# Patient Record
Sex: Male | Born: 1950 | Race: Black or African American | Hispanic: No | Marital: Married | Smoking: Current every day smoker
Health system: Southern US, Community
[De-identification: ages and names within clinical notes are randomized; demographics above are authoritative.]

## PROBLEM LIST (undated history)

## (undated) DIAGNOSIS — R9430 Abnormal result of cardiovascular function study, unspecified: Secondary | ICD-10-CM

## (undated) DIAGNOSIS — E785 Hyperlipidemia, unspecified: Secondary | ICD-10-CM

## (undated) DIAGNOSIS — I700 Atherosclerosis of aorta: Secondary | ICD-10-CM

## (undated) DIAGNOSIS — Z8619 Personal history of other infectious and parasitic diseases: Secondary | ICD-10-CM

## (undated) DIAGNOSIS — I119 Hypertensive heart disease without heart failure: Secondary | ICD-10-CM

## (undated) HISTORY — DX: Hypertensive heart disease without heart failure: I11.9

## (undated) HISTORY — DX: Hyperlipidemia, unspecified: E78.5

## (undated) HISTORY — DX: Atherosclerosis of aorta: I70.0

## (undated) HISTORY — DX: Abnormal result of cardiovascular function study, unspecified: R94.30

## (undated) HISTORY — DX: Personal history of other infectious and parasitic diseases: Z86.19

## (undated) HISTORY — PX: TONSILLECTOMY: SUR1361

---

## 2006-05-01 ENCOUNTER — Ambulatory Visit: Payer: Self-pay | Admitting: Family Medicine

## 2006-05-02 ENCOUNTER — Ambulatory Visit: Payer: Self-pay | Admitting: *Deleted

## 2006-05-22 ENCOUNTER — Ambulatory Visit: Payer: Self-pay | Admitting: Family Medicine

## 2006-07-29 ENCOUNTER — Ambulatory Visit: Payer: Self-pay | Admitting: Internal Medicine

## 2006-11-26 ENCOUNTER — Ambulatory Visit: Payer: Self-pay | Admitting: Family Medicine

## 2006-11-26 ENCOUNTER — Encounter (INDEPENDENT_AMBULATORY_CARE_PROVIDER_SITE_OTHER): Payer: Self-pay | Admitting: Family Medicine

## 2006-11-26 LAB — CONVERTED CEMR LAB
Chloride: 103 meq/L (ref 96–112)
Glucose, Bld: 96 mg/dL (ref 70–99)
Sodium: 140 meq/L (ref 135–145)

## 2007-03-14 ENCOUNTER — Ambulatory Visit: Payer: Self-pay | Admitting: Internal Medicine

## 2007-03-14 ENCOUNTER — Encounter (INDEPENDENT_AMBULATORY_CARE_PROVIDER_SITE_OTHER): Payer: Self-pay | Admitting: Family Medicine

## 2007-03-14 LAB — CONVERTED CEMR LAB
ALT: 37 units/L (ref 0–53)
AST: 63 units/L — ABNORMAL HIGH (ref 0–37)
Alkaline Phosphatase: 50 units/L (ref 39–117)
Basophils Absolute: 0 10*3/uL (ref 0.0–0.1)
CO2: 28 meq/L (ref 19–32)
Chloride: 103 meq/L (ref 96–112)
Creatinine, Ser: 1.3 mg/dL (ref 0.40–1.50)
Glucose, Bld: 90 mg/dL (ref 70–99)
HCT: 43.5 % (ref 39.0–52.0)
Hemoglobin: 14.3 g/dL (ref 13.0–17.0)
Lymphocytes Relative: 35 % (ref 12–46)
MCHC: 32.9 g/dL (ref 30.0–36.0)
MCV: 97.5 fL (ref 78.0–100.0)
Neutro Abs: 2.8 10*3/uL (ref 1.7–7.7)
RBC: 4.46 M/uL (ref 4.22–5.81)
Sodium: 143 meq/L (ref 135–145)
Total Bilirubin: 0.7 mg/dL (ref 0.3–1.2)
Total Protein: 8.6 g/dL — ABNORMAL HIGH (ref 6.0–8.3)
WBC: 5.2 10*3/uL (ref 4.0–10.5)

## 2007-06-30 ENCOUNTER — Ambulatory Visit: Payer: Self-pay | Admitting: Internal Medicine

## 2008-01-01 ENCOUNTER — Ambulatory Visit: Payer: Self-pay | Admitting: Internal Medicine

## 2008-01-01 ENCOUNTER — Encounter (INDEPENDENT_AMBULATORY_CARE_PROVIDER_SITE_OTHER): Payer: Self-pay | Admitting: Adult Health

## 2008-01-01 LAB — CONVERTED CEMR LAB
Bilirubin, Direct: 0.1 mg/dL (ref 0.0–0.3)
HDL: 39 mg/dL — ABNORMAL LOW (ref >39)
Indirect Bilirubin: 0.4 mg/dL (ref 0.0–0.9)
LDL Cholesterol: 70 mg/dL (ref 0–99)
Total CHOL/HDL Ratio: 3.3
Total Protein: 8 g/dL (ref 6.0–8.3)
Triglycerides: 99 mg/dL (ref <150)

## 2008-01-09 ENCOUNTER — Ambulatory Visit: Payer: Self-pay | Admitting: Internal Medicine

## 2008-04-07 ENCOUNTER — Encounter (INDEPENDENT_AMBULATORY_CARE_PROVIDER_SITE_OTHER): Payer: Self-pay | Admitting: Adult Health

## 2008-04-07 ENCOUNTER — Ambulatory Visit: Payer: Self-pay | Admitting: Internal Medicine

## 2008-04-07 LAB — CONVERTED CEMR LAB
ALT: 58 units/L — ABNORMAL HIGH (ref 0–53)
AST: 113 units/L — ABNORMAL HIGH (ref 0–37)
Alkaline Phosphatase: 60 units/L (ref 39–117)
BUN: 17 mg/dL (ref 6–23)
Calcium: 9.2 mg/dL (ref 8.4–10.5)
Chloride: 104 meq/L (ref 96–112)
Glucose, Bld: 95 mg/dL (ref 70–99)
Potassium: 4.8 meq/L (ref 3.5–5.3)
Sodium: 141 meq/L (ref 135–145)
Total Bilirubin: 0.4 mg/dL (ref 0.3–1.2)
Triglycerides: 223 mg/dL — ABNORMAL HIGH (ref <150)
VLDL: 45 mg/dL — ABNORMAL HIGH (ref 0–40)

## 2008-04-28 ENCOUNTER — Ambulatory Visit: Payer: Self-pay | Admitting: Internal Medicine

## 2008-05-06 ENCOUNTER — Ambulatory Visit: Payer: Self-pay | Admitting: Internal Medicine

## 2008-07-05 ENCOUNTER — Ambulatory Visit: Payer: Self-pay | Admitting: Internal Medicine

## 2008-10-06 ENCOUNTER — Ambulatory Visit: Payer: Self-pay | Admitting: Internal Medicine

## 2008-10-06 ENCOUNTER — Encounter (INDEPENDENT_AMBULATORY_CARE_PROVIDER_SITE_OTHER): Payer: Self-pay | Admitting: Adult Health

## 2008-10-06 LAB — CONVERTED CEMR LAB
ALT: 49 units/L (ref 0–53)
AST: 101 units/L — ABNORMAL HIGH (ref 0–37)
Alkaline Phosphatase: 50 units/L (ref 39–117)
BUN: 16 mg/dL (ref 6–23)
CO2: 26 meq/L (ref 19–32)
Chloride: 102 meq/L (ref 96–112)
Hemoglobin: 14.1 g/dL (ref 13.0–17.0)
Lymphs Abs: 1.9 10*3/uL (ref 0.7–4.0)
MCV: 97.3 fL (ref 78.0–100.0)
Monocytes Relative: 11 % (ref 3–12)
Neutrophils Relative %: 44 % (ref 43–77)
Platelets: 212 10*3/uL (ref 150–400)
Potassium: 4.5 meq/L (ref 3.5–5.3)
RBC: 4.38 M/uL (ref 4.22–5.81)
RDW: 14.1 % (ref 11.5–15.5)
Sodium: 138 meq/L (ref 135–145)
Total Protein: 8.1 g/dL (ref 6.0–8.3)
Triglycerides: 117 mg/dL (ref <150)
VLDL: 23 mg/dL (ref 0–40)
WBC: 4.4 10*3/uL (ref 4.0–10.5)

## 2008-10-27 ENCOUNTER — Ambulatory Visit: Payer: Self-pay | Admitting: Internal Medicine

## 2009-02-11 ENCOUNTER — Ambulatory Visit: Payer: Self-pay | Admitting: Internal Medicine

## 2009-02-11 ENCOUNTER — Encounter (INDEPENDENT_AMBULATORY_CARE_PROVIDER_SITE_OTHER): Payer: Self-pay | Admitting: Adult Health

## 2009-02-11 LAB — CONVERTED CEMR LAB
Alkaline Phosphatase: 56 units/L (ref 39–117)
BUN: 17 mg/dL (ref 6–23)
CO2: 26 meq/L (ref 19–32)
Chloride: 103 meq/L (ref 96–112)
Cholesterol: 194 mg/dL (ref 0–200)
Creatinine, Ser: 1.25 mg/dL (ref 0.40–1.50)
Glucose, Bld: 94 mg/dL (ref 70–99)
HDL: 36 mg/dL — ABNORMAL LOW (ref >39)
Hgb A1c MFr Bld: 6.6 % — ABNORMAL HIGH (ref 4.6–6.1)
LDL Cholesterol: 132 mg/dL — ABNORMAL HIGH (ref 0–99)
Sodium: 139 meq/L (ref 135–145)
Total Bilirubin: 0.7 mg/dL (ref 0.3–1.2)
Total CHOL/HDL Ratio: 5.4
Triglycerides: 131 mg/dL (ref <150)

## 2009-02-28 ENCOUNTER — Ambulatory Visit (HOSPITAL_COMMUNITY): Admission: RE | Admit: 2009-02-28 | Discharge: 2009-02-28 | Payer: Self-pay | Admitting: Internal Medicine

## 2009-03-01 ENCOUNTER — Ambulatory Visit: Payer: Self-pay | Admitting: Internal Medicine

## 2009-03-18 ENCOUNTER — Ambulatory Visit: Payer: Self-pay | Admitting: Internal Medicine

## 2009-06-17 ENCOUNTER — Encounter (INDEPENDENT_AMBULATORY_CARE_PROVIDER_SITE_OTHER): Payer: Self-pay | Admitting: Adult Health

## 2009-06-17 ENCOUNTER — Ambulatory Visit: Payer: Self-pay | Admitting: Internal Medicine

## 2009-06-17 LAB — CONVERTED CEMR LAB
ALT: 99 units/L — ABNORMAL HIGH (ref 0–53)
Albumin: 4.1 g/dL (ref 3.5–5.2)
BUN: 17 mg/dL (ref 6–23)
Cholesterol: 199 mg/dL (ref 0–200)
Creatinine, Ser: 1.36 mg/dL (ref 0.40–1.50)
Glucose, Bld: 97 mg/dL (ref 70–99)
HDL: 41 mg/dL (ref >39)
LDL Cholesterol: 131 mg/dL — ABNORMAL HIGH (ref 0–99)
Microalb, Ur: 0.51 mg/dL (ref 0.00–1.89)
PSA: 0.54 ng/mL (ref 0.10–4.00)
Potassium: 4.3 meq/L (ref 3.5–5.3)
Sodium: 136 meq/L (ref 135–145)
Total Bilirubin: 0.8 mg/dL (ref 0.3–1.2)
Triglycerides: 136 mg/dL (ref <150)
VLDL: 27 mg/dL (ref 0–40)

## 2009-06-28 ENCOUNTER — Ambulatory Visit: Payer: Self-pay | Admitting: Internal Medicine

## 2009-06-30 ENCOUNTER — Ambulatory Visit: Payer: Self-pay | Admitting: Family Medicine

## 2009-06-30 ENCOUNTER — Encounter (INDEPENDENT_AMBULATORY_CARE_PROVIDER_SITE_OTHER): Payer: Self-pay | Admitting: Family Medicine

## 2009-06-30 LAB — CONVERTED CEMR LAB
Basophils Absolute: 0 10*3/uL (ref 0.0–0.1)
Basophils Relative: 0 % (ref 0–1)
Eosinophils Relative: 0 % (ref 0–5)
Lymphocytes Relative: 33 % (ref 12–46)
Lymphs Abs: 1.8 10*3/uL (ref 0.7–4.0)
MCHC: 34.2 g/dL (ref 30.0–36.0)
Monocytes Relative: 9 % (ref 3–12)
WBC: 5.3 10*3/uL (ref 4.0–10.5)

## 2010-04-04 ENCOUNTER — Encounter (INDEPENDENT_AMBULATORY_CARE_PROVIDER_SITE_OTHER): Payer: Self-pay | Admitting: *Deleted

## 2010-04-04 LAB — CONVERTED CEMR LAB
ALT: 102 units/L — ABNORMAL HIGH (ref 0–53)
Alkaline Phosphatase: 49 units/L (ref 39–117)
BUN: 17 mg/dL (ref 6–23)
CO2: 26 meq/L (ref 19–32)
Cholesterol: 196 mg/dL (ref 0–200)
Creatinine, Ser: 1.19 mg/dL (ref 0.40–1.50)
Glucose, Bld: 95 mg/dL (ref 70–99)
Sodium: 138 meq/L (ref 135–145)
Total Bilirubin: 0.8 mg/dL (ref 0.3–1.2)
Triglycerides: 101 mg/dL (ref <150)
VLDL: 20 mg/dL (ref 0–40)

## 2012-09-08 ENCOUNTER — Ambulatory Visit: Payer: Self-pay | Admitting: Internal Medicine

## 2012-09-08 DIAGNOSIS — Z0289 Encounter for other administrative examinations: Secondary | ICD-10-CM

## 2014-08-04 ENCOUNTER — Other Ambulatory Visit (HOSPITAL_COMMUNITY): Payer: Self-pay | Admitting: Nurse Practitioner

## 2014-08-04 DIAGNOSIS — B182 Chronic viral hepatitis C: Secondary | ICD-10-CM

## 2014-10-20 ENCOUNTER — Ambulatory Visit (HOSPITAL_COMMUNITY)
Admission: RE | Admit: 2014-10-20 | Discharge: 2014-10-20 | Disposition: A | Discharge status: Home / Self Care | Payer: BLUE CROSS/BLUE SHIELD | Source: Ambulatory Visit | Attending: Nurse Practitioner | Admitting: Nurse Practitioner

## 2014-10-20 DIAGNOSIS — B182 Chronic viral hepatitis C: Secondary | ICD-10-CM | POA: Diagnosis not present

## 2014-11-08 DIAGNOSIS — B182 Chronic viral hepatitis C: Secondary | ICD-10-CM | POA: Diagnosis not present

## 2014-11-08 DIAGNOSIS — K7469 Other cirrhosis of liver: Secondary | ICD-10-CM | POA: Diagnosis not present

## 2014-12-03 DIAGNOSIS — F17210 Nicotine dependence, cigarettes, uncomplicated: Secondary | ICD-10-CM | POA: Diagnosis not present

## 2014-12-03 DIAGNOSIS — E785 Hyperlipidemia, unspecified: Secondary | ICD-10-CM | POA: Diagnosis not present

## 2014-12-03 DIAGNOSIS — Z1389 Encounter for screening for other disorder: Secondary | ICD-10-CM | POA: Diagnosis not present

## 2014-12-03 DIAGNOSIS — Z23 Encounter for immunization: Secondary | ICD-10-CM | POA: Diagnosis not present

## 2014-12-03 DIAGNOSIS — N183 Chronic kidney disease, stage 3 (moderate): Secondary | ICD-10-CM | POA: Diagnosis not present

## 2014-12-03 DIAGNOSIS — I129 Hypertensive chronic kidney disease with stage 1 through stage 4 chronic kidney disease, or unspecified chronic kidney disease: Secondary | ICD-10-CM | POA: Diagnosis not present

## 2014-12-03 DIAGNOSIS — M109 Gout, unspecified: Secondary | ICD-10-CM | POA: Diagnosis not present

## 2014-12-03 DIAGNOSIS — B1920 Unspecified viral hepatitis C without hepatic coma: Secondary | ICD-10-CM | POA: Diagnosis not present

## 2015-01-08 DIAGNOSIS — B182 Chronic viral hepatitis C: Secondary | ICD-10-CM | POA: Diagnosis not present

## 2015-03-05 DIAGNOSIS — B182 Chronic viral hepatitis C: Secondary | ICD-10-CM | POA: Diagnosis not present

## 2015-04-01 ENCOUNTER — Other Ambulatory Visit: Payer: Self-pay | Admitting: Nurse Practitioner

## 2015-04-01 DIAGNOSIS — K7469 Other cirrhosis of liver: Secondary | ICD-10-CM

## 2015-04-08 ENCOUNTER — Ambulatory Visit
Admission: RE | Admit: 2015-04-08 | Discharge: 2015-04-08 | Disposition: A | Discharge status: Home / Self Care | Payer: BLUE CROSS/BLUE SHIELD | Source: Ambulatory Visit | Attending: Nurse Practitioner | Admitting: Nurse Practitioner

## 2015-04-08 DIAGNOSIS — K7469 Other cirrhosis of liver: Secondary | ICD-10-CM

## 2015-04-08 DIAGNOSIS — K8020 Calculus of gallbladder without cholecystitis without obstruction: Secondary | ICD-10-CM | POA: Diagnosis not present

## 2015-05-27 DIAGNOSIS — B182 Chronic viral hepatitis C: Secondary | ICD-10-CM | POA: Diagnosis not present

## 2015-05-31 DIAGNOSIS — B1920 Unspecified viral hepatitis C without hepatic coma: Secondary | ICD-10-CM | POA: Diagnosis not present

## 2015-05-31 DIAGNOSIS — Z1211 Encounter for screening for malignant neoplasm of colon: Secondary | ICD-10-CM | POA: Diagnosis not present

## 2015-05-31 DIAGNOSIS — K7460 Unspecified cirrhosis of liver: Secondary | ICD-10-CM | POA: Diagnosis not present

## 2015-05-31 DIAGNOSIS — I10 Essential (primary) hypertension: Secondary | ICD-10-CM | POA: Diagnosis not present

## 2015-06-22 DIAGNOSIS — Z01818 Encounter for other preprocedural examination: Secondary | ICD-10-CM | POA: Diagnosis not present

## 2015-06-22 DIAGNOSIS — K7460 Unspecified cirrhosis of liver: Secondary | ICD-10-CM | POA: Diagnosis not present

## 2015-06-22 DIAGNOSIS — B1920 Unspecified viral hepatitis C without hepatic coma: Secondary | ICD-10-CM | POA: Diagnosis not present

## 2015-06-22 DIAGNOSIS — Z1211 Encounter for screening for malignant neoplasm of colon: Secondary | ICD-10-CM | POA: Diagnosis not present

## 2015-07-05 DIAGNOSIS — K3189 Other diseases of stomach and duodenum: Secondary | ICD-10-CM | POA: Diagnosis not present

## 2015-07-05 DIAGNOSIS — K5730 Diverticulosis of large intestine without perforation or abscess without bleeding: Secondary | ICD-10-CM | POA: Diagnosis not present

## 2015-07-05 DIAGNOSIS — Z13810 Encounter for screening for upper gastrointestinal disorder: Secondary | ICD-10-CM | POA: Diagnosis not present

## 2015-07-05 DIAGNOSIS — I8500 Esophageal varices without bleeding: Secondary | ICD-10-CM | POA: Diagnosis not present

## 2015-07-05 DIAGNOSIS — K766 Portal hypertension: Secondary | ICD-10-CM | POA: Diagnosis not present

## 2015-07-05 DIAGNOSIS — D122 Benign neoplasm of ascending colon: Secondary | ICD-10-CM | POA: Diagnosis not present

## 2015-07-05 DIAGNOSIS — Z1211 Encounter for screening for malignant neoplasm of colon: Secondary | ICD-10-CM | POA: Diagnosis not present

## 2015-07-05 DIAGNOSIS — D120 Benign neoplasm of cecum: Secondary | ICD-10-CM | POA: Diagnosis not present

## 2015-07-06 DIAGNOSIS — I252 Old myocardial infarction: Secondary | ICD-10-CM | POA: Diagnosis not present

## 2015-07-06 DIAGNOSIS — N183 Chronic kidney disease, stage 3 (moderate): Secondary | ICD-10-CM | POA: Diagnosis not present

## 2015-07-06 DIAGNOSIS — E785 Hyperlipidemia, unspecified: Secondary | ICD-10-CM | POA: Diagnosis not present

## 2015-07-06 DIAGNOSIS — Z23 Encounter for immunization: Secondary | ICD-10-CM | POA: Diagnosis not present

## 2015-07-06 DIAGNOSIS — I2510 Atherosclerotic heart disease of native coronary artery without angina pectoris: Secondary | ICD-10-CM | POA: Diagnosis not present

## 2015-07-06 DIAGNOSIS — I129 Hypertensive chronic kidney disease with stage 1 through stage 4 chronic kidney disease, or unspecified chronic kidney disease: Secondary | ICD-10-CM | POA: Diagnosis not present

## 2015-07-06 DIAGNOSIS — B1920 Unspecified viral hepatitis C without hepatic coma: Secondary | ICD-10-CM | POA: Diagnosis not present

## 2015-08-30 DIAGNOSIS — I2510 Atherosclerotic heart disease of native coronary artery without angina pectoris: Secondary | ICD-10-CM | POA: Diagnosis not present

## 2015-09-08 DIAGNOSIS — I2510 Atherosclerotic heart disease of native coronary artery without angina pectoris: Secondary | ICD-10-CM | POA: Diagnosis not present

## 2015-09-08 DIAGNOSIS — N183 Chronic kidney disease, stage 3 (moderate): Secondary | ICD-10-CM | POA: Diagnosis not present

## 2015-09-08 DIAGNOSIS — E785 Hyperlipidemia, unspecified: Secondary | ICD-10-CM | POA: Diagnosis not present

## 2015-09-08 DIAGNOSIS — I119 Hypertensive heart disease without heart failure: Secondary | ICD-10-CM | POA: Diagnosis not present

## 2015-09-08 DIAGNOSIS — R9431 Abnormal electrocardiogram [ECG] [EKG]: Secondary | ICD-10-CM | POA: Diagnosis not present

## 2015-09-12 DIAGNOSIS — R9431 Abnormal electrocardiogram [ECG] [EKG]: Secondary | ICD-10-CM | POA: Diagnosis not present

## 2015-09-16 ENCOUNTER — Other Ambulatory Visit: Payer: Self-pay | Admitting: Cardiology

## 2015-09-16 ENCOUNTER — Ambulatory Visit
Admission: RE | Admit: 2015-09-16 | Discharge: 2015-09-16 | Disposition: A | Discharge status: Home / Self Care | Payer: BLUE CROSS/BLUE SHIELD | Source: Ambulatory Visit | Attending: Cardiology | Admitting: Cardiology

## 2015-09-16 ENCOUNTER — Encounter: Payer: Self-pay | Admitting: Cardiology

## 2015-09-16 DIAGNOSIS — R079 Chest pain, unspecified: Secondary | ICD-10-CM | POA: Diagnosis not present

## 2015-09-16 DIAGNOSIS — I700 Atherosclerosis of aorta: Secondary | ICD-10-CM

## 2015-09-16 DIAGNOSIS — R0602 Shortness of breath: Secondary | ICD-10-CM | POA: Diagnosis not present

## 2015-09-16 DIAGNOSIS — R9430 Abnormal result of cardiovascular function study, unspecified: Secondary | ICD-10-CM | POA: Insufficient documentation

## 2015-09-16 DIAGNOSIS — N183 Chronic kidney disease, stage 3 (moderate): Secondary | ICD-10-CM | POA: Diagnosis not present

## 2015-09-16 DIAGNOSIS — Z01810 Encounter for preprocedural cardiovascular examination: Secondary | ICD-10-CM | POA: Diagnosis not present

## 2015-09-16 DIAGNOSIS — I119 Hypertensive heart disease without heart failure: Secondary | ICD-10-CM | POA: Insufficient documentation

## 2015-09-16 DIAGNOSIS — I2510 Atherosclerotic heart disease of native coronary artery without angina pectoris: Secondary | ICD-10-CM | POA: Diagnosis not present

## 2015-09-16 DIAGNOSIS — R9431 Abnormal electrocardiogram [ECG] [EKG]: Secondary | ICD-10-CM | POA: Diagnosis not present

## 2015-09-16 DIAGNOSIS — E785 Hyperlipidemia, unspecified: Secondary | ICD-10-CM

## 2015-09-16 DIAGNOSIS — Z8619 Personal history of other infectious and parasitic diseases: Secondary | ICD-10-CM

## 2015-09-16 HISTORY — DX: Atherosclerosis of aorta: I70.0

## 2015-09-16 HISTORY — DX: Personal history of other infectious and parasitic diseases: Z86.19

## 2015-09-16 HISTORY — DX: Abnormal result of cardiovascular function study, unspecified: R94.30

## 2015-09-16 HISTORY — DX: Hyperlipidemia, unspecified: E78.5

## 2015-09-16 HISTORY — DX: Hypertensive heart disease without heart failure: I11.9

## 2015-09-16 NOTE — Progress Notes (Signed)
Keith Nguyen    Date of visit:  09/16/2015 DOB:  1950-12-20    Age:  65 yrs. Medical record number:  [id number]     Account number:  [id number] Primary Care Provider: [name] ____________________________ CURRENT DIAGNOSES  1. Abnormal EKG  2. Encounter for preprocedural cardiovascular examination  3. Hypertensive heart disease without heart failure  4. CAD Native without angina  5. Chronic kidney disease, stage 3 (moderate)  6. Hyperlipidemia  7. Abnormal electrocardiogram [ECG] [EKG] ____________________________ ALLERGIES  Lipitor, Elevated lfts ____________________________ MEDICATIONS  1. allopurinol 100 mg tablet, 1 p.o. daily  2. metoprolol succinate ER 200 mg tablet,extended release 24 hr, 1 p.o. daily  3. furosemide 40 mg tablet, 1 p.o. daily  4. losartan 100 mg tablet, 1 p.o. daily  5. Fish Oil 1,000 mg (120 mg-180 mg) capsule, 2 p.o. b.i.d.  6. aspirin 81 mg chewable tablet, 1 p.o. daily ____________________________ CHIEF COMPLAINTS  preop eval ____________________________ HISTORY OF PRESENT ILLNESS Patient seen for pre-catheter evaluation. The patient has history of hypertension hyperlipidemia and hypertensive chronic disease. He has a history of hepatitis C previously treated. He was seen for preoperative cardiac evaluation recently prior to knee surgery because he was found to have an abnormal EKG. At that time a myocardial perfusion scan showed a previous posterolateral infarct with superior infarction ischemia with a low ejection fraction. This was confirmed by an echo that showed hypokinesis of the posterolateral wall with an ejection fraction of 40%.  Moderate LVH. He doesn't have a lot in the way of chest pain but does have some dyspnea. He appeared fairly healthy otherwise but is multiple cardiovascular risk factors. He has a history of atherosclerotic disease of his aorta noted on chest x-ray. ____________________________ PAST HISTORY  Past Medical Illnesses:   hyperlipidemia, hypertensive chronic kidney disease Stage 3, Hepatitis C, gout, cirrhosis, DM-diet controlled;  Cardiovascular Illnesses:  CAD, myocardial infarction;  Infectious Diseases:  no previous history of significant infectious diseases;  Surgical Procedures:  tonsillectomy;  Trauma History:  no previous history of significant trauma;  NYHA Classification:  I;  Canadian Angina Classification:  Class 0: Asymptomatic;  Cardiology Procedures-Invasive:  no previous interventional or invasive cardiology procedures;  Cardiology Procedures-Noninvasive:  treadmill Myoview August 2017, echocardiogram August 2017;  Peripheral Vascular Procedures:  no previous invasive peripheral vascular procedures.;  LVEF of 40% documented via echocardiogram on 09/12/2015,   ____________________________ CARDIO-PULMONARY TEST DATES EKG Date:  08/30/2015;  Nuclear Study Date:  09/08/2015;   ____________________________ FAMILY HISTORY Brother -- Brother alive and well Brother -- Brother alive and well Father -- Father dead, Malignant neoplasm of lung Mother -- Mother dead, Stroke Sister -- Sister alive with problem, Hypertension ____________________________ SOCIAL HISTORY Alcohol Use:  beer and 8 drinks per week;  Smoking:  smokes cigarettes,, 20 pack year history;  Diet:  regular diet;  Lifestyle:  married;  Exercise:  walking 1-2 days per week;  Occupation:  reitred from [employer] work;  Residence:  lives with daughter;   ____________________________ REVIEW OF SYSTEMS General:  denies recent weight change, fatique or change in exercise tolerance.  Integumentary:no rashes or new skin lesions. Eyes: denies diplopia, history of glaucoma or visual problems. Ears, Nose, Throat, Mouth:  denies any hearing loss, epistaxis, hoarseness or difficulty speaking. Respiratory: denies dyspnea, cough, wheezing or hemoptysis. Cardiovascular:  please review HPI Abdominal: denies dyspepsia, GI bleeding, constipation, or diarrhea  Genitourinary-Male: nocturia  Musculoskeletal:  denies arthritis, venous insufficiency, or muscle weakness Neurological:  denies headaches, stroke, or TIA Psychiatric:  denies depression or anxiety Hematological/Immunologic:  denies any food allergies, bleeding disorders. ____________________________ PHYSICAL EXAMINATION VITAL SIGNS  Blood Pressure:  130/90 Sitting, Right arm, large cuff  , 134/90 Standing, Right arm and large cuff   Pulse:  68/min. Weight:  187.00 lbs. Height:  71"BMI: 26  Constitutional:  pleasant African Americian male in no acute distress Skin:  warm and dry to touch, no apparent skin lesions, or masses noted. Head:  normocephalic, normal hair pattern, no masses or tenderness Eyes:  EOMS Intact, PERRLA, C and S clear, Funduscopic exam not done. ENT:  ears, nose and throat reveal no gross abnormalities.  Dentition good. Neck:  supple, without massess. No JVD, thyromegaly or carotid bruits. Carotid upstroke normal. Chest:  normal symmetry, clear to auscultation. Cardiac:  regular rhythm, normal S1 and S2, No S3 or S4, no murmurs, gallops or rubs detected. Abdomen:  abdomen soft,non-tender, no masses, no hepatospenomegaly, or aneurysm noted Peripheral Pulses:  the femoral,dorsalis pedis, and posterior tibial pulses are full and equal bilaterally with no bruits auscultated. Extremities & Back:  no deformities, clubbing, cyanosis, erythema or edema observed. Normal muscle strength and tone. Neurological:  no gross motor or sensory deficits noted, affect appropriate, oriented x3. ____________________________ MOST RECENT LIPID PANEL 12/03/14  CHOL TOTL 187 mg/dl, LDL 109 NM, HDL 43 mg/dl and TRIGLYCER 85 mg/dl ____________________________ IMPRESSIONS/PLAN 1. Abnormal myocardial perfusion scan and high risk study in the setting of a likely previous infarction 2. Hypertensive heart disease 3. Tobacco abuse 4. Hyperlipidemia very mild currently not  treated  Recommendations:  Chest x-ray today shows aortic atherosclerosis. In light of the abnormal LV function and myocardial perfusion scan would like him to undergo cardiac catheterization. Cardiac catheterization was discussed with the patient including risks of myocardial infarction, death, stroke, bleeding, arrhythmia, dye allergy, or renal insufficiency. He understands and is willing to proceed. Possibility of percutaneous intervention at the same setting was also discussed with the patient including risks. We will make arrangements for this be done through [facility]. Lab work evaluated today. Patient is agreeable to having this done.  ____________________________ TODAYS ORDERS  1. Comprehensive Metabolic Panel: Today  2. Complete Blood Count: Today  3. Draw PT/INR: Today  4. PTT: Today  5. Chest X-ray PA/Lat: today  6. Cardiac cath                        ____________________________ Cardiology Physician:  Kerry Hough MD [facility]

## 2015-09-22 ENCOUNTER — Encounter (HOSPITAL_COMMUNITY): Payer: Self-pay | Admitting: *Deleted

## 2015-09-22 ENCOUNTER — Encounter (HOSPITAL_COMMUNITY)
Admission: RE | Disposition: A | Discharge status: Home / Self Care | Payer: Self-pay | Source: Ambulatory Visit | Attending: Interventional Cardiology

## 2015-09-22 ENCOUNTER — Ambulatory Visit (HOSPITAL_COMMUNITY)
Admission: RE | Admit: 2015-09-22 | Discharge: 2015-09-22 | Disposition: A | Discharge status: Home / Self Care | Payer: BLUE CROSS/BLUE SHIELD | Source: Ambulatory Visit | Attending: Interventional Cardiology | Admitting: Interventional Cardiology

## 2015-09-22 DIAGNOSIS — I429 Cardiomyopathy, unspecified: Secondary | ICD-10-CM | POA: Diagnosis not present

## 2015-09-22 DIAGNOSIS — R931 Abnormal findings on diagnostic imaging of heart and coronary circulation: Secondary | ICD-10-CM | POA: Diagnosis not present

## 2015-09-22 DIAGNOSIS — Z8249 Family history of ischemic heart disease and other diseases of the circulatory system: Secondary | ICD-10-CM | POA: Insufficient documentation

## 2015-09-22 DIAGNOSIS — F17210 Nicotine dependence, cigarettes, uncomplicated: Secondary | ICD-10-CM | POA: Insufficient documentation

## 2015-09-22 DIAGNOSIS — E785 Hyperlipidemia, unspecified: Secondary | ICD-10-CM | POA: Insufficient documentation

## 2015-09-22 DIAGNOSIS — I2510 Atherosclerotic heart disease of native coronary artery without angina pectoris: Secondary | ICD-10-CM | POA: Insufficient documentation

## 2015-09-22 DIAGNOSIS — I252 Old myocardial infarction: Secondary | ICD-10-CM | POA: Diagnosis not present

## 2015-09-22 DIAGNOSIS — E1122 Type 2 diabetes mellitus with diabetic chronic kidney disease: Secondary | ICD-10-CM | POA: Diagnosis not present

## 2015-09-22 DIAGNOSIS — Z823 Family history of stroke: Secondary | ICD-10-CM | POA: Insufficient documentation

## 2015-09-22 DIAGNOSIS — I1310 Hypertensive heart and chronic kidney disease without heart failure, with stage 1 through stage 4 chronic kidney disease, or unspecified chronic kidney disease: Secondary | ICD-10-CM | POA: Insufficient documentation

## 2015-09-22 DIAGNOSIS — Z7982 Long term (current) use of aspirin: Secondary | ICD-10-CM | POA: Insufficient documentation

## 2015-09-22 DIAGNOSIS — Z801 Family history of malignant neoplasm of trachea, bronchus and lung: Secondary | ICD-10-CM | POA: Diagnosis not present

## 2015-09-22 DIAGNOSIS — R9439 Abnormal result of other cardiovascular function study: Secondary | ICD-10-CM | POA: Diagnosis present

## 2015-09-22 DIAGNOSIS — N183 Chronic kidney disease, stage 3 (moderate): Secondary | ICD-10-CM | POA: Diagnosis not present

## 2015-09-22 DIAGNOSIS — K7460 Unspecified cirrhosis of liver: Secondary | ICD-10-CM | POA: Insufficient documentation

## 2015-09-22 DIAGNOSIS — M109 Gout, unspecified: Secondary | ICD-10-CM | POA: Insufficient documentation

## 2015-09-22 HISTORY — PX: CARDIAC CATHETERIZATION: SHX172

## 2015-09-22 LAB — PROTIME-INR
INR: 1.09
PROTHROMBIN TIME: 14.2 s (ref 11.4–15.2)

## 2015-09-22 LAB — BASIC METABOLIC PANEL
Anion gap: 6 (ref 5–15)
BUN: 15 mg/dL (ref 6–20)
CALCIUM: 9.1 mg/dL (ref 8.9–10.3)
CO2: 22 mmol/L (ref 22–32)
CREATININE: 1.31 mg/dL — AB (ref 0.61–1.24)
Chloride: 106 mmol/L (ref 101–111)
GFR calc Af Amer: >60 mL/min (ref >60)
GFR, EST NON AFRICAN AMERICAN: 55 mL/min — AB (ref >60)
GLUCOSE: 102 mg/dL — AB (ref 65–99)
Potassium: 4 mmol/L (ref 3.5–5.1)
SODIUM: 134 mmol/L — AB (ref 135–145)

## 2015-09-22 LAB — CBC
HCT: 47 % (ref 39.0–52.0)
Hemoglobin: 16 g/dL (ref 13.0–17.0)
MCH: 32.3 pg (ref 26.0–34.0)
MCHC: 34 g/dL (ref 30.0–36.0)
MCV: 94.8 fL (ref 78.0–100.0)
PLATELETS: 148 10*3/uL — AB (ref 150–400)
RBC: 4.96 MIL/uL (ref 4.22–5.81)
RDW: 14.3 % (ref 11.5–15.5)
WBC: 5.4 10*3/uL (ref 4.0–10.5)

## 2015-09-22 LAB — POCT ACTIVATED CLOTTING TIME
ACTIVATED CLOTTING TIME: 208 s
Activated Clotting Time: 169 s

## 2015-09-22 LAB — GLUCOSE, CAPILLARY: Glucose-Capillary: 99 mg/dL (ref 65–99)

## 2015-09-22 SURGERY — LEFT HEART CATH AND CORONARY ANGIOGRAPHY

## 2015-09-22 MED ORDER — HYDRALAZINE HCL 20 MG/ML IJ SOLN
INTRAMUSCULAR | Status: AC
  Filled 2015-09-22: qty 1

## 2015-09-22 MED ORDER — HYDRALAZINE HCL 25 MG PO TABS
25.0000 mg | ORAL_TABLET | Freq: Three times a day (TID) | ORAL | Status: DC
  Administered 2015-09-22: 25 mg via ORAL
  Filled 2015-09-22 (×3): qty 1

## 2015-09-22 MED ORDER — DIAZEPAM 5 MG PO TABS: 5.0000 mg | ORAL_TABLET | ORAL | Status: DC | PRN

## 2015-09-22 MED ORDER — SODIUM CHLORIDE 0.9 % IV SOLN: INTRAVENOUS | Status: AC

## 2015-09-22 MED ORDER — SODIUM CHLORIDE 0.9% FLUSH: 3.0000 mL | Freq: Two times a day (BID) | INTRAVENOUS | Status: DC

## 2015-09-22 MED ORDER — FENTANYL CITRATE (PF) 100 MCG/2ML IJ SOLN
INTRAMUSCULAR | Status: AC
  Filled 2015-09-22: qty 2

## 2015-09-22 MED ORDER — HEPARIN SODIUM (PORCINE) 1000 UNIT/ML IJ SOLN
INTRAMUSCULAR | Status: AC
  Filled 2015-09-22: qty 1

## 2015-09-22 MED ORDER — VERAPAMIL HCL 2.5 MG/ML IV SOLN
INTRAVENOUS | Status: AC
  Filled 2015-09-22: qty 2

## 2015-09-22 MED ORDER — SODIUM CHLORIDE 0.9% FLUSH: 3.0000 mL | INTRAVENOUS | Status: DC | PRN

## 2015-09-22 MED ORDER — HYDRALAZINE HCL 20 MG/ML IJ SOLN
10.0000 mg | INTRAMUSCULAR | Status: DC | PRN
  Administered 2015-09-22: 10 mg via INTRAVENOUS

## 2015-09-22 MED ORDER — NITROGLYCERIN 1 MG/10 ML FOR IR/CATH LAB
INTRA_ARTERIAL | Status: AC
  Filled 2015-09-22: qty 10

## 2015-09-22 MED ORDER — SODIUM CHLORIDE 0.9 % IV SOLN: 250.0000 mL | INTRAVENOUS | Status: DC | PRN

## 2015-09-22 MED ORDER — ONDANSETRON HCL 4 MG/2ML IJ SOLN: 4.0000 mg | Freq: Four times a day (QID) | INTRAMUSCULAR | Status: DC | PRN

## 2015-09-22 MED ORDER — HYDRALAZINE HCL 20 MG/ML IJ SOLN: 10.0000 mg | Freq: Once | INTRAMUSCULAR | Status: DC

## 2015-09-22 MED ORDER — MIDAZOLAM HCL 2 MG/2ML IJ SOLN
INTRAMUSCULAR | Status: AC
  Filled 2015-09-22: qty 2

## 2015-09-22 MED ORDER — HEPARIN (PORCINE) IN NACL 2-0.9 UNIT/ML-% IJ SOLN
INTRAMUSCULAR | Status: DC | PRN
  Administered 2015-09-22: 1000 mL

## 2015-09-22 MED ORDER — HEPARIN SODIUM (PORCINE) 1000 UNIT/ML IJ SOLN
INTRAMUSCULAR | Status: DC | PRN
  Administered 2015-09-22: 4200 [IU] via INTRAVENOUS

## 2015-09-22 MED ORDER — SODIUM CHLORIDE 0.9 % WEIGHT BASED INFUSION: 1.0000 mL/kg/h | INTRAVENOUS | Status: DC

## 2015-09-22 MED ORDER — IOPAMIDOL (ISOVUE-370) INJECTION 76%
INTRAVENOUS | Status: DC | PRN
  Administered 2015-09-22: 80 mL via INTRA_ARTERIAL

## 2015-09-22 MED ORDER — HEPARIN (PORCINE) IN NACL 2-0.9 UNIT/ML-% IJ SOLN
INTRAMUSCULAR | Status: AC
  Filled 2015-09-22: qty 1000

## 2015-09-22 MED ORDER — SODIUM CHLORIDE 0.9 % WEIGHT BASED INFUSION
3.0000 mL/kg/h | INTRAVENOUS | Status: AC
  Administered 2015-09-22: 3 mL/kg/h via INTRAVENOUS

## 2015-09-22 MED ORDER — IOPAMIDOL (ISOVUE-370) INJECTION 76%
INTRAVENOUS | Status: AC
  Filled 2015-09-22: qty 100

## 2015-09-22 MED ORDER — LIDOCAINE HCL (PF) 1 % IJ SOLN
INTRAMUSCULAR | Status: AC
  Filled 2015-09-22: qty 30

## 2015-09-22 MED ORDER — ASPIRIN 81 MG PO CHEW: 81.0000 mg | CHEWABLE_TABLET | ORAL | Status: DC

## 2015-09-22 MED ORDER — ACETAMINOPHEN 325 MG PO TABS: 650.0000 mg | ORAL_TABLET | ORAL | Status: DC | PRN

## 2015-09-22 MED ORDER — ASPIRIN 81 MG PO CHEW: 81.0000 mg | CHEWABLE_TABLET | Freq: Every day | ORAL | Status: DC

## 2015-09-22 MED ORDER — FENTANYL CITRATE (PF) 100 MCG/2ML IJ SOLN
INTRAMUSCULAR | Status: DC | PRN
  Administered 2015-09-22: 50 ug via INTRAVENOUS

## 2015-09-22 MED ORDER — VERAPAMIL HCL 2.5 MG/ML IV SOLN
INTRAVENOUS | Status: DC | PRN
  Administered 2015-09-22: 100 mL via INTRA_ARTERIAL

## 2015-09-22 MED ORDER — LIDOCAINE HCL (PF) 1 % IJ SOLN
INTRAMUSCULAR | Status: DC | PRN
  Administered 2015-09-22: 15 mL via INTRADERMAL
  Administered 2015-09-22: 2 mL via INTRADERMAL

## 2015-09-22 MED ORDER — HYDRALAZINE HCL 20 MG/ML IJ SOLN
10.0000 mg | Freq: Once | INTRAMUSCULAR | Status: AC
  Administered 2015-09-22: 10 mg via INTRAVENOUS

## 2015-09-22 MED ORDER — MIDAZOLAM HCL 2 MG/2ML IJ SOLN
INTRAMUSCULAR | Status: DC | PRN
  Administered 2015-09-22: 2 mg via INTRAVENOUS

## 2015-09-22 SURGICAL SUPPLY — 14 items
CATH EXPO 5FR FR4 (CATHETERS) ×2 IMPLANT
CATH INFINITI 5FR ANG PIGTAIL (CATHETERS) ×2 IMPLANT
CATH INFINITI 5FR JL4 (CATHETERS) ×2 IMPLANT
CATH INFINITI 5FR JL5 (CATHETERS) ×2 IMPLANT
CATH SITESEER 5F NTR (CATHETERS) ×2 IMPLANT
DEVICE RAD COMP TR BAND LRG (VASCULAR PRODUCTS) ×2 IMPLANT
GLIDESHEATH SLEND SS 6F .021 (SHEATH) ×2 IMPLANT
KIT HEART LEFT (KITS) ×3 IMPLANT
PACK CARDIAC CATHETERIZATION (CUSTOM PROCEDURE TRAY) ×3 IMPLANT
SHEATH PINNACLE 5F 10CM (SHEATH) ×2 IMPLANT
SYR MEDRAD MARK V 150ML (SYRINGE) ×3 IMPLANT
TRANSDUCER W/STOPCOCK (MISCELLANEOUS) ×3 IMPLANT
TUBING CIL FLEX 10 FLL-RA (TUBING) ×3 IMPLANT
WIRE SAFE-T 1.5MM-J .035X260CM (WIRE) ×2 IMPLANT

## 2015-09-22 NOTE — Interval H&P Note (Signed)
Cath Lab Visit (complete for each Cath Lab visit)  Clinical Evaluation Leading to the Procedure:   ACS: No.  Non-ACS:    Anginal Classification: CCS II  Anti-ischemic medical therapy: Maximal Therapy (2 or more classes of medications)  Non-Invasive Test Results: Intermediate-risk stress test findings: cardiac mortality 1-3%/year  Prior CABG: No previous CABG      History and Physical Interval Note:  09/22/2015 8:58 AM  Paulette Blanch.  has presented today for surgery, with the diagnosis of cp  The various methods of treatment have been discussed with the patient and family. After consideration of risks, benefits and other options for treatment, the patient has consented to  Procedure(s): Left Heart Cath and Coronary Angiography (N/A) as a surgical intervention .  The patient's history has been reviewed, patient examined, no change in status, stable for surgery.  I have reviewed the patient's chart and labs.  Questions were answered to the patient's satisfaction.     Shelva Majestic

## 2015-09-22 NOTE — Progress Notes (Addendum)
Site area: RFA Site Prior to Removal:  Level 0 Pressure Applied For:82min Manual:   yes Patient Status During Pull:  stable Post Pull Site:  Level 1 Post Pull Instructions Given: yes  Post Pull Pulses Present: palpable Dressing Applied:  tegaderm Bedrest begins @ [id number] Comments:Removed by [employer] by [employer].  Raised area , soft to palpation approx 4x3 in below and lateral to insertion site noted and marked. No additional growth noted after 15 min observation.

## 2015-09-22 NOTE — Progress Notes (Signed)
bp med ordered/ given

## 2015-09-22 NOTE — H&P (View-Only) (Signed)
Keith Nguyen    Date of visit:  09/16/2015 DOB:  12/31/1950    Age:  65 yrs. Medical record number:  [id number]     Account number:  [id number] Primary Care Provider: [name] ____________________________ CURRENT DIAGNOSES  1. Abnormal EKG  2. Encounter for preprocedural cardiovascular examination  3. Hypertensive heart disease without heart failure  4. CAD Native without angina  5. Chronic kidney disease, stage 3 (moderate)  6. Hyperlipidemia  7. Abnormal electrocardiogram [ECG] [EKG] ____________________________ ALLERGIES  Lipitor, Elevated lfts ____________________________ MEDICATIONS  1. allopurinol 100 mg tablet, 1 p.o. daily  2. metoprolol succinate ER 200 mg tablet,extended release 24 hr, 1 p.o. daily  3. furosemide 40 mg tablet, 1 p.o. daily  4. losartan 100 mg tablet, 1 p.o. daily  5. Fish Oil 1,000 mg (120 mg-180 mg) capsule, 2 p.o. b.i.d.  6. aspirin 81 mg chewable tablet, 1 p.o. daily ____________________________ CHIEF COMPLAINTS  preop eval ____________________________ HISTORY OF PRESENT ILLNESS Patient seen for pre-catheter evaluation. The patient has history of hypertension hyperlipidemia and hypertensive chronic disease. He has a history of hepatitis C previously treated. He was seen for preoperative cardiac evaluation recently prior to knee surgery because he was found to have an abnormal EKG. At that time a myocardial perfusion scan showed a previous posterolateral infarct with superior infarction ischemia with a low ejection fraction. This was confirmed by an echo that showed hypokinesis of the posterolateral wall with an ejection fraction of 40%.  Moderate LVH. He doesn't have a lot in the way of chest pain but does have some dyspnea. He appeared fairly healthy otherwise but is multiple cardiovascular risk factors. He has a history of atherosclerotic disease of his aorta noted on chest x-ray. ____________________________ PAST HISTORY  Past Medical Illnesses:   hyperlipidemia, hypertensive chronic kidney disease Stage 3, Hepatitis C, gout, cirrhosis, DM-diet controlled;  Cardiovascular Illnesses:  CAD, myocardial infarction;  Infectious Diseases:  no previous history of significant infectious diseases;  Surgical Procedures:  tonsillectomy;  Trauma History:  no previous history of significant trauma;  NYHA Classification:  I;  Canadian Angina Classification:  Class 0: Asymptomatic;  Cardiology Procedures-Invasive:  no previous interventional or invasive cardiology procedures;  Cardiology Procedures-Noninvasive:  treadmill Myoview August 2017, echocardiogram August 2017;  Peripheral Vascular Procedures:  no previous invasive peripheral vascular procedures.;  LVEF of 40% documented via echocardiogram on 09/12/2015,   ____________________________ CARDIO-PULMONARY TEST DATES EKG Date:  08/30/2015;  Nuclear Study Date:  09/08/2015;   ____________________________ FAMILY HISTORY Brother -- Brother alive and well Brother -- Brother alive and well Father -- Father dead, Malignant neoplasm of lung Mother -- Mother dead, Stroke Sister -- Sister alive with problem, Hypertension ____________________________ SOCIAL HISTORY Alcohol Use:  beer and 8 drinks per week;  Smoking:  smokes cigarettes,, 20 pack year history;  Diet:  regular diet;  Lifestyle:  married;  Exercise:  walking 1-2 days per week;  Occupation:  reitred from Proofreader work;  Residence:  lives with daughter;   ____________________________ REVIEW OF SYSTEMS General:  denies recent weight change, fatique or change in exercise tolerance.  Integumentary:no rashes or new skin lesions. Eyes: denies diplopia, history of glaucoma or visual problems. Ears, Nose, Throat, Mouth:  denies any hearing loss, epistaxis, hoarseness or difficulty speaking. Respiratory: denies dyspnea, cough, wheezing or hemoptysis. Cardiovascular:  please review HPI Abdominal: denies dyspepsia, GI bleeding, constipation, or diarrhea  Genitourinary-Male: nocturia  Musculoskeletal:  denies arthritis, venous insufficiency, or muscle weakness Neurological:  denies headaches, stroke, or TIA Psychiatric:  denies depression or anxiety Hematological/Immunologic:  denies any food allergies, bleeding disorders. ____________________________ PHYSICAL EXAMINATION VITAL SIGNS  Blood Pressure:  130/90 Sitting, Right arm, large cuff  , 134/90 Standing, Right arm and large cuff   Pulse:  68/min. Weight:  187.00 lbs. Height:  71"BMI: 26  Constitutional:  pleasant African Americian male in no acute distress Skin:  warm and dry to touch, no apparent skin lesions, or masses noted. Head:  normocephalic, normal hair pattern, no masses or tenderness Eyes:  EOMS Intact, PERRLA, C and S clear, Funduscopic exam not done. ENT:  ears, nose and throat reveal no gross abnormalities.  Dentition good. Neck:  supple, without massess. No JVD, thyromegaly or carotid bruits. Carotid upstroke normal. Chest:  normal symmetry, clear to auscultation. Cardiac:  regular rhythm, normal S1 and S2, No S3 or S4, no murmurs, gallops or rubs detected. Abdomen:  abdomen soft,non-tender, no masses, no hepatospenomegaly, or aneurysm noted Peripheral Pulses:  the femoral,dorsalis pedis, and posterior tibial pulses are full and equal bilaterally with no bruits auscultated. Extremities & Back:  no deformities, clubbing, cyanosis, erythema or edema observed. Normal muscle strength and tone. Neurological:  no gross motor or sensory deficits noted, affect appropriate, oriented x3. ____________________________ MOST RECENT LIPID PANEL 12/03/14  CHOL TOTL 187 mg/dl, LDL 109 NM, HDL 43 mg/dl and TRIGLYCER 85 mg/dl ____________________________ IMPRESSIONS/PLAN 1. Abnormal myocardial perfusion scan and high risk study in the setting of a likely previous infarction 2. Hypertensive heart disease 3. Tobacco abuse 4. Hyperlipidemia very mild currently not  treated  Recommendations:  Chest x-ray today shows aortic atherosclerosis. In light of the abnormal LV function and myocardial perfusion scan would like him to undergo cardiac catheterization. Cardiac catheterization was discussed with the patient including risks of myocardial infarction, death, stroke, bleeding, arrhythmia, dye allergy, or renal insufficiency. He understands and is willing to proceed. Possibility of percutaneous intervention at the same setting was also discussed with the patient including risks. We will make arrangements for this be done through [facility]. Lab work evaluated today. Patient is agreeable to having this done.  ____________________________ TODAYS ORDERS  1. Comprehensive Metabolic Panel: Today  2. Complete Blood Count: Today  3. Draw PT/INR: Today  4. PTT: Today  5. Chest X-ray PA/Lat: today  6. Cardiac cath                        ____________________________ Cardiology Physician:  Kerry Hough MD [facility]

## 2015-09-22 NOTE — Discharge Instructions (Signed)
Radial Site Care Refer to this sheet in the next few weeks. These instructions provide you with information about caring for yourself after your procedure. Your health care provider may also give you more specific instructions. Your treatment has been planned according to current medical practices, but problems sometimes occur. Call your health care provider if you have any problems or questions after your procedure. WHAT TO EXPECT AFTER THE PROCEDURE After your procedure, it is typical to have the following:  Bruising at the radial site that usually fades within 1-2 weeks.  Blood collecting in the tissue (hematoma) that may be painful to the touch. It should usually decrease in size and tenderness within 1-2 weeks. HOME CARE INSTRUCTIONS  Take medicines only as directed by your health care provider.  You may shower 24-48 hours after the procedure or as directed by your health care provider. Remove the bandage (dressing) and gently wash the site with plain soap and water. Pat the area dry with a clean towel. Do not rub the site, because this may cause bleeding.  Do not take baths, swim, or use a hot tub until your health care provider approves.  Check your insertion site every day for redness, swelling, or drainage.  Do not apply powder or lotion to the site.  Do not flex or bend the affected arm for 24 hours or as directed by your health care provider.  Do not push or pull heavy objects with the affected arm for 24 hours or as directed by your health care provider.  Do not lift over 10 lb (4.5 kg) for 5 days after your procedure or as directed by your health care provider.  Ask your health care provider when it is okay to:  Return to work or school.  Resume usual physical activities or sports.  Resume sexual activity.  Do not drive home if you are discharged the same day as the procedure. Have someone else drive you.  You may drive 24 hours after the procedure unless otherwise  instructed by your health care provider.  Do not operate machinery or power tools for 24 hours after the procedure.  If your procedure was done as an outpatient procedure, which means that you went home the same day as your procedure, a responsible adult should be with you for the first 24 hours after you arrive home.  Keep all follow-up visits as directed by your health care provider. This is important. SEEK MEDICAL CARE IF:  You have a fever.  You have chills.  You have increased bleeding from the radial site. Hold pressure on the site. CALL 911 SEEK IMMEDIATE MEDICAL CARE IF:  You have unusual pain at the radial site.  You have redness, warmth, or swelling at the radial site.  You have drainage (other than a small amount of blood on the dressing) from the radial site.  The radial site is bleeding, and the bleeding does not stop after 30 minutes of holding steady pressure on the site.  Your arm or hand becomes pale, cool, tingly, or numb.   This information is not intended to replace advice given to you by your health care provider. Make sure you discuss any questions you have with your health care provider.   Document Released: 02/10/2010 Document Revised: 01/29/2014 Document Reviewed: 07/27/2013 Elsevier Interactive Patient Education 2016 [employer] After Refer to this sheet in the next few weeks. These instructions provide you with information about caring for yourself after your procedure. Your health  care provider may also give you more specific instructions. Your treatment has been planned according to current medical practices, but problems sometimes occur. Call your health care provider if you have any problems or questions after your procedure. WHAT TO EXPECT AFTER THE PROCEDURE After your procedure, it is typical to have the following:  Bruising at the catheter insertion site that usually fades within 1-2 weeks.  Blood collecting in the tissue  (hematoma) that may be painful to the touch. It should usually decrease in size and tenderness within 1-2 weeks. HOME CARE INSTRUCTIONS  Take medicines only as directed by your health care provider.  You may shower 24-48 hours after the procedure or as directed by your health care provider. Remove the bandage (dressing) and gently wash the site with plain soap and water. Pat the area dry with a clean towel. Do not rub the site, because this may cause bleeding.  Do not take baths, swim, or use a hot tub until your health care provider approves.  Check your insertion site every day for redness, swelling, or drainage.  Do not apply powder or lotion to the site.  Do not lift over 10 lb (4.5 kg) for 5 days after your procedure or as directed by your health care provider.  Ask your health care provider when it is okay to:  Return to work or school.  Resume usual physical activities or sports.  Resume sexual activity.  Do not drive home if you are discharged the same day as the procedure. Have someone else drive you.  You may drive 24 hours after the procedure unless otherwise instructed by your health care provider.  Do not operate machinery or power tools for 24 hours after the procedure or as directed by your health care provider.  If your procedure was done as an outpatient procedure, which means that you went home the same day as your procedure, a responsible adult should be with you for the first 24 hours after you arrive home.  Keep all follow-up visits as directed by your health care provider. This is important. SEEK MEDICAL CARE IF:  You have a fever.  You have chills.  You have increased bleeding from the catheter insertion site. Hold pressure on the site. CALL 911 SEEK IMMEDIATE MEDICAL CARE IF:  You have unusual pain at the catheter insertion site.  You have redness, warmth, or swelling at the catheter insertion site.  You have drainage (other than a small amount of  blood on the dressing) from the catheter insertion site.  The catheter insertion site is bleeding, and the bleeding does not stop after 30 minutes of holding steady pressure on the site.  The area near or just beyond the catheter insertion site becomes pale, cool, tingly, or numb.   This information is not intended to replace advice given to you by your health care provider. Make sure you discuss any questions you have with your health care provider.   Document Released: 07/27/2004 Document Revised: 01/29/2014 Document Reviewed: 06/11/2012 Elsevier Interactive Patient Education [employer].

## 2015-09-23 ENCOUNTER — Encounter (HOSPITAL_COMMUNITY): Payer: Self-pay | Admitting: Cardiovascular Disease

## 2015-09-23 DIAGNOSIS — B182 Chronic viral hepatitis C: Secondary | ICD-10-CM | POA: Diagnosis not present

## 2015-09-30 DIAGNOSIS — R9431 Abnormal electrocardiogram [ECG] [EKG]: Secondary | ICD-10-CM | POA: Diagnosis not present

## 2015-09-30 DIAGNOSIS — I2510 Atherosclerotic heart disease of native coronary artery without angina pectoris: Secondary | ICD-10-CM | POA: Diagnosis not present

## 2015-10-03 DIAGNOSIS — B182 Chronic viral hepatitis C: Secondary | ICD-10-CM | POA: Diagnosis not present

## 2015-10-03 DIAGNOSIS — K740 Hepatic fibrosis: Secondary | ICD-10-CM | POA: Diagnosis not present

## 2015-10-06 ENCOUNTER — Other Ambulatory Visit: Payer: Self-pay | Admitting: Nurse Practitioner

## 2015-10-06 DIAGNOSIS — K7469 Other cirrhosis of liver: Secondary | ICD-10-CM

## 2015-10-12 ENCOUNTER — Ambulatory Visit
Admission: RE | Admit: 2015-10-12 | Discharge: 2015-10-12 | Disposition: A | Discharge status: Home / Self Care | Payer: BLUE CROSS/BLUE SHIELD | Source: Ambulatory Visit | Attending: Nurse Practitioner | Admitting: Nurse Practitioner

## 2015-10-12 DIAGNOSIS — K7460 Unspecified cirrhosis of liver: Secondary | ICD-10-CM | POA: Diagnosis not present

## 2015-10-12 DIAGNOSIS — K7469 Other cirrhosis of liver: Secondary | ICD-10-CM

## 2015-11-30 DIAGNOSIS — Z0000 Encounter for general adult medical examination without abnormal findings: Secondary | ICD-10-CM | POA: Diagnosis not present

## 2015-11-30 DIAGNOSIS — F17210 Nicotine dependence, cigarettes, uncomplicated: Secondary | ICD-10-CM | POA: Diagnosis not present

## 2015-11-30 DIAGNOSIS — M109 Gout, unspecified: Secondary | ICD-10-CM | POA: Diagnosis not present

## 2015-11-30 DIAGNOSIS — R946 Abnormal results of thyroid function studies: Secondary | ICD-10-CM | POA: Diagnosis not present

## 2015-11-30 DIAGNOSIS — I2510 Atherosclerotic heart disease of native coronary artery without angina pectoris: Secondary | ICD-10-CM | POA: Diagnosis not present

## 2015-11-30 DIAGNOSIS — B1920 Unspecified viral hepatitis C without hepatic coma: Secondary | ICD-10-CM | POA: Diagnosis not present

## 2015-11-30 DIAGNOSIS — E785 Hyperlipidemia, unspecified: Secondary | ICD-10-CM | POA: Diagnosis not present

## 2015-11-30 DIAGNOSIS — Z23 Encounter for immunization: Secondary | ICD-10-CM | POA: Diagnosis not present

## 2015-11-30 DIAGNOSIS — N183 Chronic kidney disease, stage 3 (moderate): Secondary | ICD-10-CM | POA: Diagnosis not present

## 2015-11-30 DIAGNOSIS — I129 Hypertensive chronic kidney disease with stage 1 through stage 4 chronic kidney disease, or unspecified chronic kidney disease: Secondary | ICD-10-CM | POA: Diagnosis not present

## 2015-11-30 DIAGNOSIS — Z125 Encounter for screening for malignant neoplasm of prostate: Secondary | ICD-10-CM | POA: Diagnosis not present

## 2016-03-30 DIAGNOSIS — E785 Hyperlipidemia, unspecified: Secondary | ICD-10-CM | POA: Diagnosis not present

## 2016-03-30 DIAGNOSIS — I429 Cardiomyopathy, unspecified: Secondary | ICD-10-CM | POA: Diagnosis not present

## 2016-03-30 DIAGNOSIS — I119 Hypertensive heart disease without heart failure: Secondary | ICD-10-CM | POA: Diagnosis not present

## 2016-03-30 DIAGNOSIS — I2510 Atherosclerotic heart disease of native coronary artery without angina pectoris: Secondary | ICD-10-CM | POA: Diagnosis not present

## 2016-03-30 DIAGNOSIS — N183 Chronic kidney disease, stage 3 (moderate): Secondary | ICD-10-CM | POA: Diagnosis not present

## 2016-04-02 DIAGNOSIS — K7469 Other cirrhosis of liver: Secondary | ICD-10-CM | POA: Diagnosis not present

## 2016-04-06 ENCOUNTER — Other Ambulatory Visit: Payer: Self-pay | Admitting: Nurse Practitioner

## 2016-04-06 DIAGNOSIS — K7460 Unspecified cirrhosis of liver: Secondary | ICD-10-CM

## 2016-04-12 ENCOUNTER — Ambulatory Visit
Admission: RE | Admit: 2016-04-12 | Discharge: 2016-04-12 | Disposition: A | Discharge status: Home / Self Care | Payer: 59 | Source: Ambulatory Visit | Attending: Nurse Practitioner | Admitting: Nurse Practitioner

## 2016-04-12 DIAGNOSIS — K7460 Unspecified cirrhosis of liver: Secondary | ICD-10-CM | POA: Diagnosis not present

## 2016-04-12 DIAGNOSIS — K8020 Calculus of gallbladder without cholecystitis without obstruction: Secondary | ICD-10-CM | POA: Diagnosis not present

## 2016-10-11 ENCOUNTER — Other Ambulatory Visit: Payer: Self-pay | Admitting: Nurse Practitioner

## 2016-10-11 DIAGNOSIS — K7469 Other cirrhosis of liver: Secondary | ICD-10-CM

## 2016-10-25 ENCOUNTER — Ambulatory Visit
Admission: RE | Admit: 2016-10-25 | Discharge: 2016-10-25 | Disposition: A | Discharge status: Home / Self Care | Payer: 59 | Source: Ambulatory Visit | Attending: Nurse Practitioner | Admitting: Nurse Practitioner

## 2016-10-25 DIAGNOSIS — K7460 Unspecified cirrhosis of liver: Secondary | ICD-10-CM | POA: Diagnosis not present

## 2016-10-25 DIAGNOSIS — K7469 Other cirrhosis of liver: Secondary | ICD-10-CM

## 2017-02-04 DIAGNOSIS — M109 Gout, unspecified: Secondary | ICD-10-CM | POA: Diagnosis not present

## 2017-02-04 DIAGNOSIS — E785 Hyperlipidemia, unspecified: Secondary | ICD-10-CM | POA: Diagnosis not present

## 2017-02-04 DIAGNOSIS — I129 Hypertensive chronic kidney disease with stage 1 through stage 4 chronic kidney disease, or unspecified chronic kidney disease: Secondary | ICD-10-CM | POA: Diagnosis not present

## 2017-02-04 DIAGNOSIS — Z23 Encounter for immunization: Secondary | ICD-10-CM | POA: Diagnosis not present

## 2017-02-04 DIAGNOSIS — Z1389 Encounter for screening for other disorder: Secondary | ICD-10-CM | POA: Diagnosis not present

## 2017-02-04 DIAGNOSIS — N183 Chronic kidney disease, stage 3 (moderate): Secondary | ICD-10-CM | POA: Diagnosis not present

## 2017-03-08 IMAGING — CR DG CHEST 2V
2 series · 2 of 2 positions shown · non-contrast
Comparison: None.

CLINICAL DATA: Chest pain and shortness of breath for 3 months

EXAM:
CHEST  2 VIEW

[w chest pa]
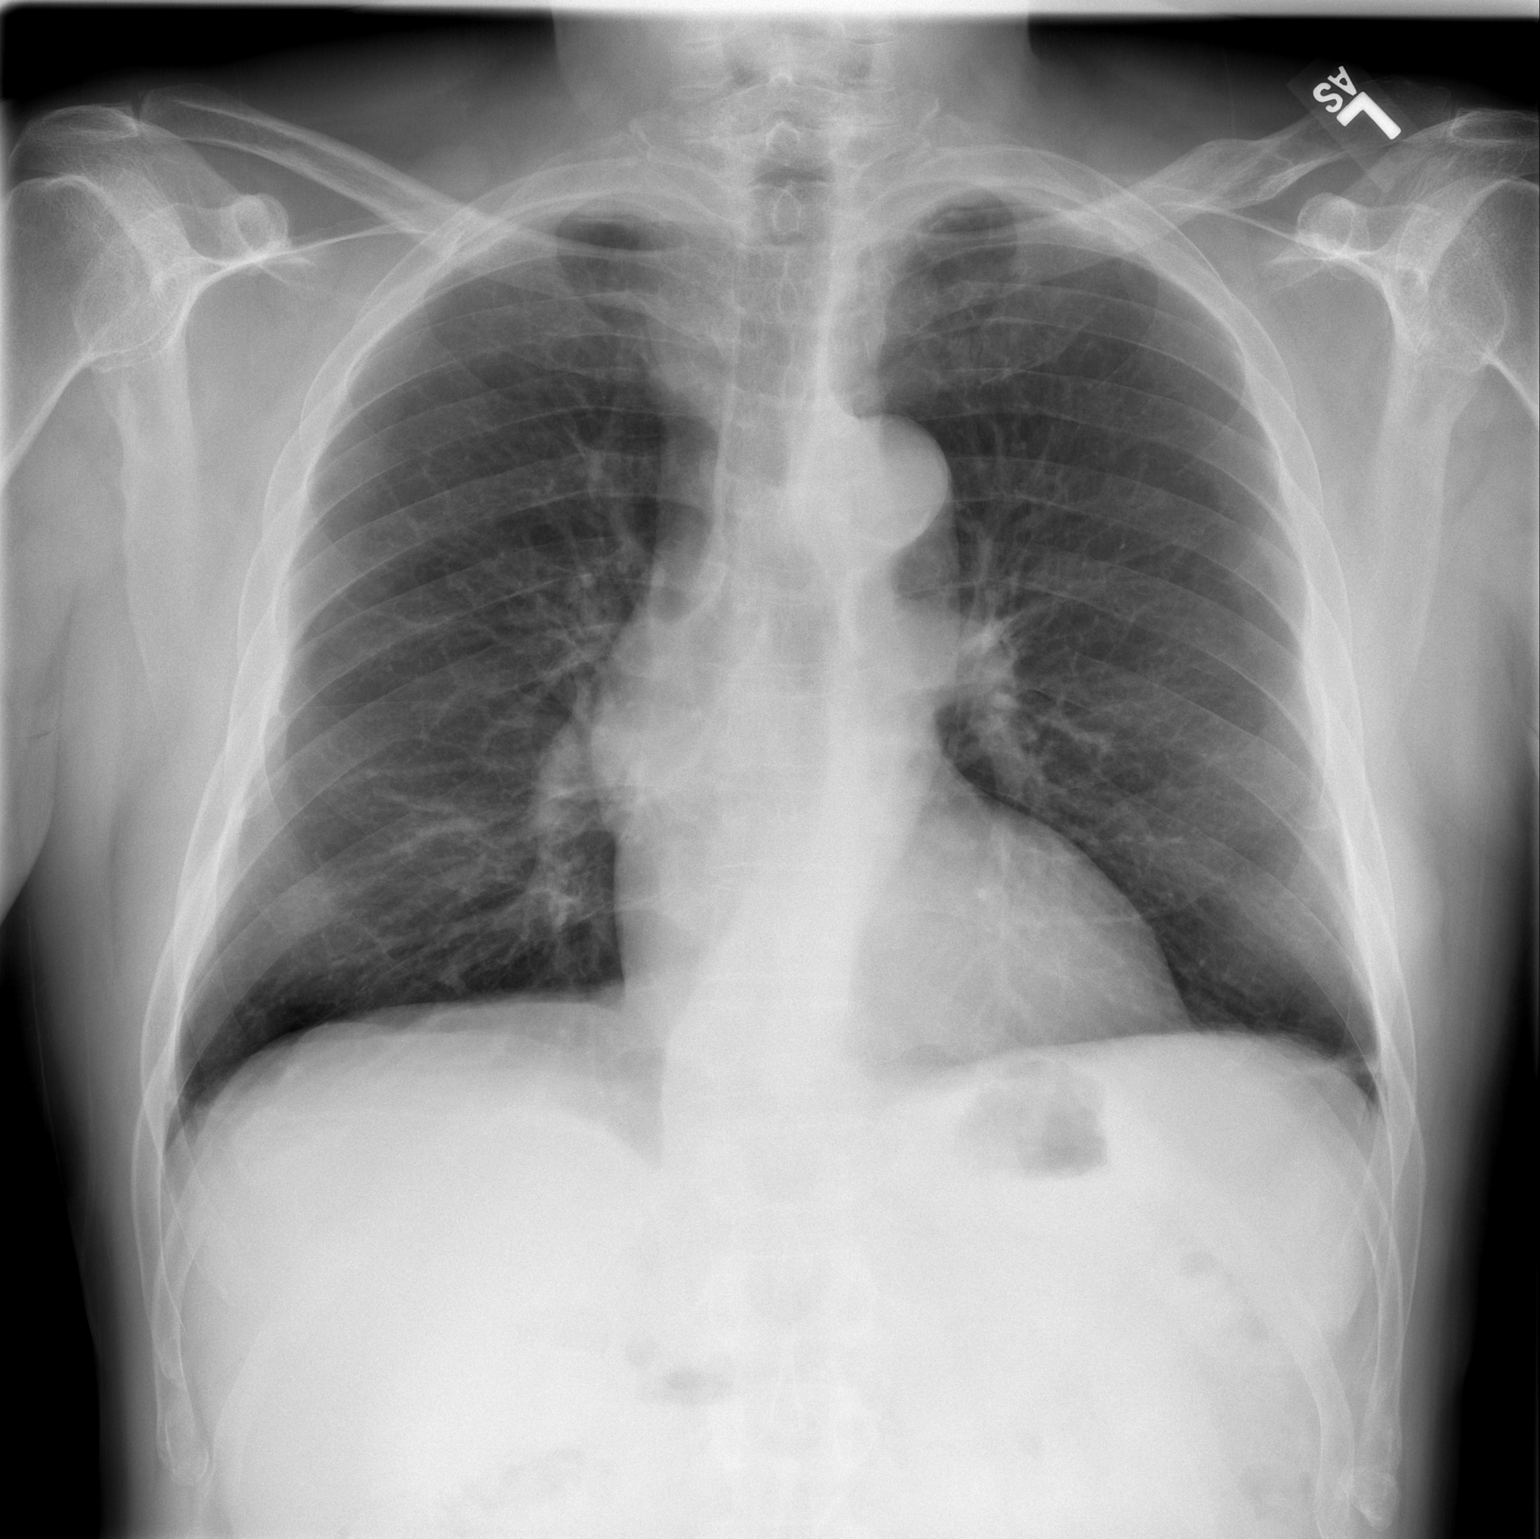

[w chest lat]
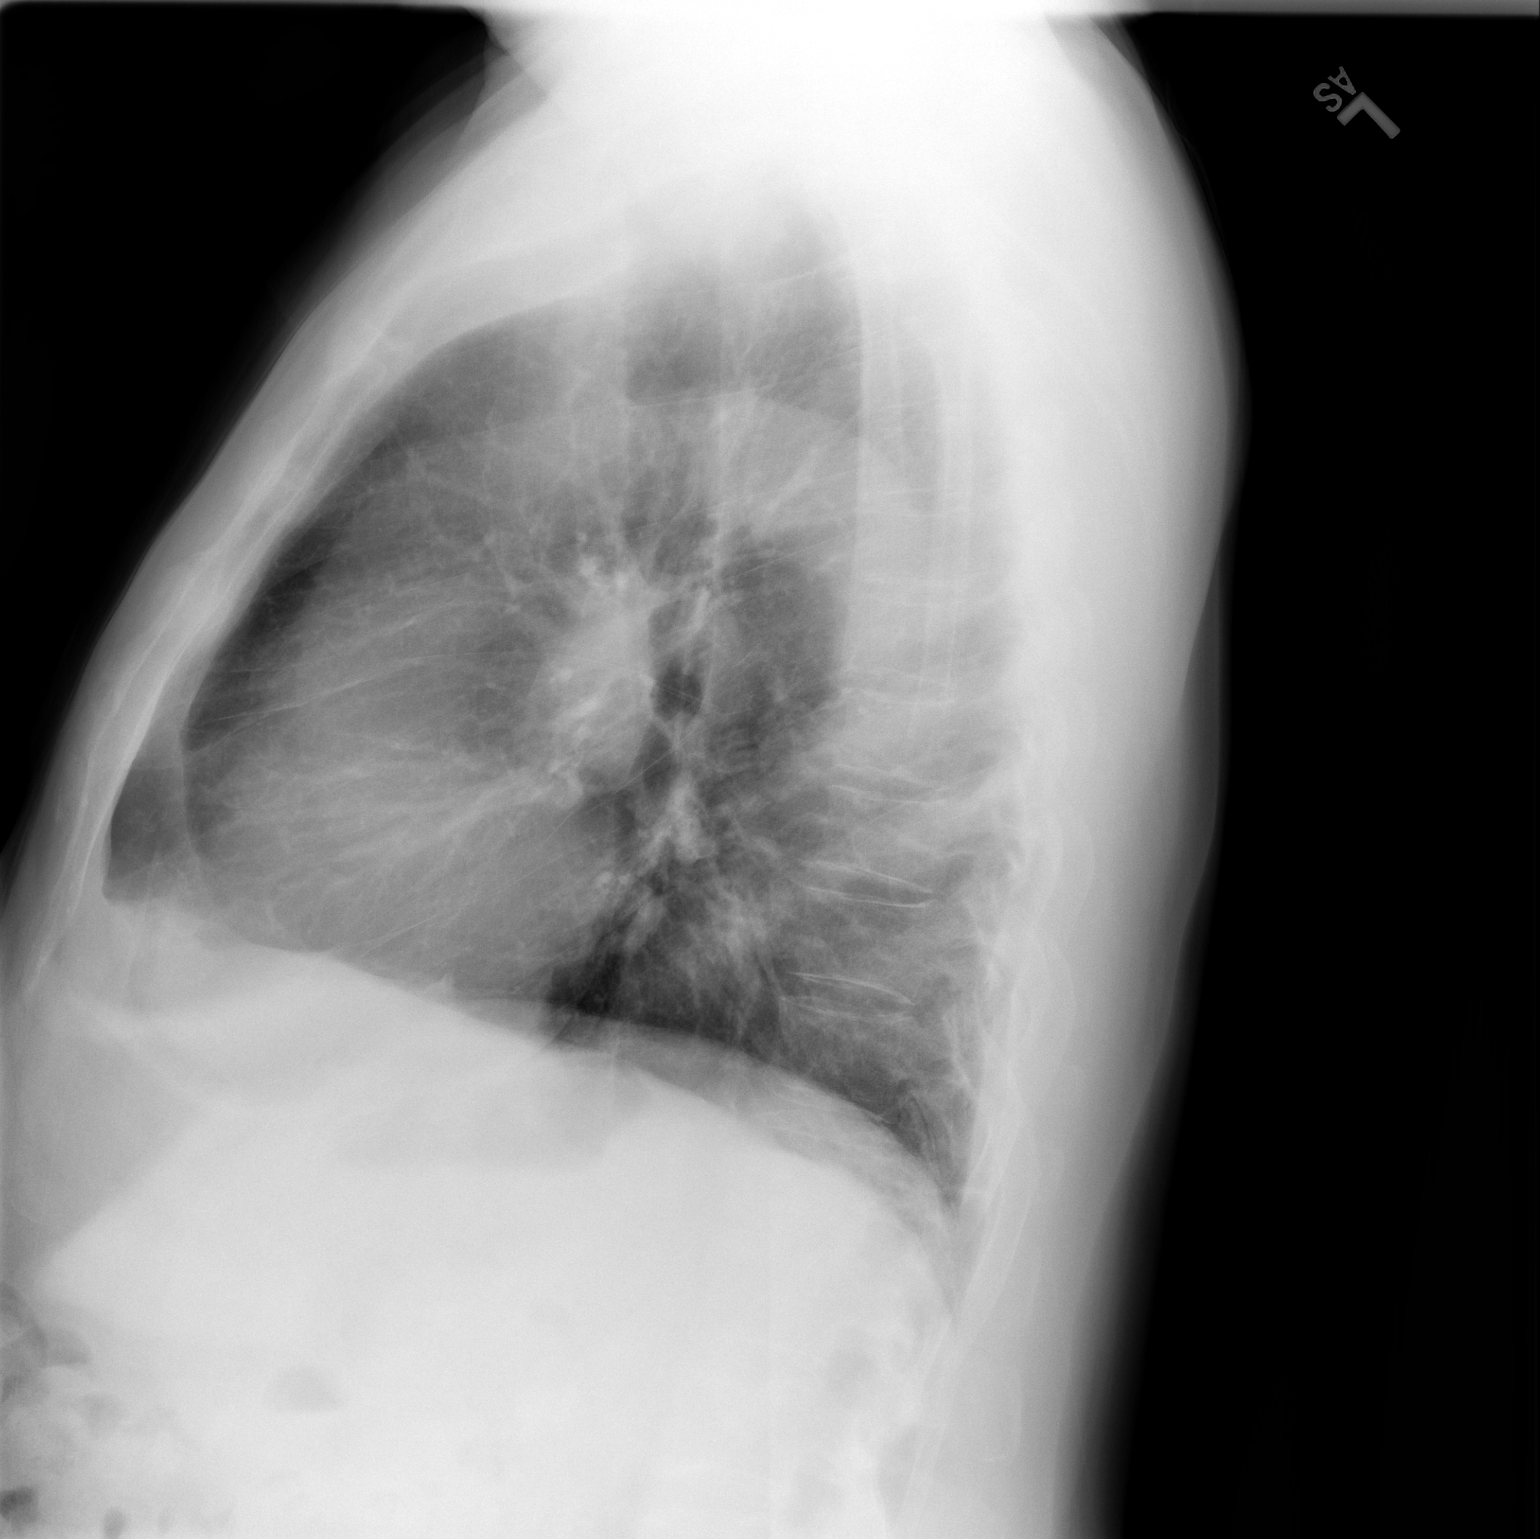

[2 of 2 positions shown; findings below may reference images not displayed]

FINDINGS: There is mild atelectasis in the lateral left base. Lungs elsewhere
are clear. Heart size and pulmonary vascularity are normal. No
adenopathy. There is atherosclerotic calcification in the aortic
arch. There is evidence of an old healed fracture of the left
lateral clavicle, with remodeling.
IMPRESSION: Mild left base atelectasis laterally. Lungs elsewhere clear. Aortic
atherosclerosis noted.

## 2017-03-14 DIAGNOSIS — I8500 Esophageal varices without bleeding: Secondary | ICD-10-CM | POA: Diagnosis not present

## 2017-03-14 DIAGNOSIS — K7469 Other cirrhosis of liver: Secondary | ICD-10-CM | POA: Diagnosis not present

## 2017-03-15 ENCOUNTER — Other Ambulatory Visit: Payer: Self-pay | Admitting: Nurse Practitioner

## 2017-03-15 DIAGNOSIS — K7469 Other cirrhosis of liver: Secondary | ICD-10-CM

## 2017-03-22 ENCOUNTER — Ambulatory Visit
Admission: RE | Admit: 2017-03-22 | Discharge: 2017-03-22 | Disposition: A | Discharge status: Home / Self Care | Payer: 59 | Source: Ambulatory Visit | Attending: Nurse Practitioner | Admitting: Nurse Practitioner

## 2017-03-22 DIAGNOSIS — K8020 Calculus of gallbladder without cholecystitis without obstruction: Secondary | ICD-10-CM | POA: Diagnosis not present

## 2017-03-22 DIAGNOSIS — K7469 Other cirrhosis of liver: Secondary | ICD-10-CM

## 2017-04-03 IMAGING — US US ABDOMEN LIMITED
1 series · 14 of 25 positions shown · non-contrast
Comparison: Ultrasound 04/08/2015

CLINICAL DATA: Cirrhosis

EXAM:
US ABDOMEN LIMITED - RIGHT UPPER QUADRANT

[Series 1: us abdomen limited · 0.30mm/px · 14 of 31 slices shown]
[im 1/31]
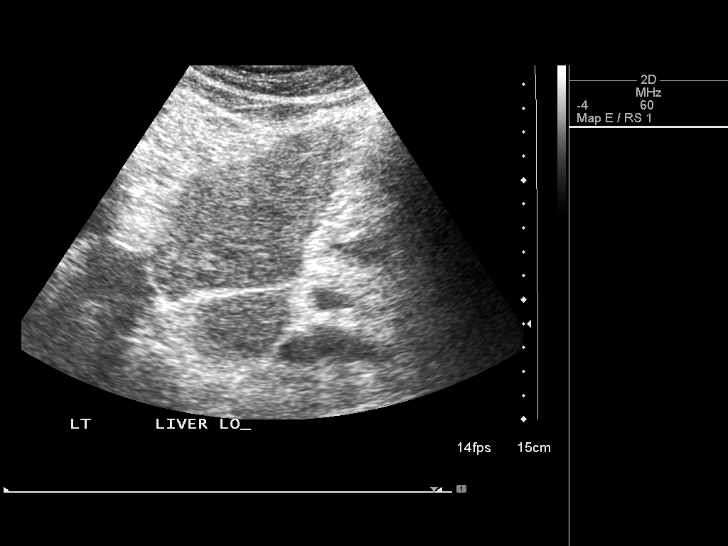
[im 3/31]
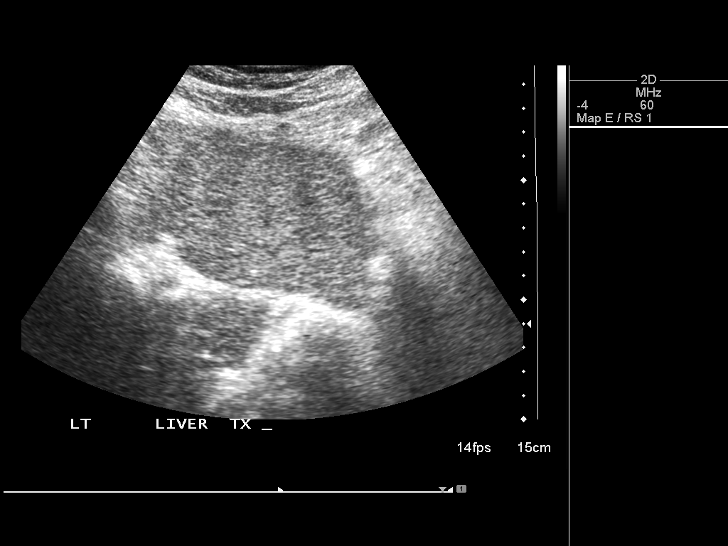
[im 6/31]
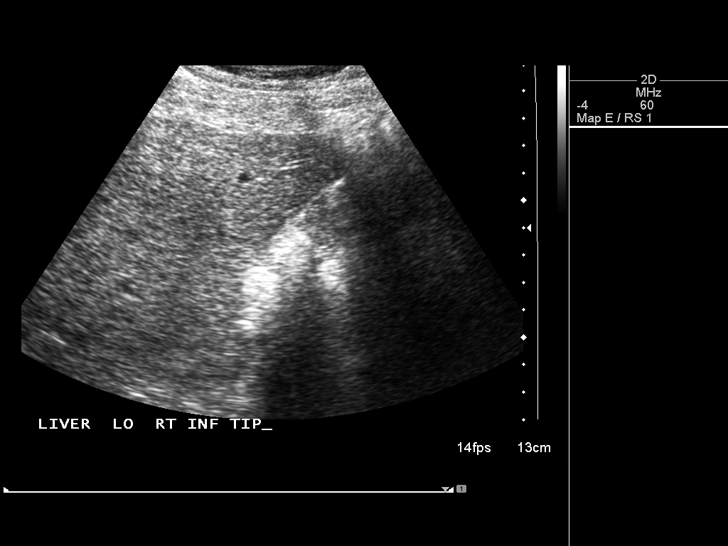
[im 8/31]
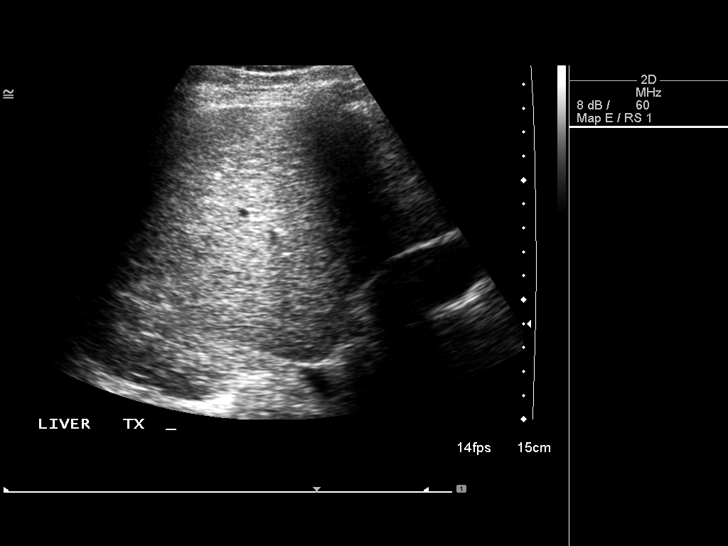
[im 11/31]
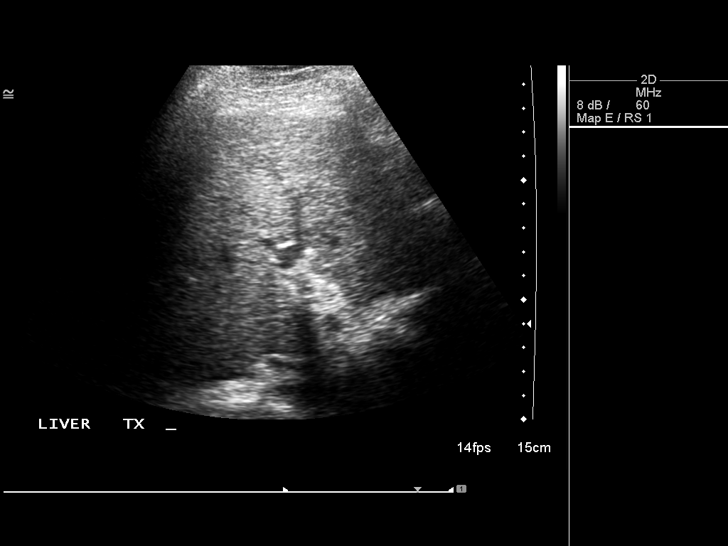
[im 12/31]
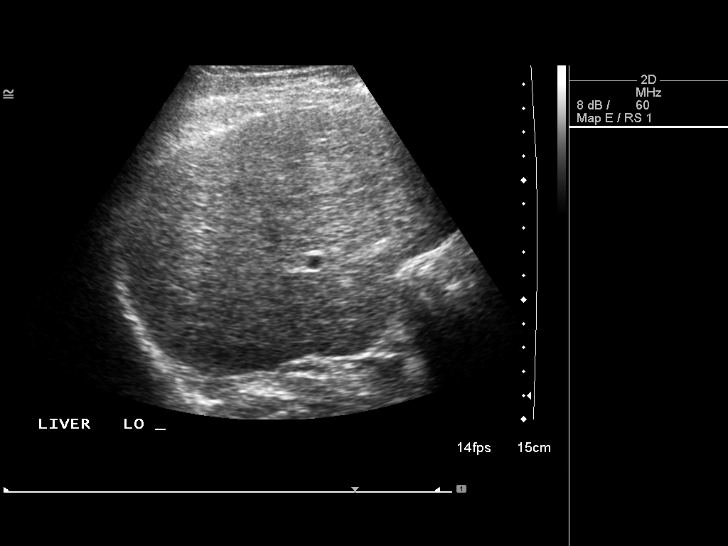
[im 14/31]
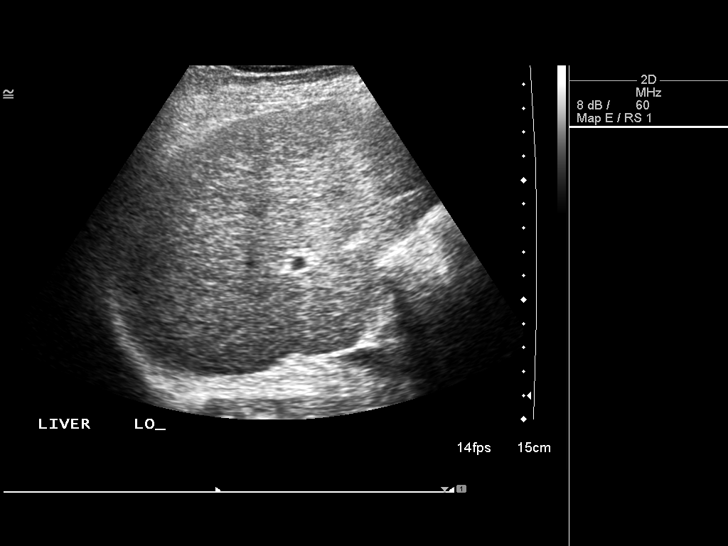
[im 17/31]
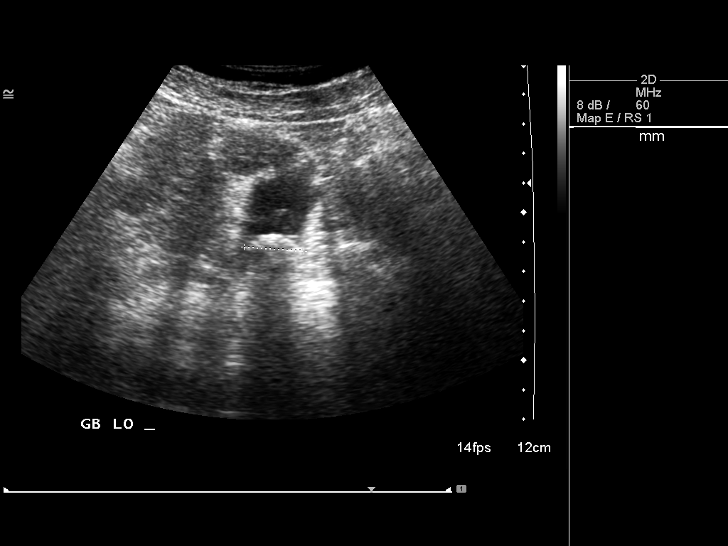
[im 19/31]
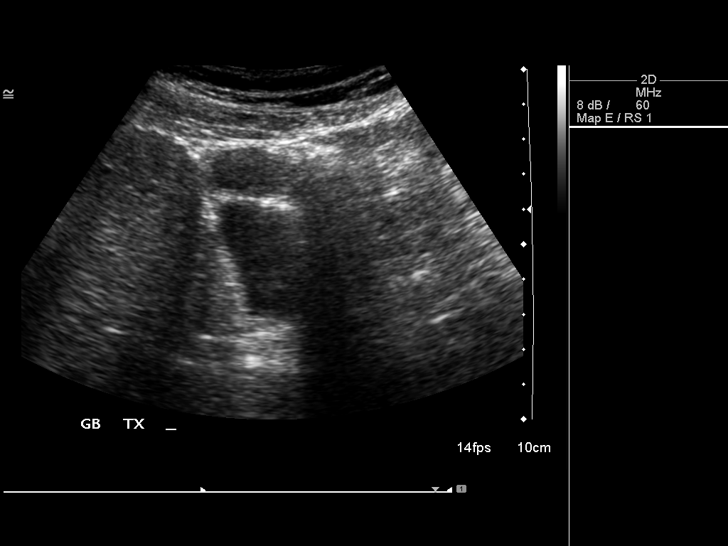
[im 21/31]
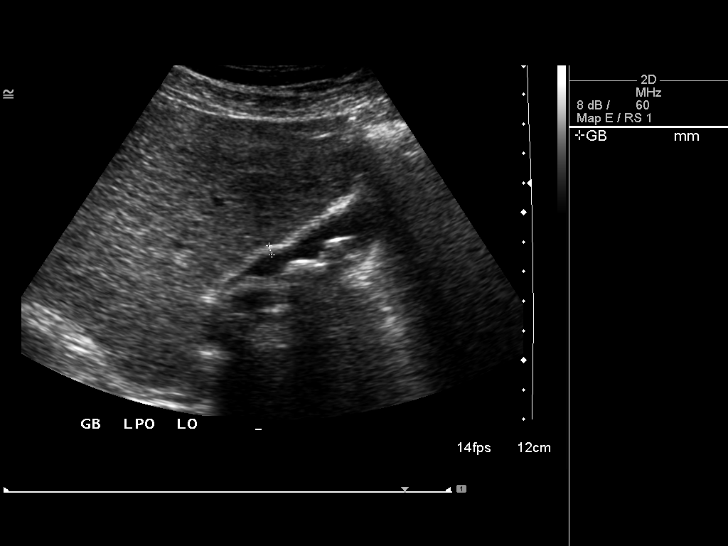
[im 23/31]
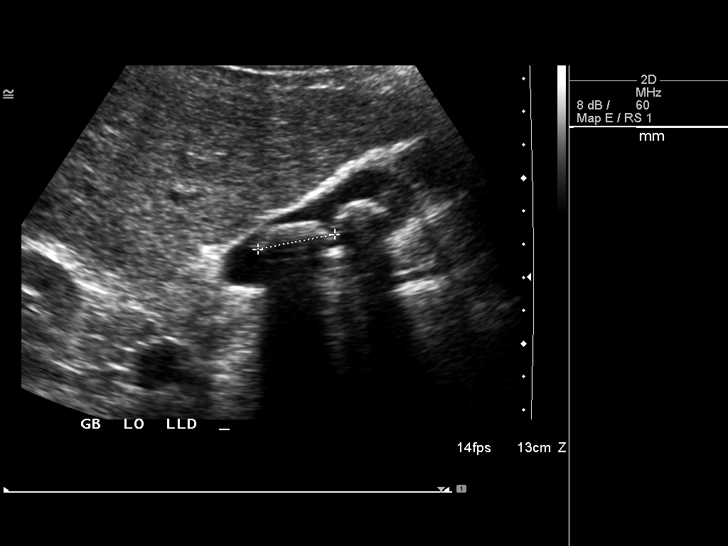
[im 26/31]
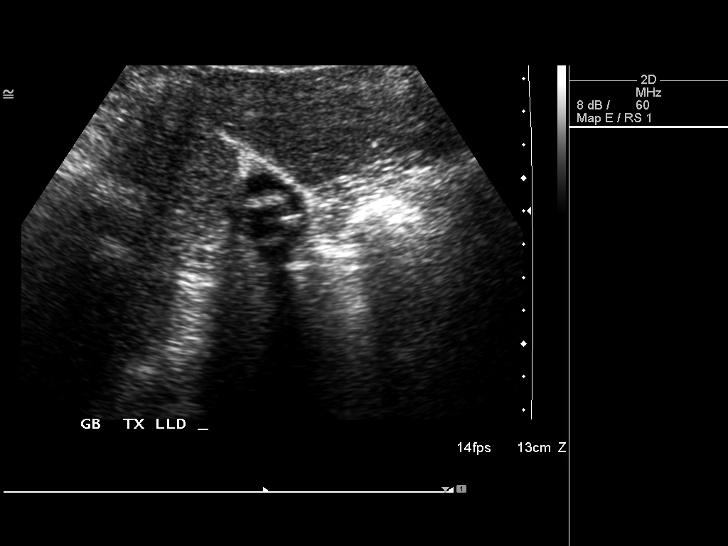
[im 28/31]
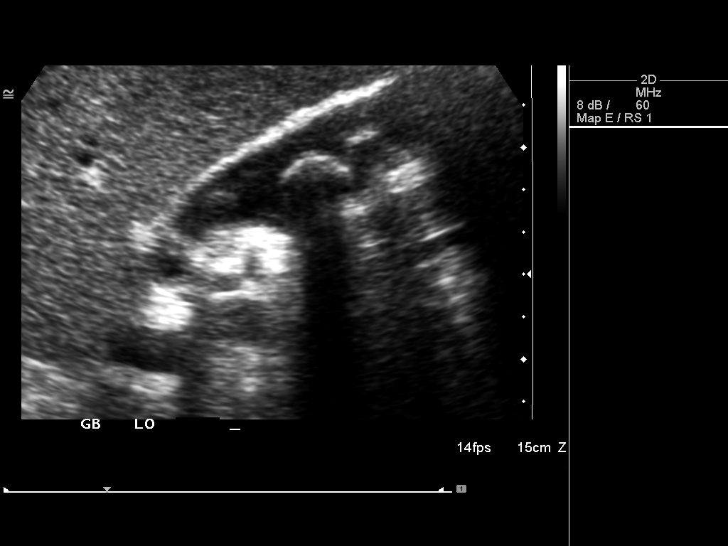
[im 31/31]
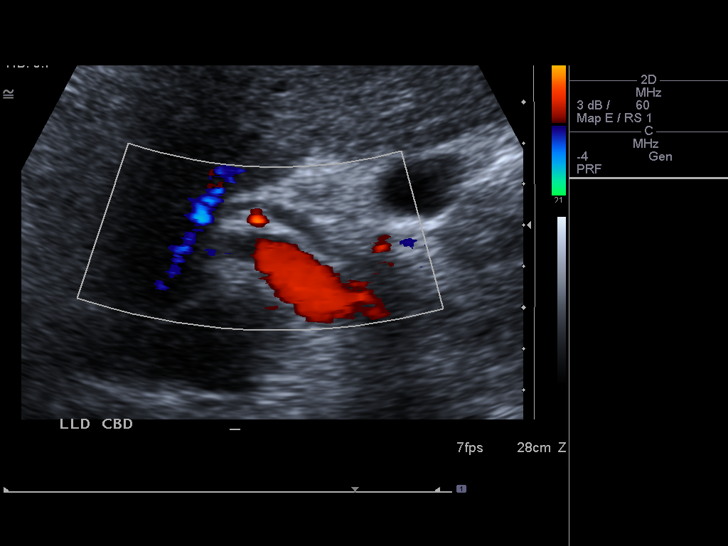

[14 of 25 positions shown; findings below may reference images not displayed]

FINDINGS: Gallbladder:

Multiple gallstones. Gallbladder wall 3.4 mm within normal limits.
Negative sonographic Murphy sign. Largest stone 24 mm

Common bile duct:

Diameter: 4.3 mm

Liver:

Diffusely coarse and increased echogenicity of the liver. No focal
liver mass. No ascites.
IMPRESSION: Increased echogenicity liver without focal liver mass. No change
from the prior ultrasound.

Cholelithiasis.

## 2017-08-06 DIAGNOSIS — Z0000 Encounter for general adult medical examination without abnormal findings: Secondary | ICD-10-CM | POA: Diagnosis not present

## 2017-08-06 DIAGNOSIS — I129 Hypertensive chronic kidney disease with stage 1 through stage 4 chronic kidney disease, or unspecified chronic kidney disease: Secondary | ICD-10-CM | POA: Diagnosis not present

## 2017-08-06 DIAGNOSIS — F17210 Nicotine dependence, cigarettes, uncomplicated: Secondary | ICD-10-CM | POA: Diagnosis not present

## 2017-08-06 DIAGNOSIS — N183 Chronic kidney disease, stage 3 (moderate): Secondary | ICD-10-CM | POA: Diagnosis not present

## 2017-08-06 DIAGNOSIS — Z1389 Encounter for screening for other disorder: Secondary | ICD-10-CM | POA: Diagnosis not present

## 2017-08-06 DIAGNOSIS — I2510 Atherosclerotic heart disease of native coronary artery without angina pectoris: Secondary | ICD-10-CM | POA: Diagnosis not present

## 2017-08-06 DIAGNOSIS — Z125 Encounter for screening for malignant neoplasm of prostate: Secondary | ICD-10-CM | POA: Diagnosis not present

## 2017-08-06 DIAGNOSIS — M109 Gout, unspecified: Secondary | ICD-10-CM | POA: Diagnosis not present

## 2017-09-19 DIAGNOSIS — I8500 Esophageal varices without bleeding: Secondary | ICD-10-CM | POA: Diagnosis not present

## 2017-09-19 DIAGNOSIS — K7469 Other cirrhosis of liver: Secondary | ICD-10-CM | POA: Diagnosis not present

## 2017-09-21 ENCOUNTER — Other Ambulatory Visit: Payer: Self-pay | Admitting: Nurse Practitioner

## 2017-09-21 DIAGNOSIS — K7469 Other cirrhosis of liver: Secondary | ICD-10-CM

## 2018-03-24 ENCOUNTER — Encounter (HOSPITAL_COMMUNITY): Payer: Self-pay

## 2018-03-24 ENCOUNTER — Ambulatory Visit (HOSPITAL_COMMUNITY)
Admission: EM | Admit: 2018-03-24 | Discharge: 2018-03-24 | Disposition: A | Discharge status: Home / Self Care | Payer: 59 | Attending: Family Medicine | Admitting: Family Medicine

## 2018-03-24 ENCOUNTER — Other Ambulatory Visit: Payer: Self-pay

## 2018-03-24 DIAGNOSIS — R059 Cough, unspecified: Secondary | ICD-10-CM

## 2018-03-24 DIAGNOSIS — R05 Cough: Secondary | ICD-10-CM

## 2018-03-24 DIAGNOSIS — J209 Acute bronchitis, unspecified: Secondary | ICD-10-CM

## 2018-03-24 MED ORDER — ALBUTEROL SULFATE HFA 108 (90 BASE) MCG/ACT IN AERS: 1.0000 | INHALATION_SPRAY | Freq: Four times a day (QID) | RESPIRATORY_TRACT | 0 refills | Status: AC | PRN

## 2018-03-24 MED ORDER — BENZONATATE 200 MG PO CAPS: 200.0000 mg | ORAL_CAPSULE | Freq: Three times a day (TID) | ORAL | 0 refills | Status: AC | PRN

## 2018-03-24 MED ORDER — PREDNISONE 10 MG PO TABS: 30.0000 mg | ORAL_TABLET | Freq: Every day | ORAL | 0 refills | Status: AC

## 2018-03-24 NOTE — ED Provider Notes (Signed)
[place]    CSN: [id number] Arrival date & time: 03/24/18  [id number]     History   Chief Complaint Chief Complaint  Patient presents with  . Cough    HPI Keith Nguyen. is a 68 y.o. male history of hypertension, hyperlipidemia, presenting today for evaluation of a cough.  Patient that he has had a cough for approximately 2 days.  He has had some mild shortness of breath and lightheadedness with coughing.  He has tried some over-the-counter medicines with minimal relief.  Had sore throat initially, but this resolved after 1 day.  He has had minimal rhinorrhea.  Denies fevers.  Most of the symptoms feel more in his chest.  Notes that he smokes approximately 3 to 4 cigarettes a day.  Denies history of asthma.  HPI  Past Medical History:  Diagnosis Date  . Abnormal cardiac function test 09/16/2015  . Aortic atherosclerosis ([place]) 09/16/2015  . History of hepatitis C 09/16/2015   Prior treatment with Harvoni   . Hyperlipidemia 09/16/2015  . Hypertensive heart disease without CHF 09/16/2015    Patient Active Problem List   Diagnosis Date Noted  . Abnormal nuclear stress test   . Cardiomyopathy ([place])   . Abnormal cardiac function test 09/16/2015  . Hypertensive heart disease without CHF 09/16/2015  . Hyperlipidemia 09/16/2015  . Aortic atherosclerosis ([place]) 09/16/2015  . History of hepatitis C 09/16/2015    Past Surgical History:  Procedure Laterality Date  . CARDIAC CATHETERIZATION N/A 09/22/2015   Procedure: Left Heart Cath and Coronary Angiography;  Surgeon: Troy Sine, MD;  Location: [facility];  Service: Cardiovascular;  Laterality: N/A;  . TONSILLECTOMY         Home Medications    Prior to Admission medications   Medication Sig Start Date End Date Taking? Authorizing Provider  albuterol (PROVENTIL HFA;VENTOLIN HFA) 108 (90 Base) MCG/ACT inhaler Inhale 1-2 puffs into the lungs every 6 (six) hours as needed for wheezing or shortness of breath.  03/24/18   Delene Morais C, PA-C  allopurinol (ZYLOPRIM) 100 MG tablet Take 100 mg by mouth daily.    [provider]  aspirin 81 MG tablet Take 81 mg by mouth daily.    [provider]  benzonatate (TESSALON) 200 MG capsule Take 1 capsule (200 mg total) by mouth 3 (three) times daily as needed for up to 7 days for cough. 03/24/18 03/31/18  Dailah Opperman C, PA-C  furosemide (LASIX) 40 MG tablet Take 40 mg by mouth daily.    [provider]  losartan (COZAAR) 100 MG tablet Take 100 mg by mouth daily.    [provider]  metoprolol succinate (TOPROL-XL) 100 MG 24 hr tablet Take 200 mg by mouth daily. Take with or immediately following a meal.    [provider]  Omega-3 Fatty Acids (FISH OIL) 1000 MG CAPS Take 4 capsules by mouth daily.    [provider]  predniSONE (DELTASONE) 10 MG tablet Take 3 tablets (30 mg total) by mouth daily for 5 days. 03/24/18 03/29/18  Mccormick Macon, Elesa Hacker, PA-C    Family History Family History  Problem Relation Age of Onset  . Stroke Mother   . Lung cancer Father   . Hypertension Sister     Social History Social History   Tobacco Use  . Smoking status: Current Every Day Smoker  . Smokeless tobacco: Never Used  Substance Use Topics  . Alcohol use: Yes    Alcohol/week: 4.0 -  6.0 standard drinks    Types: 4 - 6 Shots of liquor per week    Comment: Pt drinks beer and liquor daily  . Drug use: No     Allergies   Lipitor [atorvastatin]   Review of Systems Review of Systems  Constitutional: Negative for activity change, appetite change, chills, fatigue and fever.  HENT: Positive for congestion. Negative for ear pain, rhinorrhea, sinus pressure, sore throat and trouble swallowing.   Eyes: Negative for discharge and redness.  Respiratory: Positive for cough and shortness of breath. Negative for chest tightness.   Cardiovascular: Negative for chest pain.  Gastrointestinal: Negative for abdominal pain,  diarrhea, nausea and vomiting.  Musculoskeletal: Negative for myalgias.  Skin: Negative for rash.  Neurological: Positive for light-headedness. Negative for dizziness and headaches.     Physical Exam Triage Vital Signs ED Triage Vitals  Enc Vitals Group     BP 03/24/18 0955 (!) 159/89     Pulse Rate 03/24/18 0955 86     Resp 03/24/18 0955 18     Temp 03/24/18 0955 98.8 F (37.1 C)     Temp Source 03/24/18 0955 Oral     SpO2 03/24/18 0955 97 %     Weight 03/24/18 1003 175 lb (79.4 kg)     Height --      Head Circumference --      Peak Flow --      Pain Score 03/24/18 1003 5     Pain Loc --      Pain Edu? --      Excl. in [place]? --    No data found.  Updated Vital Signs BP (!) 159/89 (BP Location: Left Arm)   Pulse 86   Temp 98.8 F (37.1 C) (Oral)   Resp 18   Wt 175 lb (79.4 kg)   SpO2 97%   BMI 24.41 kg/m   Visual Acuity Right Eye Distance:   Left Eye Distance:   Bilateral Distance:    Right Eye Near:   Left Eye Near:    Bilateral Near:     Physical Exam Vitals signs and nursing note reviewed.  Constitutional:      Appearance: He is well-developed.  HENT:     Head: Normocephalic and atraumatic.     Ears:     Comments: Bilateral ears without tenderness to palpation of external auricle, tragus and mastoid, EAC's without erythema or swelling, TM's with good bony landmarks and cone of light. Non erythematous.    Mouth/Throat:     Comments: Oral mucosa pink and moist, no tonsillar enlargement or exudate. Posterior pharynx patent and nonerythematous, no uvula deviation or swelling. Normal phonation. Eyes:     Conjunctiva/sclera: Conjunctivae normal.  Neck:     Musculoskeletal: Neck supple.  Cardiovascular:     Rate and Rhythm: Normal rate and regular rhythm.     Heart sounds: No murmur.  Pulmonary:     Effort: Pulmonary effort is normal. No respiratory distress.     Breath sounds: Normal breath sounds.     Comments: Breathing comfortably at rest, faint end  inspiratory wheezing throughout bilateral lung fields, no rales Abdominal:     Palpations: Abdomen is soft.     Tenderness: There is no abdominal tenderness.  Skin:    General: Skin is warm and dry.  Neurological:     Mental Status: He is alert.      UC Treatments / Results  Labs (all labs ordered are listed, but only abnormal results are  displayed) Labs Reviewed - No data to display  EKG None  Radiology No results found.  Procedures Procedures (including critical care time)  Medications Ordered in UC Medications - No data to display  Initial Impression / Assessment and Plan / UC Course  I have reviewed the triage vital signs and the nursing notes.  Pertinent labs & imaging results that were available during my care of the patient were reviewed by me and considered in my medical decision making (see chart for details).     Vital signs stable, exam nonfocal, slight wheezing bilateral lung fields, will good initiate treatment for bronchitis with albuterol and prednisone.  Tessalon for cough.  Continue to monitor,Discussed strict return precautions. Patient verbalized understanding and is agreeable with plan.  Final Clinical Impressions(s) / UC Diagnoses   Final diagnoses:  Cough  Acute bronchitis, unspecified organism     Discharge Instructions     Please begin prednisone 30 mg daily for 5 days, take with food and in the morning if able Albuterol inhaler 1-2 puffs as needed for shortness of breathing, chest tightness, wheezing every 4-6 hours Tessalon as needed for cough every 8 hours Use mucinex as needed for congestion  Follow up if developing fevers, shortness of breath, difficulty breathing, worsening symptoms, persistent symptoms   ED Prescriptions    Medication Sig Dispense Auth. Provider   albuterol (PROVENTIL HFA;VENTOLIN HFA) 108 (90 Base) MCG/ACT inhaler Inhale 1-2 puffs into the lungs every 6 (six) hours as needed for wheezing or shortness of breath.  1 Inhaler Abbie Berling C, PA-C   benzonatate (TESSALON) 200 MG capsule Take 1 capsule (200 mg total) by mouth 3 (three) times daily as needed for up to 7 days for cough. 28 capsule Briston Lax C, PA-C   predniSONE (DELTASONE) 10 MG tablet Take 3 tablets (30 mg total) by mouth daily for 5 days. 15 tablet Shamiah Kahler, Bernard C, PA-C     Controlled Substance Prescriptions [place] Controlled Substance Registry consulted? Not Applicable   Janith Lima, [place] 03/24/18 1039

## 2018-03-24 NOTE — Discharge Instructions (Addendum)
Please begin prednisone 30 mg daily for 5 days, take with food and in the morning if able Albuterol inhaler 1-2 puffs as needed for shortness of breathing, chest tightness, wheezing every 4-6 hours Tessalon as needed for cough every 8 hours Use mucinex as needed for congestion  Follow up if developing fevers, shortness of breath, difficulty breathing, worsening symptoms, persistent symptoms

## 2018-03-24 NOTE — ED Triage Notes (Signed)
Pt cc he has a deep tight cough x 2 days.

## 2018-11-03 ENCOUNTER — Encounter (HOSPITAL_COMMUNITY): Payer: Self-pay | Admitting: Emergency Medicine

## 2018-11-03 ENCOUNTER — Emergency Department (HOSPITAL_COMMUNITY): Payer: 59

## 2018-11-03 ENCOUNTER — Other Ambulatory Visit: Payer: Self-pay

## 2018-11-03 ENCOUNTER — Inpatient Hospital Stay (HOSPITAL_COMMUNITY)
Admission: EM | Admit: 2018-11-03 | Discharge: 2018-12-23 | DRG: 001 | Disposition: E | Payer: 59 | Attending: Cardiothoracic Surgery | Admitting: Cardiothoracic Surgery

## 2018-11-03 DIAGNOSIS — N183 Chronic kidney disease, stage 3 unspecified: Secondary | ICD-10-CM | POA: Diagnosis present

## 2018-11-03 DIAGNOSIS — I2510 Atherosclerotic heart disease of native coronary artery without angina pectoris: Secondary | ICD-10-CM | POA: Diagnosis present

## 2018-11-03 DIAGNOSIS — I700 Atherosclerosis of aorta: Secondary | ICD-10-CM | POA: Diagnosis present

## 2018-11-03 DIAGNOSIS — G4730 Sleep apnea, unspecified: Secondary | ICD-10-CM | POA: Diagnosis present

## 2018-11-03 DIAGNOSIS — Z888 Allergy status to other drugs, medicaments and biological substances status: Secondary | ICD-10-CM

## 2018-11-03 DIAGNOSIS — Z515 Encounter for palliative care: Secondary | ICD-10-CM

## 2018-11-03 DIAGNOSIS — Z681 Body mass index (BMI) 19 or less, adult: Secondary | ICD-10-CM

## 2018-11-03 DIAGNOSIS — S22000A Wedge compression fracture of unspecified thoracic vertebra, initial encounter for closed fracture: Secondary | ICD-10-CM

## 2018-11-03 DIAGNOSIS — Z66 Do not resuscitate: Secondary | ICD-10-CM

## 2018-11-03 DIAGNOSIS — Z20828 Contact with and (suspected) exposure to other viral communicable diseases: Secondary | ICD-10-CM | POA: Diagnosis present

## 2018-11-03 DIAGNOSIS — I5043 Acute on chronic combined systolic (congestive) and diastolic (congestive) heart failure: Secondary | ICD-10-CM | POA: Diagnosis present

## 2018-11-03 DIAGNOSIS — I712 Thoracic aortic aneurysm, without rupture, unspecified: Secondary | ICD-10-CM

## 2018-11-03 DIAGNOSIS — Z95811 Presence of heart assist device: Secondary | ICD-10-CM

## 2018-11-03 DIAGNOSIS — Z8619 Personal history of other infectious and parasitic diseases: Secondary | ICD-10-CM | POA: Diagnosis present

## 2018-11-03 DIAGNOSIS — Z87891 Personal history of nicotine dependence: Secondary | ICD-10-CM

## 2018-11-03 DIAGNOSIS — Z01810 Encounter for preprocedural cardiovascular examination: Secondary | ICD-10-CM

## 2018-11-03 DIAGNOSIS — E876 Hypokalemia: Secondary | ICD-10-CM | POA: Diagnosis not present

## 2018-11-03 DIAGNOSIS — I10 Essential (primary) hypertension: Secondary | ICD-10-CM | POA: Diagnosis present

## 2018-11-03 DIAGNOSIS — I440 Atrioventricular block, first degree: Secondary | ICD-10-CM | POA: Diagnosis present

## 2018-11-03 DIAGNOSIS — I5084 End stage heart failure: Secondary | ICD-10-CM | POA: Diagnosis present

## 2018-11-03 DIAGNOSIS — E43 Unspecified severe protein-calorie malnutrition: Secondary | ICD-10-CM | POA: Insufficient documentation

## 2018-11-03 DIAGNOSIS — I313 Pericardial effusion (noninflammatory): Secondary | ICD-10-CM | POA: Diagnosis present

## 2018-11-03 DIAGNOSIS — N179 Acute kidney failure, unspecified: Secondary | ICD-10-CM | POA: Diagnosis not present

## 2018-11-03 DIAGNOSIS — I130 Hypertensive heart and chronic kidney disease with heart failure and stage 1 through stage 4 chronic kidney disease, or unspecified chronic kidney disease: Secondary | ICD-10-CM | POA: Diagnosis not present

## 2018-11-03 DIAGNOSIS — I351 Nonrheumatic aortic (valve) insufficiency: Secondary | ICD-10-CM | POA: Diagnosis present

## 2018-11-03 DIAGNOSIS — K5900 Constipation, unspecified: Secondary | ICD-10-CM | POA: Diagnosis not present

## 2018-11-03 DIAGNOSIS — I469 Cardiac arrest, cause unspecified: Secondary | ICD-10-CM | POA: Diagnosis not present

## 2018-11-03 DIAGNOSIS — I5023 Acute on chronic systolic (congestive) heart failure: Secondary | ICD-10-CM | POA: Diagnosis present

## 2018-11-03 DIAGNOSIS — T508X5A Adverse effect of diagnostic agents, initial encounter: Secondary | ICD-10-CM | POA: Diagnosis not present

## 2018-11-03 DIAGNOSIS — Z7982 Long term (current) use of aspirin: Secondary | ICD-10-CM

## 2018-11-03 DIAGNOSIS — Z01818 Encounter for other preprocedural examination: Secondary | ICD-10-CM

## 2018-11-03 DIAGNOSIS — Z801 Family history of malignant neoplasm of trachea, bronchus and lung: Secondary | ICD-10-CM

## 2018-11-03 DIAGNOSIS — N189 Chronic kidney disease, unspecified: Secondary | ICD-10-CM

## 2018-11-03 DIAGNOSIS — J9601 Acute respiratory failure with hypoxia: Secondary | ICD-10-CM | POA: Diagnosis not present

## 2018-11-03 DIAGNOSIS — R5383 Other fatigue: Secondary | ICD-10-CM

## 2018-11-03 DIAGNOSIS — I509 Heart failure, unspecified: Principal | ICD-10-CM

## 2018-11-03 DIAGNOSIS — E871 Hypo-osmolality and hyponatremia: Secondary | ICD-10-CM | POA: Diagnosis not present

## 2018-11-03 DIAGNOSIS — Z9889 Other specified postprocedural states: Secondary | ICD-10-CM

## 2018-11-03 DIAGNOSIS — M4854XA Collapsed vertebra, not elsewhere classified, thoracic region, initial encounter for fracture: Secondary | ICD-10-CM | POA: Diagnosis present

## 2018-11-03 DIAGNOSIS — E875 Hyperkalemia: Secondary | ICD-10-CM | POA: Diagnosis not present

## 2018-11-03 DIAGNOSIS — I314 Cardiac tamponade: Secondary | ICD-10-CM

## 2018-11-03 DIAGNOSIS — Z4659 Encounter for fitting and adjustment of other gastrointestinal appliance and device: Secondary | ICD-10-CM

## 2018-11-03 DIAGNOSIS — I428 Other cardiomyopathies: Secondary | ICD-10-CM | POA: Diagnosis present

## 2018-11-03 DIAGNOSIS — J439 Emphysema, unspecified: Secondary | ICD-10-CM | POA: Diagnosis present

## 2018-11-03 DIAGNOSIS — I5082 Biventricular heart failure: Secondary | ICD-10-CM | POA: Diagnosis present

## 2018-11-03 DIAGNOSIS — K7460 Unspecified cirrhosis of liver: Secondary | ICD-10-CM | POA: Diagnosis present

## 2018-11-03 DIAGNOSIS — Z823 Family history of stroke: Secondary | ICD-10-CM

## 2018-11-03 DIAGNOSIS — I4892 Unspecified atrial flutter: Secondary | ICD-10-CM | POA: Diagnosis not present

## 2018-11-03 DIAGNOSIS — T502X5A Adverse effect of carbonic-anhydrase inhibitors, benzothiadiazides and other diuretics, initial encounter: Secondary | ICD-10-CM | POA: Diagnosis not present

## 2018-11-03 DIAGNOSIS — Z8249 Family history of ischemic heart disease and other diseases of the circulatory system: Secondary | ICD-10-CM

## 2018-11-03 DIAGNOSIS — I4891 Unspecified atrial fibrillation: Secondary | ICD-10-CM | POA: Diagnosis not present

## 2018-11-03 DIAGNOSIS — E785 Hyperlipidemia, unspecified: Secondary | ICD-10-CM | POA: Diagnosis present

## 2018-11-03 LAB — BASIC METABOLIC PANEL
Anion gap: 12 (ref 5–15)
BUN: 18 mg/dL (ref 8–23)
CO2: 21 mmol/L — ABNORMAL LOW (ref 22–32)
Calcium: 9.5 mg/dL (ref 8.9–10.3)
Chloride: 103 mmol/L (ref 98–111)
Creatinine, Ser: 1.29 mg/dL — ABNORMAL HIGH (ref 0.61–1.24)
GFR calc Af Amer: >60 mL/min (ref >60)
GFR calc non Af Amer: 56 mL/min — ABNORMAL LOW (ref >60)
Glucose, Bld: 99 mg/dL (ref 70–99)
Potassium: 4.8 mmol/L (ref 3.5–5.1)
Sodium: 136 mmol/L (ref 135–145)

## 2018-11-03 LAB — D-DIMER, QUANTITATIVE: D-Dimer, Quant: 3.54 ug/mL-FEU — ABNORMAL HIGH (ref 0.00–0.50)

## 2018-11-03 LAB — CBC
HCT: 49.2 % (ref 39.0–52.0)
Hemoglobin: 15.2 g/dL (ref 13.0–17.0)
MCH: 29.6 pg (ref 26.0–34.0)
MCHC: 30.9 g/dL (ref 30.0–36.0)
MCV: 95.7 fL (ref 80.0–100.0)
Platelets: 180 10*3/uL (ref 150–400)
RBC: 5.14 MIL/uL (ref 4.22–5.81)
RDW: 16.9 % — ABNORMAL HIGH (ref 11.5–15.5)
WBC: 4.2 10*3/uL (ref 4.0–10.5)
nRBC: 0 % (ref 0.0–0.2)

## 2018-11-03 LAB — TROPONIN I (HIGH SENSITIVITY)
Troponin I (High Sensitivity): 65 ng/L — ABNORMAL HIGH (ref <18)
Troponin I (High Sensitivity): 60 ng/L — ABNORMAL HIGH (ref <18)

## 2018-11-03 LAB — BRAIN NATRIURETIC PEPTIDE: B Natriuretic Peptide: >4500 pg/mL — ABNORMAL HIGH (ref 0.0–100.0)

## 2018-11-03 MED ORDER — IOHEXOL 350 MG/ML SOLN
100.0000 mL | Freq: Once | INTRAVENOUS | Status: AC | PRN
  Administered 2018-11-03: 23:00:00 80 mL via INTRAVENOUS

## 2018-11-03 MED ORDER — SODIUM CHLORIDE (PF) 0.9 % IJ SOLN
INTRAMUSCULAR | Status: AC
  Filled 2018-11-03: qty 50

## 2018-11-03 NOTE — ED Notes (Addendum)
Pt called out for increased shortness of breath Pt oxygen saturation is 98% however, pt had slight increased work of breathing and increase RR at 30. Pt stated in increased Keith Nguyen & Keith Nguyen when he felt to urge to urinate.  Pt now more relax and slowed breathing

## 2018-11-03 NOTE — ED Provider Notes (Signed)
[facility] DEPT Provider Note   CSN: [id number] Arrival date & time: 10/31/2018  [id number]     History   Chief Complaint Chief Complaint  Patient presents with  . Shortness of Breath    HPI Keith Nguyen. is a 68 y.o. male.     HPI Pt started having shortness of breath about a week ago.  Pt lives in [place] and is here visiting family.  Pt has had a mild dry cough.  No fevers.  No ill contacts.  No covid exposure.  He feels his sx are worse with lying flat.  Pt has history of CHF.  Pt has been taking his medications, he has not missed any.  NO leg swelling,   No CP.  He has noticed his neck veins are a little swollen. Past Medical History:  Diagnosis Date  . Abnormal cardiac function test 09/16/2015  . Aortic atherosclerosis ([place]) 09/16/2015  . History of hepatitis C 09/16/2015   Prior treatment with Harvoni   . Hyperlipidemia 09/16/2015  . Hypertensive heart disease without CHF 09/16/2015    Patient Active Problem List   Diagnosis Date Noted  . Abnormal nuclear stress test   . Cardiomyopathy ([place])   . Abnormal cardiac function test 09/16/2015  . Hypertensive heart disease without CHF 09/16/2015  . Hyperlipidemia 09/16/2015  . Aortic atherosclerosis ([place]) 09/16/2015  . History of hepatitis C 09/16/2015    Past Surgical History:  Procedure Laterality Date  . CARDIAC CATHETERIZATION N/A 09/22/2015   Procedure: Left Heart Cath and Coronary Angiography;  Surgeon: Troy Sine, MD;  Location: [facility];  Service: Cardiovascular;  Laterality: N/A;  . TONSILLECTOMY          Home Medications    Prior to Admission medications   Medication Sig Start Date End Date Taking? Authorizing Provider  albuterol (PROVENTIL HFA;VENTOLIN HFA) 108 (90 Base) MCG/ACT inhaler Inhale 1-2 puffs into the lungs every 6 (six) hours as needed for wheezing or shortness of breath. 03/24/18  Yes Wieters, Hallie C, PA-C  allopurinol (ZYLOPRIM) 100 MG tablet Take 100 mg  by mouth daily.   Yes [provider]  aspirin 81 MG tablet Take 81 mg by mouth daily.   Yes [provider]  furosemide (LASIX) 40 MG tablet Take 40 mg by mouth daily.   Yes [provider]  hydrALAZINE (APRESOLINE) 25 MG tablet Take 25 mg by mouth 2 (two) times daily.   Yes [provider]  losartan (COZAAR) 100 MG tablet Take 100 mg by mouth daily.   Yes [provider]  metoprolol succinate (TOPROL-XL) 100 MG 24 hr tablet Take 200 mg by mouth daily. Take with or immediately following a meal.   Yes [provider]    Family History Family History  Problem Relation Age of Onset  . Stroke Mother   . Lung cancer Father   . Hypertension Sister     Social History Social History   Tobacco Use  . Smoking status: Current Every Day Smoker  . Smokeless tobacco: Never Used  Substance Use Topics  . Alcohol use: Yes    Alcohol/week: 4.0 - 6.0 standard drinks    Types: 4 - 6 Shots of liquor per week    Comment: Pt drinks beer and liquor daily  . Drug use: No     Allergies   Lipitor [atorvastatin]   Review of Systems Review of Systems  All other systems reviewed and are negative.  Physical Exam Updated Vital Signs BP (!) 144/96   Pulse 62   Temp 98 F (36.7 C) (Oral)   Resp (!) 21   SpO2 93%   Physical Exam Vitals signs and nursing note reviewed.  Constitutional:      General: He is not in acute distress.    Appearance: He is well-developed.  HENT:     Head: Normocephalic and atraumatic.     Right Ear: External ear normal.     Left Ear: External ear normal.  Eyes:     General: No scleral icterus.       Right eye: No discharge.        Left eye: No discharge.     Conjunctiva/sclera: Conjunctivae normal.  Neck:     Musculoskeletal: Neck supple.     Vascular: JVD present.     Trachea: No tracheal deviation.  Cardiovascular:     Rate and Rhythm: Normal rate and regular rhythm.  Pulmonary:     Effort:  Pulmonary effort is normal. No respiratory distress.     Breath sounds: Normal breath sounds. No stridor. No wheezing or rales.  Abdominal:     General: Bowel sounds are normal. There is no distension.     Palpations: Abdomen is soft.     Tenderness: There is no abdominal tenderness. There is no guarding or rebound.  Musculoskeletal:        General: No tenderness.     Right lower leg: He exhibits no tenderness. No edema.     Left lower leg: He exhibits no tenderness. No edema.  Skin:    General: Skin is warm and dry.     Findings: No rash.  Neurological:     Mental Status: He is alert.     Cranial Nerves: No cranial nerve deficit (no facial droop, extraocular movements intact, no slurred speech).     Sensory: No sensory deficit.     Motor: No abnormal muscle tone or seizure activity.     Coordination: Coordination normal.      ED Treatments / Results  Labs (all labs ordered are listed, but only abnormal results are displayed) Labs Reviewed  CBC - Abnormal; Notable for the following components:      Result Value   RDW 16.9 (*)    All other components within normal limits  BASIC METABOLIC PANEL - Abnormal; Notable for the following components:   CO2 21 (*)    Creatinine, Ser 1.29 (*)    GFR calc non Af Amer 56 (*)    All other components within normal limits  BRAIN NATRIURETIC PEPTIDE - Abnormal; Notable for the following components:   B Natriuretic Peptide >4,500.0 (*)    All other components within normal limits  D-DIMER, QUANTITATIVE (NOT AT [facility]) - Abnormal; Notable for the following components:   D-Dimer, Quant 3.54 (*)    All other components within normal limits  TROPONIN I (HIGH SENSITIVITY) - Abnormal; Notable for the following components:   Troponin I (High Sensitivity) 65 (*)    All other components within normal limits  TROPONIN I (HIGH SENSITIVITY) - Abnormal; Notable for the following components:   Troponin I (High Sensitivity) 60 (*)    All other components  within normal limits  SARS CORONAVIRUS 2 (TAT 6-24 HRS)    EKG EKG Interpretation  Date/Time:  Monday November 03 2018 18:58:16 EDT Ventricular Rate:  73 PR Interval:    QRS Duration: 117 QT Interval:  435 QTC Calculation: 480 R Axis:   -  61 Text Interpretation:  Sinus rhythm Nonspecific IVCD with LAD LVH with secondary repolarization abnormality Anterior Q waves, possibly due to LVH No significant change since last tracing Confirmed by Dorie Rank [phone]) on 10/29/2018 8:19:33 PM   Radiology Dg Chest 2 View  Result Date: 11/14/2018 CLINICAL DATA:  Shortness of breath EXAM: CHEST - 2 VIEW COMPARISON:  September 16, 2015 FINDINGS: There is slight bibasilar atelectasis. Subtle patchy opacity noted in the posterior left base. There is no edema or consolidation. Heart is borderline enlarged with pulmonary vascularity normal. No adenopathy. There is aortic atherosclerosis. There is an old healed fracture of the left clavicle. IMPRESSION: Mild bibasilar atelectasis. Suspect small focus of pneumonia posterior left base. Lungs elsewhere clear. Mild cardiac enlargement. Aortic Atherosclerosis (ICD10-I70.0). Followup PA and lateral chest radiographs recommended in 3-4 weeks following trial of antibiotic therapy to ensure resolution and exclude underlying malignancy. Electronically Signed   By: Lowella Grip III M.D.   On: 11/10/2018 19:17   Ct Angio Chest Pe W And/or Wo Contrast  Result Date: 10/24/2018 CLINICAL DATA:  Short of breath, positive D-dimer, history of CHF EXAM: CT ANGIOGRAPHY CHEST WITH CONTRAST TECHNIQUE: Multidetector CT imaging of the chest was performed using the standard protocol during bolus administration of intravenous contrast. Multiplanar CT image reconstructions and MIPs were obtained to evaluate the vascular anatomy. CONTRAST:  30mL OMNIPAQUE IOHEXOL 350 MG/ML SOLN COMPARISON:  Radiograph 10/31/2018 FINDINGS: Cardiovascular: There is suboptimal opacification of the pulmonary  arteries likely due to right-sided heart failure with extensive mixing artifact seen within the central pulmonary arteries and poor opacification beyond the lobar levels. No convincing central or lobar arterial filling defect is seen. The aorta is dilated with the ascending aorta measuring up to 4.9 cm. The aorta remains unopacified therefore precluding luminal evaluation. Atheromatous plaque is present throughout the aorta predominantly the descending portions. There is pronounced cardiomegaly with biatrial enlargement. Contrast refluxes into the hepatic veins and into the azygos and hemi azygous. Atherosclerotic calcification of the coronary arteries. There is a small pericardial effusion. Mediastinum/Nodes: There are few low-attenuation, prominent lymph nodes, largest in the left axilla measuring up to 9 mm. No acute abnormality of the trachea or esophagus. Thyroid gland and thoracic inlet is unremarkable aside from distension of the jugular veins. Lungs/Pleura: Extensive respiratory motion artifact limits evaluation of the lung parenchyma. There is a moderate right and small left pleural effusion. A pickle paraseptal emphysematous change/bullous disease is noted. No consolidative process is evident. No pneumothorax. Upper Abdomen: There is a markedly nodular surface contour of the liver. Contrast refluxes prominently into the hepatic veins. Upper abdominal atherosclerotic calcification of the aorta is noted. There is bilateral perinephric stranding and a small volume fluid in the retroperitoneum. Musculoskeletal: Circumferential body wall edema is noted with a relative paucity of subcutaneous fat. There is mild bilateral gynecomastia. There is a likely chronic compression deformity the T11 vertebral body with approximately 50% height loss centrally. No acute or suspicious osseous lesions are evident. Review of the MIP images confirms the above findings. IMPRESSION: 1. Suboptimal opacification of the pulmonary  arteries likely due to right-sided heart failure with extensive mixing artifact seen within the central pulmonary arteries and poor opacification beyond the lobar levels. No convincing large central or lobar arterial filling defect. 2. Marked cardiomegaly with biatrial enlargement. 3. Markedly nodular surface contour of the liver, such finding could be seen with cirrhosis or hepatic congestion. 4. Features of anasarca including circumferential body wall edema, pericardial effusion, moderate right and small  left pleural effusion. 5. Probable chronic compression deformity of the T11 vertebral body with approximately 50% height loss centrally. Could correlate with point tenderness if there is concern for acute fracture. 6. Ascending thoracic aortic aneurysm. Recommend semi-annual imaging followup by CTA or MRA and referral to cardiothoracic surgery if not already obtained. This recommendation follows 2010 ACCF/AHA/AATS/ACR/ASA/SCA/SCAI/SIR/STS/SVM Guidelines for the Diagnosis and Management of Patients With Thoracic Aortic Disease. Circulation. 2010; [id number]. Aortic aneurysm NOS (ICD10-I71.9) 7. Aortic atherosclerosis (ICD10-I70.0). Electronically Signed   By: Lovena Le M.D.   On: 11/08/2018 23:52    Procedures .Critical Care Performed by: Dorie Rank, MD Authorized by: Dorie Rank, MD   Critical care provider statement:    Critical care time (minutes):  45   Critical care was time spent personally by me on the following activities:  Discussions with consultants, evaluation of patient's response to treatment, examination of patient, ordering and performing treatments and interventions, ordering and review of laboratory studies, ordering and review of radiographic studies, pulse oximetry, re-evaluation of patient's condition, obtaining history from patient or surrogate and review of old charts   (including critical care time)  Medications Ordered in ED Medications  sodium chloride (PF) 0.9 %  injection (has no administration in time range)  furosemide (LASIX) injection 40 mg (has no administration in time range)  nitroGLYCERIN (NITROSTAT) SL tablet 0.4 mg (has no administration in time range)  iohexol (OMNIPAQUE) 350 MG/ML injection 100 mL (80 mLs Intravenous Contrast Given 11/17/2018 2311)     Initial Impression / Assessment and Plan / ED Course  I have reviewed the triage vital signs and the nursing notes.  Pertinent labs & imaging results that were available during my care of the patient were reviewed by me and considered in my medical decision making (see chart for details). Clinical Course as of Nov 04 31  Tue Nov 04, 2018  0029 Labs notable for elevated BNP and a d-dimer.   [JK]  0030 CT scan findings reviewed.  No evidence of pulmonary embolism.  Findings suggestive of congestive heart failure with cardiomegaly and possible hepatic congestion.  Incidental aortic aneurysm findings and compression fracture noted.  These were discussed with the patient and his wife   [JK]    Clinical Course User Index [JK] Dorie Rank, MD    Patient presented to the emergency room for evaluation of shortness of breath.  Patient had notable JVD on exam.  Chest x-ray did not show any significant pulmonary edema.  CT scan to rule out pulmonary embolism demonstrates findings consistent with congestive heart failure.  Patient has significant cardiomegaly.  Has elevated troponins consistent with CHF.  Doubt acute coronary syndrome.  Patient has been started on IV diuretics and nitroglycerin.  Maintainin his oxygen sat with nasal cannula oxygen.  Still able to speak without difficulty.   Patient will require diuresis, and further evaluation.  Admit for further treatment.  Final Clinical Impressions(s) / ED Diagnoses   Final diagnoses:  Acute on chronic congestive heart failure, unspecified heart failure type ([id number])  Compression fracture of body of thoracic vertebra [facility])  Thoracic aortic aneurysm  without rupture (H[facility])      Dorie Rank, MD 11/04/18 [phone]

## 2018-11-03 NOTE — ED Notes (Signed)
IV team at bedside 

## 2018-11-03 NOTE — ED Triage Notes (Signed)
Per EMS pt worsening SOB over past week with hx of CHF. Denies cough. Lung sounds clear.

## 2018-11-03 NOTE — ED Notes (Signed)
Pt oxygen saturation rang from 90-93% on Room Air and Pt was [facility]. Pt placed on 2L nasal canula and currently 97%.

## 2018-11-04 ENCOUNTER — Observation Stay (HOSPITAL_COMMUNITY): Payer: 59

## 2018-11-04 ENCOUNTER — Other Ambulatory Visit: Payer: Self-pay

## 2018-11-04 ENCOUNTER — Encounter (HOSPITAL_COMMUNITY): Payer: Self-pay | Admitting: Internal Medicine

## 2018-11-04 DIAGNOSIS — Z01818 Encounter for other preprocedural examination: Secondary | ICD-10-CM | POA: Diagnosis not present

## 2018-11-04 DIAGNOSIS — I5041 Acute combined systolic (congestive) and diastolic (congestive) heart failure: Secondary | ICD-10-CM | POA: Diagnosis not present

## 2018-11-04 DIAGNOSIS — M4854XA Collapsed vertebra, not elsewhere classified, thoracic region, initial encounter for fracture: Secondary | ICD-10-CM | POA: Diagnosis not present

## 2018-11-04 DIAGNOSIS — E43 Unspecified severe protein-calorie malnutrition: Secondary | ICD-10-CM | POA: Diagnosis not present

## 2018-11-04 DIAGNOSIS — Z681 Body mass index (BMI) 19 or less, adult: Secondary | ICD-10-CM | POA: Diagnosis not present

## 2018-11-04 DIAGNOSIS — Z8619 Personal history of other infectious and parasitic diseases: Secondary | ICD-10-CM | POA: Diagnosis not present

## 2018-11-04 DIAGNOSIS — I130 Hypertensive heart and chronic kidney disease with heart failure and stage 1 through stage 4 chronic kidney disease, or unspecified chronic kidney disease: Secondary | ICD-10-CM | POA: Diagnosis not present

## 2018-11-04 DIAGNOSIS — Z87891 Personal history of nicotine dependence: Secondary | ICD-10-CM | POA: Diagnosis not present

## 2018-11-04 DIAGNOSIS — S22000A Wedge compression fracture of unspecified thoracic vertebra, initial encounter for closed fracture: Secondary | ICD-10-CM | POA: Diagnosis not present

## 2018-11-04 DIAGNOSIS — Z8249 Family history of ischemic heart disease and other diseases of the circulatory system: Secondary | ICD-10-CM | POA: Diagnosis not present

## 2018-11-04 DIAGNOSIS — N183 Chronic kidney disease, stage 3 unspecified: Secondary | ICD-10-CM | POA: Diagnosis not present

## 2018-11-04 DIAGNOSIS — I351 Nonrheumatic aortic (valve) insufficiency: Secondary | ICD-10-CM | POA: Diagnosis not present

## 2018-11-04 DIAGNOSIS — N179 Acute kidney failure, unspecified: Secondary | ICD-10-CM

## 2018-11-04 DIAGNOSIS — I440 Atrioventricular block, first degree: Secondary | ICD-10-CM | POA: Diagnosis present

## 2018-11-04 DIAGNOSIS — J90 Pleural effusion, not elsewhere classified: Secondary | ICD-10-CM | POA: Diagnosis not present

## 2018-11-04 DIAGNOSIS — Z515 Encounter for palliative care: Secondary | ICD-10-CM | POA: Diagnosis not present

## 2018-11-04 DIAGNOSIS — B182 Chronic viral hepatitis C: Secondary | ICD-10-CM | POA: Diagnosis not present

## 2018-11-04 DIAGNOSIS — I10 Essential (primary) hypertension: Secondary | ICD-10-CM | POA: Diagnosis not present

## 2018-11-04 DIAGNOSIS — E871 Hypo-osmolality and hyponatremia: Secondary | ICD-10-CM | POA: Diagnosis not present

## 2018-11-04 DIAGNOSIS — Z801 Family history of malignant neoplasm of trachea, bronchus and lung: Secondary | ICD-10-CM | POA: Diagnosis not present

## 2018-11-04 DIAGNOSIS — I340 Nonrheumatic mitral (valve) insufficiency: Secondary | ICD-10-CM | POA: Diagnosis not present

## 2018-11-04 DIAGNOSIS — I5043 Acute on chronic combined systolic (congestive) and diastolic (congestive) heart failure: Secondary | ICD-10-CM | POA: Diagnosis not present

## 2018-11-04 DIAGNOSIS — N189 Chronic kidney disease, unspecified: Secondary | ICD-10-CM

## 2018-11-04 DIAGNOSIS — I639 Cerebral infarction, unspecified: Secondary | ICD-10-CM | POA: Diagnosis not present

## 2018-11-04 DIAGNOSIS — I110 Hypertensive heart disease with heart failure: Secondary | ICD-10-CM | POA: Diagnosis not present

## 2018-11-04 DIAGNOSIS — R410 Disorientation, unspecified: Secondary | ICD-10-CM | POA: Diagnosis not present

## 2018-11-04 DIAGNOSIS — I5022 Chronic systolic (congestive) heart failure: Secondary | ICD-10-CM | POA: Diagnosis not present

## 2018-11-04 DIAGNOSIS — Z823 Family history of stroke: Secondary | ICD-10-CM | POA: Diagnosis not present

## 2018-11-04 DIAGNOSIS — E785 Hyperlipidemia, unspecified: Secondary | ICD-10-CM | POA: Diagnosis present

## 2018-11-04 DIAGNOSIS — K7689 Other specified diseases of liver: Secondary | ICD-10-CM | POA: Diagnosis not present

## 2018-11-04 DIAGNOSIS — I5021 Acute systolic (congestive) heart failure: Secondary | ICD-10-CM | POA: Diagnosis not present

## 2018-11-04 DIAGNOSIS — I712 Thoracic aortic aneurysm, without rupture: Secondary | ICD-10-CM | POA: Diagnosis present

## 2018-11-04 DIAGNOSIS — Z20828 Contact with and (suspected) exposure to other viral communicable diseases: Secondary | ICD-10-CM | POA: Diagnosis not present

## 2018-11-04 DIAGNOSIS — R570 Cardiogenic shock: Secondary | ICD-10-CM | POA: Diagnosis not present

## 2018-11-04 DIAGNOSIS — I5023 Acute on chronic systolic (congestive) heart failure: Secondary | ICD-10-CM | POA: Diagnosis not present

## 2018-11-04 DIAGNOSIS — R5383 Other fatigue: Secondary | ICD-10-CM | POA: Diagnosis not present

## 2018-11-04 DIAGNOSIS — I509 Heart failure, unspecified: Secondary | ICD-10-CM

## 2018-11-04 DIAGNOSIS — I361 Nonrheumatic tricuspid (valve) insufficiency: Secondary | ICD-10-CM | POA: Diagnosis not present

## 2018-11-04 DIAGNOSIS — I517 Cardiomegaly: Secondary | ICD-10-CM | POA: Diagnosis not present

## 2018-11-04 DIAGNOSIS — I700 Atherosclerosis of aorta: Secondary | ICD-10-CM | POA: Diagnosis present

## 2018-11-04 DIAGNOSIS — Z4682 Encounter for fitting and adjustment of non-vascular catheter: Secondary | ICD-10-CM | POA: Diagnosis not present

## 2018-11-04 DIAGNOSIS — E782 Mixed hyperlipidemia: Secondary | ICD-10-CM | POA: Diagnosis not present

## 2018-11-04 DIAGNOSIS — Z7982 Long term (current) use of aspirin: Secondary | ICD-10-CM | POA: Diagnosis not present

## 2018-11-04 DIAGNOSIS — J449 Chronic obstructive pulmonary disease, unspecified: Secondary | ICD-10-CM | POA: Diagnosis not present

## 2018-11-04 DIAGNOSIS — I428 Other cardiomyopathies: Secondary | ICD-10-CM | POA: Diagnosis present

## 2018-11-04 DIAGNOSIS — Z888 Allergy status to other drugs, medicaments and biological substances status: Secondary | ICD-10-CM | POA: Diagnosis not present

## 2018-11-04 DIAGNOSIS — J9601 Acute respiratory failure with hypoxia: Secondary | ICD-10-CM | POA: Diagnosis not present

## 2018-11-04 DIAGNOSIS — N19 Unspecified kidney failure: Secondary | ICD-10-CM | POA: Diagnosis not present

## 2018-11-04 DIAGNOSIS — K8020 Calculus of gallbladder without cholecystitis without obstruction: Secondary | ICD-10-CM | POA: Diagnosis not present

## 2018-11-04 DIAGNOSIS — K8689 Other specified diseases of pancreas: Secondary | ICD-10-CM | POA: Diagnosis not present

## 2018-11-04 DIAGNOSIS — I4892 Unspecified atrial flutter: Secondary | ICD-10-CM | POA: Diagnosis not present

## 2018-11-04 DIAGNOSIS — Z95811 Presence of heart assist device: Secondary | ICD-10-CM | POA: Diagnosis not present

## 2018-11-04 DIAGNOSIS — J9811 Atelectasis: Secondary | ICD-10-CM | POA: Diagnosis not present

## 2018-11-04 DIAGNOSIS — K7030 Alcoholic cirrhosis of liver without ascites: Secondary | ICD-10-CM | POA: Diagnosis not present

## 2018-11-04 DIAGNOSIS — I313 Pericardial effusion (noninflammatory): Secondary | ICD-10-CM | POA: Diagnosis not present

## 2018-11-04 DIAGNOSIS — K7460 Unspecified cirrhosis of liver: Secondary | ICD-10-CM | POA: Diagnosis not present

## 2018-11-04 DIAGNOSIS — Z66 Do not resuscitate: Secondary | ICD-10-CM | POA: Diagnosis not present

## 2018-11-04 LAB — HEPATIC FUNCTION PANEL
ALT: 15 U/L (ref 0–44)
AST: 46 U/L — ABNORMAL HIGH (ref 15–41)
Albumin: 3.8 g/dL (ref 3.5–5.0)
Alkaline Phosphatase: 67 U/L (ref 38–126)
Bilirubin, Direct: 0.6 mg/dL — ABNORMAL HIGH (ref 0.0–0.2)
Indirect Bilirubin: 1.2 mg/dL — ABNORMAL HIGH (ref 0.3–0.9)
Total Bilirubin: 1.8 mg/dL — ABNORMAL HIGH (ref 0.3–1.2)
Total Protein: 8.5 g/dL — ABNORMAL HIGH (ref 6.5–8.1)

## 2018-11-04 LAB — CBC
HCT: 48.7 % (ref 39.0–52.0)
Hemoglobin: 15.3 g/dL (ref 13.0–17.0)
MCH: 29.8 pg (ref 26.0–34.0)
MCHC: 31.4 g/dL (ref 30.0–36.0)
MCV: 94.7 fL (ref 80.0–100.0)
Platelets: 201 10*3/uL (ref 150–400)
RBC: 5.14 MIL/uL (ref 4.22–5.81)
RDW: 16.7 % — ABNORMAL HIGH (ref 11.5–15.5)
WBC: 4.2 10*3/uL (ref 4.0–10.5)
nRBC: 0 % (ref 0.0–0.2)

## 2018-11-04 LAB — URINALYSIS, COMPLETE (UACMP) WITH MICROSCOPIC
Bacteria, UA: NONE SEEN
Bilirubin Urine: NEGATIVE
Glucose, UA: NEGATIVE mg/dL
Hgb urine dipstick: NEGATIVE
Ketones, ur: NEGATIVE mg/dL
Leukocytes,Ua: NEGATIVE
Nitrite: NEGATIVE
Protein, ur: 30 mg/dL — AB
Specific Gravity, Urine: 1.028 (ref 1.005–1.030)
pH: 5 (ref 5.0–8.0)

## 2018-11-04 LAB — BASIC METABOLIC PANEL
Anion gap: 11 (ref 5–15)
Anion gap: 17 — ABNORMAL HIGH (ref 5–15)
BUN: 20 mg/dL (ref 8–23)
BUN: 23 mg/dL (ref 8–23)
CO2: 20 mmol/L — ABNORMAL LOW (ref 22–32)
CO2: 16 mmol/L — ABNORMAL LOW (ref 22–32)
Calcium: 9.4 mg/dL (ref 8.9–10.3)
Calcium: 9.4 mg/dL (ref 8.9–10.3)
Chloride: 103 mmol/L (ref 98–111)
Chloride: 104 mmol/L (ref 98–111)
Creatinine, Ser: 1.81 mg/dL — ABNORMAL HIGH (ref 0.61–1.24)
Creatinine, Ser: 1.81 mg/dL — ABNORMAL HIGH (ref 0.61–1.24)
GFR calc Af Amer: 43 mL/min — ABNORMAL LOW (ref >60)
GFR calc Af Amer: 43 mL/min — ABNORMAL LOW (ref >60)
GFR calc non Af Amer: 37 mL/min — ABNORMAL LOW (ref >60)
GFR calc non Af Amer: 37 mL/min — ABNORMAL LOW (ref >60)
Glucose, Bld: 109 mg/dL — ABNORMAL HIGH (ref 70–99)
Glucose, Bld: 61 mg/dL — ABNORMAL LOW (ref 70–99)
Potassium: 6 mmol/L — ABNORMAL HIGH (ref 3.5–5.1)
Potassium: 5.9 mmol/L — ABNORMAL HIGH (ref 3.5–5.1)
Sodium: 134 mmol/L — ABNORMAL LOW (ref 135–145)
Sodium: 137 mmol/L (ref 135–145)

## 2018-11-04 LAB — HIV ANTIBODY (ROUTINE TESTING W REFLEX): HIV Screen 4th Generation wRfx: NONREACTIVE

## 2018-11-04 LAB — SARS CORONAVIRUS 2 (TAT 6-24 HRS): SARS Coronavirus 2: NEGATIVE

## 2018-11-04 LAB — TSH: TSH: 10.048 u[IU]/mL — ABNORMAL HIGH (ref 0.350–4.500)

## 2018-11-04 LAB — ECHOCARDIOGRAM COMPLETE

## 2018-11-04 MED ORDER — NITROGLYCERIN 2 % TD OINT
1.0000 [in_us] | TOPICAL_OINTMENT | Freq: Four times a day (QID) | TRANSDERMAL | Status: DC
  Administered 2018-11-04 – 2018-11-05 (×3): 1 [in_us] via TOPICAL
  Filled 2018-11-04 (×3): qty 30

## 2018-11-04 MED ORDER — ALBUTEROL SULFATE HFA 108 (90 BASE) MCG/ACT IN AERS: 1.0000 | INHALATION_SPRAY | Freq: Four times a day (QID) | RESPIRATORY_TRACT | Status: DC | PRN

## 2018-11-04 MED ORDER — IPRATROPIUM-ALBUTEROL 0.5-2.5 (3) MG/3ML IN SOLN
3.0000 mL | Freq: Three times a day (TID) | RESPIRATORY_TRACT | Status: DC
  Administered 2018-11-04 (×2): 3 mL via RESPIRATORY_TRACT
  Filled 2018-11-04 (×3): qty 3

## 2018-11-04 MED ORDER — HYDRALAZINE HCL 25 MG PO TABS
25.0000 mg | ORAL_TABLET | Freq: Two times a day (BID) | ORAL | Status: DC
  Administered 2018-11-04 – 2018-11-05 (×3): 25 mg via ORAL
  Filled 2018-11-04 (×4): qty 1

## 2018-11-04 MED ORDER — IPRATROPIUM-ALBUTEROL 0.5-2.5 (3) MG/3ML IN SOLN: 3.0000 mL | RESPIRATORY_TRACT | Status: DC

## 2018-11-04 MED ORDER — ONDANSETRON HCL 4 MG/2ML IJ SOLN: 4.0000 mg | Freq: Four times a day (QID) | INTRAMUSCULAR | Status: DC | PRN

## 2018-11-04 MED ORDER — ACETAMINOPHEN 325 MG PO TABS
650.0000 mg | ORAL_TABLET | Freq: Four times a day (QID) | ORAL | Status: DC | PRN
  Administered 2018-11-04: 650 mg via ORAL
  Filled 2018-11-04: qty 2

## 2018-11-04 MED ORDER — ASPIRIN EC 81 MG PO TBEC
81.0000 mg | DELAYED_RELEASE_TABLET | Freq: Every day | ORAL | Status: DC
  Administered 2018-11-04 – 2018-11-16 (×11): 81 mg via ORAL
  Filled 2018-11-04 (×13): qty 1

## 2018-11-04 MED ORDER — METOPROLOL SUCCINATE ER 100 MG PO TB24
200.0000 mg | ORAL_TABLET | Freq: Every day | ORAL | Status: DC
  Filled 2018-11-04: qty 2

## 2018-11-04 MED ORDER — NITROGLYCERIN 2 % TD OINT
TOPICAL_OINTMENT | TRANSDERMAL | Status: AC
  Filled 2018-11-04: qty 1

## 2018-11-04 MED ORDER — IPRATROPIUM-ALBUTEROL 0.5-2.5 (3) MG/3ML IN SOLN: 3.0000 mL | RESPIRATORY_TRACT | Status: DC | PRN

## 2018-11-04 MED ORDER — NITROGLYCERIN 0.4 MG SL SUBL
0.4000 mg | SUBLINGUAL_TABLET | SUBLINGUAL | Status: DC | PRN
  Administered 2018-11-04 (×2): 0.4 mg via SUBLINGUAL
  Filled 2018-11-04 (×2): qty 1

## 2018-11-04 MED ORDER — ALBUTEROL SULFATE (2.5 MG/3ML) 0.083% IN NEBU: 2.5000 mg | INHALATION_SOLUTION | Freq: Four times a day (QID) | RESPIRATORY_TRACT | Status: DC | PRN

## 2018-11-04 MED ORDER — FUROSEMIDE 10 MG/ML IJ SOLN
40.0000 mg | Freq: Once | INTRAMUSCULAR | Status: AC
  Administered 2018-11-04: 01:00:00 40 mg via INTRAVENOUS
  Filled 2018-11-04: qty 4

## 2018-11-04 MED ORDER — IPRATROPIUM-ALBUTEROL 0.5-2.5 (3) MG/3ML IN SOLN
3.0000 mL | Freq: Two times a day (BID) | RESPIRATORY_TRACT | Status: DC
  Filled 2018-11-04: qty 3

## 2018-11-04 MED ORDER — FUROSEMIDE 10 MG/ML IJ SOLN
40.0000 mg | Freq: Two times a day (BID) | INTRAMUSCULAR | Status: DC
  Administered 2018-11-04: 09:00:00 40 mg via INTRAVENOUS
  Filled 2018-11-04: qty 4

## 2018-11-04 MED ORDER — METOPROLOL SUCCINATE ER 100 MG PO TB24
100.0000 mg | ORAL_TABLET | Freq: Every day | ORAL | Status: DC
  Administered 2018-11-04 – 2018-11-05 (×2): 100 mg via ORAL
  Filled 2018-11-04 (×3): qty 1

## 2018-11-04 MED ORDER — FUROSEMIDE 10 MG/ML IJ SOLN
12.0000 mg/h | INTRAVENOUS | Status: DC
  Administered 2018-11-04: 4 mg/h via INTRAVENOUS
  Administered 2018-11-05 – 2018-11-07 (×3): 12 mg/h via INTRAVENOUS
  Filled 2018-11-04: qty 25
  Filled 2018-11-04: qty 21
  Filled 2018-11-04 (×3): qty 25

## 2018-11-04 MED ORDER — ALLOPURINOL 100 MG PO TABS
100.0000 mg | ORAL_TABLET | Freq: Every day | ORAL | Status: DC
  Administered 2018-11-04 – 2018-11-23 (×18): 100 mg via ORAL
  Filled 2018-11-04 (×19): qty 1

## 2018-11-04 MED ORDER — LOSARTAN POTASSIUM 50 MG PO TABS
100.0000 mg | ORAL_TABLET | Freq: Every day | ORAL | Status: DC
  Filled 2018-11-04: qty 2

## 2018-11-04 MED ORDER — ONDANSETRON HCL 4 MG PO TABS: 4.0000 mg | ORAL_TABLET | Freq: Four times a day (QID) | ORAL | Status: DC | PRN

## 2018-11-04 MED ORDER — ENOXAPARIN SODIUM 40 MG/0.4ML ~~LOC~~ SOLN
40.0000 mg | SUBCUTANEOUS | Status: DC
  Filled 2018-11-04: qty 0.4

## 2018-11-04 MED ORDER — ACETAMINOPHEN 650 MG RE SUPP: 650.0000 mg | Freq: Four times a day (QID) | RECTAL | Status: DC | PRN

## 2018-11-04 NOTE — H&P (Addendum)
History and Physical    Keith Nguyen. BN:[id number] DOB: 13-Jan-1951 DOA: 11/02/2018  PCP: [name], MD  Patient coming from: Home.  Chief Complaint: Shortness of breath.  HPI: Keith Nguyen. is a 68 y.o. male with history of systolic heart failure per cardiac cath done in 2017 which showed EF of 35 to 40% has been experiencing increasing shortness of breath more than usual over the last 1 month.  Patient presently lives in [place].  Patient has been following up with his primary care physician who was requesting records from his primary care physician from [place].  Patient has just come back to [place] to visit his daughter over the last 3 days.  During that patient has developed increasing shortness of breath no chest pain has been having some nonproductive cough denies any fever chills.  Shortness of breath is present even at rest.  Has noticed increasing distended jugular veins.  Patient has been taking Lasix for a long time.  ED Course: In the ER on exam patient has elevated JVD with air entry being tight on bilateral lung exam.  His d-dimer was elevated CT angiogram of the chest was done which is negative for pulmonary embolism but does show features of anasarca with effusion moderate right and small left pleural effusion.  Also seen was which is concerning for right heart failure and possible cirrhosis.  Patient was given Lasix 40 mg IV in the ER and admitted for further management.  Other labs show BNP of more than 4500 high-sensitivity troponin was 65 and 60.  Hemoglobin 15.2 platelets 180 creatinine 1.2.  COVID-19 test was negative.  EKG was showing normal sinus rhythm with LVH.  IVCD.  Review of Systems: As per HPI, rest all negative.   Past Medical History:  Diagnosis Date  . Abnormal cardiac function test 09/16/2015  . Aortic atherosclerosis ([place]) 09/16/2015  . History of hepatitis C 09/16/2015   Prior treatment with Harvoni   . Hyperlipidemia 09/16/2015  .  Hypertensive heart disease without CHF 09/16/2015    Past Surgical History:  Procedure Laterality Date  . CARDIAC CATHETERIZATION N/A 09/22/2015   Procedure: Left Heart Cath and Coronary Angiography;  Surgeon: Troy Sine, MD;  Location: [facility];  Service: Cardiovascular;  Laterality: N/A;  . TONSILLECTOMY       reports that he has been smoking. He has never used smokeless tobacco. He reports current alcohol use of about 4.0 - 6.0 standard drinks of alcohol per week. He reports that he does not use drugs.  Allergies  Allergen Reactions  . Lipitor [Atorvastatin]     Elevated liver enzymes    Family History  Problem Relation Age of Onset  . Stroke Mother   . Lung cancer Father   . Hypertension Sister     Prior to Admission medications   Medication Sig Start Date End Date Taking? Authorizing Provider  albuterol (PROVENTIL HFA;VENTOLIN HFA) 108 (90 Base) MCG/ACT inhaler Inhale 1-2 puffs into the lungs every 6 (six) hours as needed for wheezing or shortness of breath. 03/24/18  Yes Wieters, Hallie C, PA-C  allopurinol (ZYLOPRIM) 100 MG tablet Take 100 mg by mouth daily.   Yes [provider]  aspirin 81 MG tablet Take 81 mg by mouth daily.   Yes [provider]  furosemide (LASIX) 40 MG tablet Take 40 mg by mouth daily.   Yes [provider]  hydrALAZINE (APRESOLINE) 25 MG tablet Take 25 mg by mouth 2 (two)  times daily.   Yes [provider]  losartan (COZAAR) 100 MG tablet Take 100 mg by mouth daily.   Yes [provider]  metoprolol succinate (TOPROL-XL) 100 MG 24 hr tablet Take 200 mg by mouth daily. Take with or immediately following a meal.   Yes [provider]    Physical Exam: Constitutional: Moderately built and nourished. Vitals:   11/02/2018 2144 11/08/2018 2232 11/04/18 0007 11/04/18 0039  BP:  (!) 128/98 (!) 144/96 (!) 136/104  Pulse: 63 69 62 60  Resp: 18 (!) 36 (!) 21 (!) 22  Temp:      TempSrc:       SpO2: 99% 93%  96%   Eyes: Anicteric no pallor. ENMT: No discharge from the ears eyes nose or mouth. Neck: JVD elevated. Respiratory: Bilateral air entry is tight. Cardiovascular: S1-S2 heard. Abdomen: Soft nontender bowel sounds present. Musculoskeletal: No edema. Skin: No rash. Neurologic: Alert awake oriented to time place and person.  Moves all extremities. Psychiatric: Appears normal.   Labs on Admission: I have personally reviewed following labs and imaging studies  CBC: Recent Labs  Lab 11/16/2018 2018  WBC 4.2  HGB 15.2  HCT 49.2  MCV 95.7  PLT 99991111   Basic Metabolic Panel: Recent Labs  Lab 11/17/2018 2018  NA 136  K 4.8  CL 103  CO2 21*  GLUCOSE 99  BUN 18  CREATININE 1.29*  CALCIUM 9.5   GFR: CrCl cannot be calculated (Unknown ideal weight.). Liver Function Tests: No results for input(s): AST, ALT, ALKPHOS, BILITOT, PROT, ALBUMIN in the last 168 hours. No results for input(s): LIPASE, AMYLASE in the last 168 hours. No results for input(s): AMMONIA in the last 168 hours. Coagulation Profile: No results for input(s): INR, PROTIME in the last 168 hours. Cardiac Enzymes: No results for input(s): CKTOTAL, CKMB, CKMBINDEX, TROPONINI in the last 168 hours. BNP (last 3 results) No results for input(s): PROBNP in the last 8760 hours. HbA1C: No results for input(s): HGBA1C in the last 72 hours. CBG: No results for input(s): GLUCAP in the last 168 hours. Lipid Profile: No results for input(s): CHOL, HDL, LDLCALC, TRIG, CHOLHDL, LDLDIRECT in the last 72 hours. Thyroid Function Tests: No results for input(s): TSH, T4TOTAL, FREET4, T3FREE, THYROIDAB in the last 72 hours. Anemia Panel: No results for input(s): VITAMINB12, FOLATE, FERRITIN, TIBC, IRON, RETICCTPCT in the last 72 hours. Urine analysis: No results found for: COLORURINE, APPEARANCEUR, LABSPEC, PHURINE, GLUCOSEU, HGBUR, BILIRUBINUR, KETONESUR, PROTEINUR, UROBILINOGEN, NITRITE, LEUKOCYTESUR Sepsis Labs:  @LABRCNTIP (procalcitonin:4,lacticidven:4) )No results found for this or any previous visit (from the past 240 hour(s)).   Radiological Exams on Admission: Dg Chest 2 View  Result Date: 10/26/2018 CLINICAL DATA:  Shortness of breath EXAM: CHEST - 2 VIEW COMPARISON:  September 16, 2015 FINDINGS: There is slight bibasilar atelectasis. Subtle patchy opacity noted in the posterior left base. There is no edema or consolidation. Heart is borderline enlarged with pulmonary vascularity normal. No adenopathy. There is aortic atherosclerosis. There is an old healed fracture of the left clavicle. IMPRESSION: Mild bibasilar atelectasis. Suspect small focus of pneumonia posterior left base. Lungs elsewhere clear. Mild cardiac enlargement. Aortic Atherosclerosis (ICD10-I70.0). Followup PA and lateral chest radiographs recommended in 3-4 weeks following trial of antibiotic therapy to ensure resolution and exclude underlying malignancy. Electronically Signed   By: Lowella Grip III M.D.   On: 11/13/2018 19:17   Ct Angio Chest Pe W And/or Wo Contrast  Result Date: 11/04/2018 CLINICAL DATA:  Short of breath, positive  D-dimer, history of CHF EXAM: CT ANGIOGRAPHY CHEST WITH CONTRAST TECHNIQUE: Multidetector CT imaging of the chest was performed using the standard protocol during bolus administration of intravenous contrast. Multiplanar CT image reconstructions and MIPs were obtained to evaluate the vascular anatomy. CONTRAST:  84mL OMNIPAQUE IOHEXOL 350 MG/ML SOLN COMPARISON:  Radiograph 11/04/2018 FINDINGS: Cardiovascular: There is suboptimal opacification of the pulmonary arteries likely due to right-sided heart failure with extensive mixing artifact seen within the central pulmonary arteries and poor opacification beyond the lobar levels. No convincing central or lobar arterial filling defect is seen. The aorta is dilated with the ascending aorta measuring up to 4.9 cm. The aorta remains unopacified therefore precluding  luminal evaluation. Atheromatous plaque is present throughout the aorta predominantly the descending portions. There is pronounced cardiomegaly with biatrial enlargement. Contrast refluxes into the hepatic veins and into the azygos and hemi azygous. Atherosclerotic calcification of the coronary arteries. There is a small pericardial effusion. Mediastinum/Nodes: There are few low-attenuation, prominent lymph nodes, largest in the left axilla measuring up to 9 mm. No acute abnormality of the trachea or esophagus. Thyroid gland and thoracic inlet is unremarkable aside from distension of the jugular veins. Lungs/Pleura: Extensive respiratory motion artifact limits evaluation of the lung parenchyma. There is a moderate right and small left pleural effusion. A pickle paraseptal emphysematous change/bullous disease is noted. No consolidative process is evident. No pneumothorax. Upper Abdomen: There is a markedly nodular surface contour of the liver. Contrast refluxes prominently into the hepatic veins. Upper abdominal atherosclerotic calcification of the aorta is noted. There is bilateral perinephric stranding and a small volume fluid in the retroperitoneum. Musculoskeletal: Circumferential body wall edema is noted with a relative paucity of subcutaneous fat. There is mild bilateral gynecomastia. There is a likely chronic compression deformity the T11 vertebral body with approximately 50% height loss centrally. No acute or suspicious osseous lesions are evident. Review of the MIP images confirms the above findings. IMPRESSION: 1. Suboptimal opacification of the pulmonary arteries likely due to right-sided heart failure with extensive mixing artifact seen within the central pulmonary arteries and poor opacification beyond the lobar levels. No convincing large central or lobar arterial filling defect. 2. Marked cardiomegaly with biatrial enlargement. 3. Markedly nodular surface contour of the liver, such finding could be  seen with cirrhosis or hepatic congestion. 4. Features of anasarca including circumferential body wall edema, pericardial effusion, moderate right and small left pleural effusion. 5. Probable chronic compression deformity of the T11 vertebral body with approximately 50% height loss centrally. Could correlate with point tenderness if there is concern for acute fracture. 6. Ascending thoracic aortic aneurysm. Recommend semi-annual imaging followup by CTA or MRA and referral to cardiothoracic surgery if not already obtained. This recommendation follows 2010 ACCF/AHA/AATS/ACR/ASA/SCA/SCAI/SIR/STS/SVM Guidelines for the Diagnosis and Management of Patients With Thoracic Aortic Disease. Circulation. 2010; [id number]. Aortic aneurysm NOS (ICD10-I71.9) 7. Aortic atherosclerosis (ICD10-I70.0). Electronically Signed   By: Lovena Le M.D.   On: 11/13/2018 23:52    EKG: Independently reviewed.  Normal sinus rhythm with IVCD and LVH findings.  Assessment/Plan Principal Problem:   Acute CHF (congestive heart failure) (HCC) Active Problems:   Hyperlipidemia   History of hepatitis C   Essential hypertension    1. Acute on chronic systolic heart failure last EF measured during cardiac cath in 2017 was 35 to 40% presently placed on Lasix 40 mg IV every 12 continue ARB Toprol-XL follow intake output metabolic panel daily weights 2D echo and cardiology consulted.  Check TSH. 2.  Possible liver cirrhosis could be from history of hepatitis C.  Patient states he was treated for hepatitis C.  Check LFTs. 3. Hypertension on hydralazine and ARB and Toprol-XL. 4. COPD presently not wheezing but air entry is tight we will keep patient on scheduled dose on nebulizer. 5. Tobacco abuse advised about quitting. 6. Possible chronic kidney disease stage II no other patient is on Lasix and ARB. 7. Patient states he only drinks alcohol once or twice a week.   DVT prophylaxis: Since patient has pericardial effusion will hold  off Lovenox until we get 2D echo.  No definite signs of tamponade. Code Status: Full code. Family Communication: Patient's wife. Disposition Plan: Home. Consults called: Cardiology. Admission status: Inpatient.   Rise Patience MD [facility] Pager [phone].  If 7PM-7AM, please contact night-coverage [email or web address] Password [email or web address]  11/04/2018, 1:58 AM

## 2018-11-04 NOTE — Progress Notes (Signed)
Received pt from ED, with labored breathing with activity. Pt SOB on 02 at 2l. Wife at bedside. Pt recovers slowly, O2 sat 90's with exertion, and 97% baseline in 2 l.

## 2018-11-04 NOTE — ED Notes (Signed)
Pt dtr is at bedside

## 2018-11-04 NOTE — Progress Notes (Signed)
  Echocardiogram 2D Echocardiogram has been performed.  Keith Nguyen 11/04/2018, 2:05 PM

## 2018-11-04 NOTE — ED Notes (Signed)
ED TO INPATIENT HANDOFF REPORT  ED Nurse Name and Phone #: Anderson Malta [phone]   S Name/Age/Gender Keith Nguyen. 68 y.o. male Room/Bed: WA07/WA07  Code Status   Code Status: Full Code  Home/SNF/Other Home Patient oriented to: self, place, time and situation Is this baseline? Yes   Triage Complete: Triage complete  Chief Complaint Shortness of Breath  Triage Note Per EMS pt worsening SOB over past week with hx of CHF. Denies cough. Lung sounds clear.   Allergies Allergies  Allergen Reactions  . Lipitor [Atorvastatin]     Elevated liver enzymes    Level of Care/Admitting Diagnosis ED Disposition    ED Disposition Condition [facility]: [facility] [[id number]]  Level of Care: Telemetry [5]  Admit to tele based on following criteria: Acute CHF  Covid Evaluation: Asymptomatic Screening Protocol (No Symptoms)  Diagnosis: Acute CHF (congestive heart failure) [facility]) [id number]  Admitting Physician: Rise Patience [phone]  Attending Physician: Rise Patience [email or web address]  PT Class (Do Not Modify): Observation [104]  PT Acc Code (Do Not Modify): Observation [10022]       B Medical/Surgery History Past Medical History:  Diagnosis Date  . Abnormal cardiac function test 09/16/2015  . Aortic atherosclerosis ([place]) 09/16/2015  . History of hepatitis C 09/16/2015   Prior treatment with Harvoni   . Hyperlipidemia 09/16/2015  . Hypertensive heart disease without CHF 09/16/2015   Past Surgical History:  Procedure Laterality Date  . CARDIAC CATHETERIZATION N/A 09/22/2015   Procedure: Left Heart Cath and Coronary Angiography;  Surgeon: Troy Sine, MD;  Location: [facility];  Service: Cardiovascular;  Laterality: N/A;  . TONSILLECTOMY       A IV Location/Drains/Wounds Patient Lines/Drains/Airways Status   Active Line/Drains/Airways    Name:   Placement date:   Placement time:   Site:   Days:   Peripheral IV 11/07/2018 Left  Antecubital   11/02/2018    2015    Antecubital   1   Peripheral IV 11/07/2018 Right;Upper Arm   10/26/2018    2245    Arm   1          Intake/Output Last 24 hours No intake or output data in the 24 hours ending 11/04/18 1206  Labs/Imaging Results for orders placed or performed during the hospital encounter of 11/09/2018 (from the past 48 hour(s))  CBC     Status: Abnormal   Collection Time: 11/02/2018  8:18 PM  Result Value Ref Range   WBC 4.2 4.0 - 10.5 K/uL   RBC 5.14 4.22 - 5.81 MIL/uL   Hemoglobin 15.2 13.0 - 17.0 g/dL   HCT 49.2 39.0 - 52.0 %   MCV 95.7 80.0 - 100.0 fL   MCH 29.6 26.0 - 34.0 pg   MCHC 30.9 30.0 - 36.0 g/dL   RDW 16.9 (H) 11.5 - 15.5 %   Platelets 180 150 - 400 K/uL   nRBC 0.0 0.0 - 0.2 %    Comment: Performed at [facility], [place] [address] [place], [place] [id number]  Basic metabolic panel     Status: Abnormal   Collection Time: 11/04/2018  8:18 PM  Result Value Ref Range   Sodium 136 135 - 145 mmol/L   Potassium 4.8 3.5 - 5.1 mmol/L   Chloride 103 98 - 111 mmol/L   CO2 21 (L) 22 - 32 mmol/L   Glucose, Bld 99 70 - 99 mg/dL   BUN 18 8 -  23 mg/dL   Creatinine, Ser 1.29 (H) 0.61 - 1.24 mg/dL   Calcium 9.5 8.9 - 10.3 mg/dL   GFR calc non Af Amer 56 (L) >60 mL/min   GFR calc Af Amer >60 >60 mL/min   Anion gap 12 5 - 15    Comment: Performed at [facility], [place] [address] [place], [place] [address]  D-dimer, quantitative (not at [facility])     Status: Abnormal   Collection Time: 11/09/2018  8:18 PM  Result Value Ref Range   D-Dimer, Quant 3.54 (H) 0.00 - 0.50 ug/mL-FEU    Comment: (NOTE) At the manufacturer cut-off of 0.50 ug/mL FEU, this assay has been documented to exclude PE with a sensitivity and negative predictive value of 97 to 99%.  At this time, this assay has not been approved by the FDA to exclude DVT/VTE. Results should be correlated with clinical presentation. Performed at [facility], [place]  [address] [place], [place] [address]   Troponin I (High Sensitivity)     Status: Abnormal   Collection Time: 11/22/2018  8:18 PM  Result Value Ref Range   Troponin I (High Sensitivity) 65 (H) <18 ng/L    Comment: (NOTE) Elevated high sensitivity troponin I (hsTnI) values and significant  changes across serial measurements may suggest ACS but many other  chronic and acute conditions are known to elevate hsTnI results.  Refer to the "Links" section for chest pain algorithms and additional  guidance. Performed at [facility], [place] [address], [place], [place] [address]   Brain natriuretic peptide     Status: Abnormal   Collection Time: 11/02/2018  8:19 PM  Result Value Ref Range   B Natriuretic Peptide >4,500.0 (H) 0.0 - 100.0 pg/mL    Comment: Performed at [facility], [place] [address], [place], [place] [address]  Troponin I (High Sensitivity)     Status: Abnormal   Collection Time: 11/07/2018 10:41 PM  Result Value Ref Range   Troponin I (High Sensitivity) 60 (H) <18 ng/L    Comment: (NOTE) Elevated high sensitivity troponin I (hsTnI) values and significant  changes across serial measurements may suggest ACS but many other  chronic and acute conditions are known to elevate hsTnI results.  Refer to the "Links" section for chest pain algorithms and additional  guidance. Performed at [facility], [place] [address], [place], [place] [address]   SARS CORONAVIRUS 2 (TAT 6-24 HRS) Nasopharyngeal Nasopharyngeal Swab     Status: None   Collection Time: 11/04/18 12:29 AM   Specimen: Nasopharyngeal Swab  Result Value Ref Range   SARS Coronavirus 2 NEGATIVE NEGATIVE    Comment: (NOTE) SARS-CoV-2 target nucleic acids are NOT DETECTED. The SARS-CoV-2 RNA is generally detectable in upper and lower respiratory specimens during the acute phase of infection. Negative results do not preclude SARS-CoV-2 infection, do not rule out co-infections  with other pathogens, and should not be used as the sole basis for treatment or other patient management decisions. Negative results must be combined with clinical observations, patient history, and epidemiological information. The expected result is Negative. Fact Sheet for Patients: SugarRoll.be Fact Sheet for Healthcare Providers: https://www.woods-mathews.com/ This test is not yet approved or cleared by the [place] FDA and  has been authorized for detection and/or diagnosis of SARS-CoV-2 by FDA under an Emergency Use Authorization (EUA). This EUA will remain  in effect (meaning this test can be used) for the duration of the COVID-19 declaration under  Section 56 4(b)(1) of the Act, 21 U.S.C. section 360bbb-3(b)(1), unless the authorization is terminated or revoked sooner. Performed at [facility], [place] [address] [place], [place] [address]   Hepatic function panel     Status: Abnormal   Collection Time: 11/04/18  6:11 AM  Result Value Ref Range   Total Protein 8.5 (H) 6.5 - 8.1 g/dL   Albumin 3.8 3.5 - 5.0 g/dL   AST 46 (H) 15 - 41 U/L   ALT 15 0 - 44 U/L   Alkaline Phosphatase 67 38 - 126 U/L   Total Bilirubin 1.8 (H) 0.3 - 1.2 mg/dL   Bilirubin, Direct 0.6 (H) 0.0 - 0.2 mg/dL   Indirect Bilirubin 1.2 (H) 0.3 - 0.9 mg/dL    Comment: Performed at [facility], [place] [address], [place], [place] [address]  HIV Antibody (routine testing w rflx)     Status: None   Collection Time: 11/04/18  6:11 AM  Result Value Ref Range   HIV Screen 4th Generation wRfx NON REACTIVE NON REACTIVE    Comment: Performed at [place] [address], [place], [place] [id number]  Basic metabolic panel     Status: Abnormal   Collection Time: 11/04/18  6:11 AM  Result Value Ref Range   Sodium 134 (L) 135 - 145 mmol/L   Potassium 6.0 (H) 3.5 - 5.1 mmol/L    Comment: REPEATED TO VERIFY   Chloride 103 98 - 111 mmol/L    CO2 20 (L) 22 - 32 mmol/L   Glucose, Bld 109 (H) 70 - 99 mg/dL   BUN 20 8 - 23 mg/dL   Creatinine, Ser 1.81 (H) 0.61 - 1.24 mg/dL   Calcium 9.4 8.9 - 10.3 mg/dL   GFR calc non Af Amer 37 (L) >60 mL/min   GFR calc Af Amer 43 (L) >60 mL/min   Anion gap 11 5 - 15    Comment: Performed at [facility], [place] [address], [place], [place] [address]  CBC     Status: Abnormal   Collection Time: 11/04/18  6:11 AM  Result Value Ref Range   WBC 4.2 4.0 - 10.5 K/uL   RBC 5.14 4.22 - 5.81 MIL/uL   Hemoglobin 15.3 13.0 - 17.0 g/dL   HCT 48.7 39.0 - 52.0 %   MCV 94.7 80.0 - 100.0 fL   MCH 29.8 26.0 - 34.0 pg   MCHC 31.4 30.0 - 36.0 g/dL   RDW 16.7 (H) 11.5 - 15.5 %   Platelets 201 150 - 400 K/uL   nRBC 0.0 0.0 - 0.2 %    Comment: Performed at [facility], [place] [address] [place], [place] [address]  TSH     Status: Abnormal   Collection Time: 11/04/18  6:11 AM  Result Value Ref Range   TSH 10.048 (H) 0.350 - 4.500 uIU/mL    Comment: Performed by a 3rd Generation assay with a functional sensitivity of <=0.01 uIU/mL. Performed at [facility] [place] [address] [place], [place] [address]    Dg Chest 2 View  Result Date: 11/05/2018 CLINICAL DATA:  Shortness of breath EXAM: CHEST - 2 VIEW COMPARISON:  September 16, 2015 FINDINGS: There is slight bibasilar atelectasis. Subtle patchy opacity noted in the posterior left base. There is no edema or consolidation. Heart is borderline enlarged with pulmonary vascularity normal. No adenopathy. There is aortic atherosclerosis. There is an old healed fracture of the left clavicle. IMPRESSION:  Mild bibasilar atelectasis. Suspect small focus of pneumonia posterior left base. Lungs elsewhere clear. Mild cardiac enlargement. Aortic Atherosclerosis (ICD10-I70.0). Followup PA and lateral chest radiographs recommended in 3-4 weeks following trial of antibiotic therapy to ensure resolution and exclude underlying  malignancy. Electronically Signed   By: Lowella Grip III M.D.   On: 10/27/2018 19:17   Ct Angio Chest Pe W And/or Wo Contrast  Result Date: 10/23/2018 CLINICAL DATA:  Short of breath, positive D-dimer, history of CHF EXAM: CT ANGIOGRAPHY CHEST WITH CONTRAST TECHNIQUE: Multidetector CT imaging of the chest was performed using the standard protocol during bolus administration of intravenous contrast. Multiplanar CT image reconstructions and MIPs were obtained to evaluate the vascular anatomy. CONTRAST:  41mL OMNIPAQUE IOHEXOL 350 MG/ML SOLN COMPARISON:  Radiograph 11/17/2018 FINDINGS: Cardiovascular: There is suboptimal opacification of the pulmonary arteries likely due to right-sided heart failure with extensive mixing artifact seen within the central pulmonary arteries and poor opacification beyond the lobar levels. No convincing central or lobar arterial filling defect is seen. The aorta is dilated with the ascending aorta measuring up to 4.9 cm. The aorta remains unopacified therefore precluding luminal evaluation. Atheromatous plaque is present throughout the aorta predominantly the descending portions. There is pronounced cardiomegaly with biatrial enlargement. Contrast refluxes into the hepatic veins and into the azygos and hemi azygous. Atherosclerotic calcification of the coronary arteries. There is a small pericardial effusion. Mediastinum/Nodes: There are few low-attenuation, prominent lymph nodes, largest in the left axilla measuring up to 9 mm. No acute abnormality of the trachea or esophagus. Thyroid gland and thoracic inlet is unremarkable aside from distension of the jugular veins. Lungs/Pleura: Extensive respiratory motion artifact limits evaluation of the lung parenchyma. There is a moderate right and small left pleural effusion. A pickle paraseptal emphysematous change/bullous disease is noted. No consolidative process is evident. No pneumothorax. Upper Abdomen: There is a markedly nodular  surface contour of the liver. Contrast refluxes prominently into the hepatic veins. Upper abdominal atherosclerotic calcification of the aorta is noted. There is bilateral perinephric stranding and a small volume fluid in the retroperitoneum. Musculoskeletal: Circumferential body wall edema is noted with a relative paucity of subcutaneous fat. There is mild bilateral gynecomastia. There is a likely chronic compression deformity the T11 vertebral body with approximately 50% height loss centrally. No acute or suspicious osseous lesions are evident. Review of the MIP images confirms the above findings. IMPRESSION: 1. Suboptimal opacification of the pulmonary arteries likely due to right-sided heart failure with extensive mixing artifact seen within the central pulmonary arteries and poor opacification beyond the lobar levels. No convincing large central or lobar arterial filling defect. 2. Marked cardiomegaly with biatrial enlargement. 3. Markedly nodular surface contour of the liver, such finding could be seen with cirrhosis or hepatic congestion. 4. Features of anasarca including circumferential body wall edema, pericardial effusion, moderate right and small left pleural effusion. 5. Probable chronic compression deformity of the T11 vertebral body with approximately 50% height loss centrally. Could correlate with point tenderness if there is concern for acute fracture. 6. Ascending thoracic aortic aneurysm. Recommend semi-annual imaging followup by CTA or MRA and referral to cardiothoracic surgery if not already obtained. This recommendation follows 2010 ACCF/AHA/AATS/ACR/ASA/SCA/SCAI/SIR/STS/SVM Guidelines for the Diagnosis and Management of Patients With Thoracic Aortic Disease. Circulation. 2010; [id number]. Aortic aneurysm NOS (ICD10-I71.9) 7. Aortic atherosclerosis (ICD10-I70.0). Electronically Signed   By: Lovena Le M.D.   On: 11/15/2018 23:52    Pending Labs Unresulted Labs (From admission, onward)  Start     Ordered   11/04/18 0157  HIV4GL Save Tube  (HIV Antibody (Routine testing w reflex) panel)  Once,   STAT     11/04/18 0158          Vitals/Pain Today's Vitals   11/04/18 1000 11/04/18 1015 11/04/18 1019 11/04/18 1059  BP: 120/82   (!) 134/96  Pulse:  (!) 58 60   Resp: (!) 33 (!) 30 16   Temp:      TempSrc:      SpO2:  97% 97%   PainSc:        Isolation Precautions No active isolations  Medications Medications  sodium chloride (PF) 0.9 % injection (has no administration in time range)  nitroGLYCERIN (NITROSTAT) SL tablet 0.4 mg (0.4 mg Sublingual Given 11/04/18 0852)  nitroGLYCERIN (NITROGLYN) 2 % ointment 1 inch (1 inch Topical Given 11/04/18 0254)  allopurinol (ZYLOPRIM) tablet 100 mg (has no administration in time range)  aspirin EC tablet 81 mg (81 mg Oral Given 11/04/18 1059)  hydrALAZINE (APRESOLINE) tablet 25 mg (has no administration in time range)  acetaminophen (TYLENOL) tablet 650 mg (has no administration in time range)    Or  acetaminophen (TYLENOL) suppository 650 mg (has no administration in time range)  ondansetron (ZOFRAN) tablet 4 mg (has no administration in time range)    Or  ondansetron (ZOFRAN) injection 4 mg (has no administration in time range)  furosemide (LASIX) injection 40 mg (40 mg Intravenous Given 11/04/18 0845)  albuterol (PROVENTIL) (2.5 MG/3ML) 0.083% nebulizer solution 2.5 mg (has no administration in time range)  nitroGLYCERIN (NITROGLYN) 2 % ointment (has no administration in time range)  ipratropium-albuterol (DUONEB) 0.5-2.5 (3) MG/3ML nebulizer solution 3 mL (has no administration in time range)  ipratropium-albuterol (DUONEB) 0.5-2.5 (3) MG/3ML nebulizer solution 3 mL (3 mLs Nebulization Given 11/04/18 0900)  metoprolol succinate (TOPROL-XL) 24 hr tablet 100 mg (100 mg Oral Given 11/04/18 1059)  iohexol (OMNIPAQUE) 350 MG/ML injection 100 mL (80 mLs Intravenous Contrast Given 11/19/2018 2311)  furosemide (LASIX) injection  40 mg (40 mg Intravenous Given 11/04/18 0037)    Mobility walks with person assist Low fall risk   Focused Assessments    R Recommendations: See Admitting Provider Note  Report given to:   Additional Notes:

## 2018-11-04 NOTE — ED Notes (Signed)
Pt is alert and oriented x 4 and is verbally responsive. Pt  denies pain at this time, and is having some intermittent SOB, pt had episode of SOB while RN in Room gave pt NTG SL. Pt denies pain at this time.Wife is at bedside. Pt O2 saturation 98% on 2 lpm.

## 2018-11-04 NOTE — Consult Note (Signed)
Cardiology Consultation:   Patient ID: Keith Nguyen.; [id number]; 1950-03-14   Admit date: 11/02/2018 Date of Consult: 11/04/2018  Primary Care Provider: [name], MD Primary Cardiologist: New to [place]; Dr. Harrell Nguyen (previously followed by Dr. Wynonia Nguyen) Primary Electrophysiologist:  None   Patient Profile:   Keith Nguyen. is a 68 y.o. 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 Hal Nguyen.  History of Present Illness:   Keith Nguyen was in his usual state of health until a few weeks ago when he developed progressive SOB. He lives in [place], [place] and has been visiting his daughter in [place] for the past 3 days. His shortness of breath progressed over the past few days, occurring at rest, prompting him to present to the ED for further evaluation.  He previously followed with Dr. Wynonia Nguyen, with his last visit 03/2016 (records not available to review). He was found to have a low EF on preoperative stress test in 2017, as well as evidence of prior infarction. Echo showed EF 40% at that time. He underwent a LHC in 2017 which showed mild non-obstructive CAD and EF 35-40%. He was recommended for medical management. He has not had any cardiovascular testing since that time.   At the time of this evaluation he remains SOB when he moves around but reports breathing is stable when he lays still. He reports a poor appetite over the past few weeks and thinks he's lost 30lbs in that time frame. He denies significant LE edema, abdominal distention, orthopnea (sleeps on 2 pillows at baseline), or PND. He has been limited in activity by SOB but denies any chest pain at rest or with exertion. He reports occasional palpitations which occur a few times a day and last for a couple minutes at a time;  describes them as a heart racing sensation. He has been compliant with his medication. He denies fever, night sweats, dizziness, lightheadedness, abdominal pain, nausea, vomiting, diarrhea, hematochezia, melena, hematuria, or difficulty urinating. He reports last colonoscopy and prostate screening were 3-5 years ago and reportedly normal.   Hospital course: BP with elevated DBP, bradycardic to the high 50s, intermittently tachypneic, O2 stable on [place], afebrile. Labs notable for Na 134, K 4.8>6.0, Cr 1.29>1.81, CBC wnl, Trop 65>60, BNP >4500, TSH 10, Ddimer 3.54, COVID 19 pending. EKG with sinus rhythm with 1st degree AV block, IVCD, LAD, LVH with early repol, TWI in V5-6, no STE/D, no significant change from previous.  CXR with possible posterior left base PNA. CTA chest without obvious PE though suboptimal opacification noted; cardiomegaly, cirrhosis vs hepatic congestion, anasarca (circumferential body wall edema, pericardial effusion, moderate right and small left pleural effusion), ascending thoracic aortic aneurysm (4.9 cm) recommended for semi-annual monitoring and referral to CT surgery. He was given IV lasix 40mg  in the ED and admitted to medicine. Lasix 40mg  BID IV continued. I&Os and weight not documented.   Past Medical History:  Diagnosis Date   Abnormal cardiac function test 09/16/2015   Aortic atherosclerosis ([place]) 09/16/2015   History of hepatitis C 09/16/2015   Prior treatment with Harvoni    Hyperlipidemia 09/16/2015   Hypertensive heart disease without CHF 09/16/2015    Past Surgical History:  Procedure Laterality Date   CARDIAC CATHETERIZATION N/A 09/22/2015   Procedure: Left Heart Cath and  Coronary Angiography;  Surgeon: Keith Sine, MD;  Location: [facility];  Service: Cardiovascular;  Laterality: N/A;   TONSILLECTOMY       Home Medications:  Prior to Admission medications   Medication Sig Start Date End Date Taking? Authorizing Provider  albuterol (PROVENTIL  HFA;VENTOLIN HFA) 108 (90 Base) MCG/ACT inhaler Inhale 1-2 puffs into the lungs every 6 (six) hours as needed for wheezing or shortness of breath. 03/24/18  Yes Nguyen, Keith C, PA-C  allopurinol (ZYLOPRIM) 100 MG tablet Take 100 mg by mouth daily.   Yes [provider]  aspirin 81 MG tablet Take 81 mg by mouth daily.   Yes [provider]  furosemide (LASIX) 40 MG tablet Take 40 mg by mouth daily.   Yes [provider]  hydrALAZINE (APRESOLINE) 25 MG tablet Take 25 mg by mouth 2 (two) times daily.   Yes [provider]  losartan (COZAAR) 100 MG tablet Take 100 mg by mouth daily.   Yes [provider]  metoprolol succinate (TOPROL-XL) 100 MG 24 hr tablet Take 200 mg by mouth daily. Take with or immediately following a meal.   Yes [provider]    Inpatient Medications: Scheduled Meds:  allopurinol  100 mg Oral Daily   aspirin EC  81 mg Oral Daily   furosemide  40 mg Intravenous Q12H   hydrALAZINE  25 mg Oral BID   ipratropium-albuterol  3 mL Nebulization TID   metoprolol succinate  200 mg Oral Daily   nitroGLYCERIN  1 inch Topical Q6H   nitroGLYCERIN       sodium chloride (PF)       Continuous Infusions:  PRN Meds: acetaminophen **OR** acetaminophen, albuterol, ipratropium-albuterol, nitroGLYCERIN, ondansetron **OR** ondansetron (ZOFRAN) IV  Allergies:    Allergies  Allergen Reactions   Lipitor [Atorvastatin]     Elevated liver enzymes    Social History:   Social History   Socioeconomic History   Marital status: Married    Spouse name: Not on file   Number of children: Not on file   Years of education: Not on file   Highest education level: Not on file  Occupational History   Not on file  Social Needs   Financial resource strain: Not on file   Food insecurity    Worry: Not on file    Inability: Not on file   Transportation needs    Medical: Not on file    Non-medical: Not on file  Tobacco Use    Smoking status: Current Every Day Smoker   Smokeless tobacco: Never Used  Substance and Sexual Activity   Alcohol use: Yes    Alcohol/week: 4.0 - 6.0 standard drinks    Types: 4 - 6 Shots of liquor per week    Comment: Pt drinks beer and liquor daily   Drug use: No   Sexual activity: Not on file  Lifestyle   Physical activity    Days per week: Not on file    Minutes per session: Not on file   Stress: Not on file  Relationships   Social connections    Talks on phone: Not on file    Gets together: Not on file    Attends religious service: Not on file    Active member of club or organization: Not on file    Attends meetings of clubs or organizations: Not on file    Relationship status: Not on file   Intimate partner violence    Fear  of current or ex partner: Not on file    Emotionally abused: Not on file    Physically abused: Not on file    Forced sexual activity: Not on file  Other Topics Concern   Not on file  Social History Narrative   Not on file    Family History:    Family History  Problem Relation Age of Onset   Stroke Mother    Lung cancer Father    Hypertension Sister      ROS:  Please see the history of present illness.   All other ROS reviewed and negative.     Physical Exam/Data:   Vitals:   11/04/18 0300 11/04/18 0400 11/04/18 0530 11/04/18 0630  BP: (!) 126/104 (!) 129/104 (!) 128/107 (!) 122/100  Pulse: (!) 57  (!) 58 (!) 59  Resp: 15 (!) 36 20 (!) 36  Temp:      TempSrc:      SpO2: 100%  99% 96%   No intake or output data in the 24 hours ending 11/04/18 0743 There were no vitals filed for this visit. There is no height or weight on file to calculate BMI.  General:  Well nourished, well developed, laying in bed in no acute distress HEENT: sclera anicteric  Neck: +JVD Vascular: No carotid bruits; distal pulses 2+ bilaterally Cardiac:  normal S1, S2; RRR; no murmurs, rubs, or gallops Lungs:  Decreased breath sounds at lung  bases but no overt wheezing/rales/rhonchi Abd: NABS, soft, nontender, no hepatomegaly Ext: no edema Musculoskeletal:  No deformities, BUE and BLE strength normal and equal Skin: warm and dry  Neuro:  CNs 2-12 intact, no focal abnormalities noted Psych:  Normal affect   EKG:  The EKG was personally reviewed and demonstrates:  sinus rhythm with 1st degree AV block, IVCD, LAD, LVH with early repol, TWI in V5-6, no STE/D, no significant change from previous.  Telemetry:  Telemetry was personally reviewed and demonstrates:  Sinus rhythm with 1st degree AV block with sinus bradycardia overnight (as low as 37 bpm)  Relevant CV Studies:  Left heart catheterization 08/2015:  There is moderate left ventricular systolic dysfunction.  LV end diastolic pressure is normal.  Ost Ramus to Ramus lesion, 20 %stenosed.  Prox RCA lesion, 10 %stenosed.   Moderate global LV dysfunction with diffuse hypocontractility that is slightly more pronounced in the mid inferior and mid anterolateral wall on the RAO projection with an ejection fraction of 35 to less than 40%.  No significant coronary obstructive disease with a normal left main, normal LAD, mild 20% smooth proximal ramus intermediate stenosis, large dominant normal left circumflex artery, and a small nondominant RCA with mild 10% proximal narrowing.  RECOMMENDATION: The angiographic findings were reviewed with Dr. Wynonia Nguyen, the patient's primary cardiologist.  He will see the patient in one week in follow-up of this catheterization.  The patient will continue with current medical therapy and adjustments will be made per Dr. Wynonia Nguyen.    Laboratory Data:  Chemistry Recent Labs  Lab 11/18/2018 2018 11/04/18 0611  NA 136 134*  K 4.8 6.0*  CL 103 103  CO2 21* 20*  GLUCOSE 99 109*  BUN 18 20  CREATININE 1.29* 1.81*  CALCIUM 9.5 9.4  GFRNONAA 56* 37*  GFRAA >60 43*  ANIONGAP 12 11    Recent Labs  Lab 11/04/18 0611  PROT 8.5*  ALBUMIN 3.8    AST 46*  ALT 15  ALKPHOS 67  BILITOT 1.8*   Hematology Recent Labs  Lab 11/15/2018 2018 11/04/18 0611  WBC 4.2 4.2  RBC 5.14 5.14  HGB 15.2 15.3  HCT 49.2 48.7  MCV 95.7 94.7  MCH 29.6 29.8  MCHC 30.9 31.4  RDW 16.9* 16.7*  PLT 180 201   Cardiac EnzymesNo results for input(s): TROPONINI in the last 168 hours. No results for input(s): TROPIPOC in the last 168 hours.  BNP Recent Labs  Lab 11/01/2018 2019  BNP >4,500.0*    DDimer  Recent Labs  Lab 11/09/2018 2018  DDIMER 3.54*    Radiology/Studies:  Dg Chest 2 View  Result Date: 10/25/2018 CLINICAL DATA:  Shortness of breath EXAM: CHEST - 2 VIEW COMPARISON:  September 16, 2015 FINDINGS: There is slight bibasilar atelectasis. Subtle patchy opacity noted in the posterior left base. There is no edema or consolidation. Heart is borderline enlarged with pulmonary vascularity normal. No adenopathy. There is aortic atherosclerosis. There is an old healed fracture of the left clavicle. IMPRESSION: Mild bibasilar atelectasis. Suspect small focus of pneumonia posterior left base. Lungs elsewhere clear. Mild cardiac enlargement. Aortic Atherosclerosis (ICD10-I70.0). Followup PA and lateral chest radiographs recommended in 3-4 weeks following trial of antibiotic therapy to ensure resolution and exclude underlying malignancy. Electronically Signed   By: Lowella Grip III M.D.   On: 11/05/2018 19:17   Ct Angio Chest Pe W And/or Wo Contrast  Result Date: 11/11/2018 CLINICAL DATA:  Short of breath, positive D-dimer, history of CHF EXAM: CT ANGIOGRAPHY CHEST WITH CONTRAST TECHNIQUE: Multidetector CT imaging of the chest was performed using the standard protocol during bolus administration of intravenous contrast. Multiplanar CT image reconstructions and MIPs were obtained to evaluate the vascular anatomy. CONTRAST:  45mL OMNIPAQUE IOHEXOL 350 MG/ML SOLN COMPARISON:  Radiograph 10/25/2018 FINDINGS: Cardiovascular: There is suboptimal opacification  of the pulmonary arteries likely due to right-sided heart failure with extensive mixing artifact seen within the central pulmonary arteries and poor opacification beyond the lobar levels. No convincing central or lobar arterial filling defect is seen. The aorta is dilated with the ascending aorta measuring up to 4.9 cm. The aorta remains unopacified therefore precluding luminal evaluation. Atheromatous plaque is present throughout the aorta predominantly the descending portions. There is pronounced cardiomegaly with biatrial enlargement. Contrast refluxes into the hepatic veins and into the azygos and hemi azygous. Atherosclerotic calcification of the coronary arteries. There is a small pericardial effusion. Mediastinum/Nodes: There are few low-attenuation, prominent lymph nodes, largest in the left axilla measuring up to 9 mm. No acute abnormality of the trachea or esophagus. Thyroid gland and thoracic inlet is unremarkable aside from distension of the jugular veins. Lungs/Pleura: Extensive respiratory motion artifact limits evaluation of the lung parenchyma. There is a moderate right and small left pleural effusion. A pickle paraseptal emphysematous change/bullous disease is noted. No consolidative process is evident. No pneumothorax. Upper Abdomen: There is a markedly nodular surface contour of the liver. Contrast refluxes prominently into the hepatic veins. Upper abdominal atherosclerotic calcification of the aorta is noted. There is bilateral perinephric stranding and a small volume fluid in the retroperitoneum. Musculoskeletal: Circumferential body wall edema is noted with a relative paucity of subcutaneous fat. There is mild bilateral gynecomastia. There is a likely chronic compression deformity the T11 vertebral body with approximately 50% height loss centrally. No acute or suspicious osseous lesions are evident. Review of the MIP images confirms the above findings. IMPRESSION: 1. Suboptimal opacification of  the pulmonary arteries likely due to right-sided heart failure with extensive mixing artifact seen within the central pulmonary arteries and  poor opacification beyond the lobar levels. No convincing large central or lobar arterial filling defect. 2. Marked cardiomegaly with biatrial enlargement. 3. Markedly nodular surface contour of the liver, such finding could be seen with cirrhosis or hepatic congestion. 4. Features of anasarca including circumferential body wall edema, pericardial effusion, moderate right and small left pleural effusion. 5. Probable chronic compression deformity of the T11 vertebral body with approximately 50% height loss centrally. Could correlate with point tenderness if there is concern for acute fracture. 6. Ascending thoracic aortic aneurysm. Recommend semi-annual imaging followup by CTA or MRA and referral to cardiothoracic surgery if not already obtained. This recommendation follows 2010 ACCF/AHA/AATS/ACR/ASA/SCA/SCAI/SIR/STS/SVM Guidelines for the Diagnosis and Management of Patients With Thoracic Aortic Disease. Circulation. 2010; [id number]. Aortic aneurysm NOS (ICD10-I71.9) 7. Aortic atherosclerosis (ICD10-I70.0). Electronically Signed   By: Lovena Le M.D.   On: 10/23/2018 23:52    Assessment and Plan:   1. Acute on chronic combined CHF: patient presented with progressive SOB. CTA Chest with anasarca, bilateral pleural effusions R>L, pericardial effusion, and hepatic congestion. EF 35-40% on LHC in 2017 (non-ischemic). Started on IV lasix 40mg  BID. No UOP or weights documented. [place] cath in place with 200cc of urine in foley bag. Unfortunately Cr has jumped from 1.29>1.81. He continues to have significant SOB with minimal activity and decreased breath sounds at lung bases, though no significant edema on exam.  - Will f/u echocardiogram  - Continue diuresis with IV lasix at current dose - Continue metoprolol - will reduce dose to 100mg  daily given significant  bradycardia overnight and 1st degree AV block at baseline.  - Hold losartan given rising Cr.   2. HTN: DBP persistently elevated. Home losartan on hold given AoCKD.  - Continue hydralazine and metoprolol (reduced dose) - Could consider increasing hydralazine if BP remains poorly controlled after diuresis  3. CAD: mild non-obstructive disease on LHC in 2017. No anginal complaints. Troponins mildly elevated. Suspect this is 2/2 demand ischemia - No ischemic evaluation at this time - Continue aspirin  4. Pericardial effusion: noted on CTA Chest. No evidence of tamponade clinically - Will follow-up echocardiogram findings - Continue diuresis as above  5. HLD: no recent lipid panel. Reported to have elevated LFTs with atorvastatin. LFTs mildly elevated this admission - Could consider referral to lipid clinic at discharge  6. Ascending thoracic aneurysm: 4.9cm on CTA Chest this admission. Recommended for semi-annual monitoring - Consider referral to TCTS at discharge for ongoing management/monitoring - Continue aggressive BP management as above  7. Acute on chronic renal insufficiency stage 3: Cr 1.29 on admission (appears to be baseline); bumped to 1.8 on labs today with IV diuresis.  - Continue to monitor closely  8. Tobacco abuse: reports quitting 3 weeks ago. Congratulated him on this achievement.  - Continue to encourage smoking cessation  9. Weight loss: patient reports 30lb weight loss in the past few weeks. ?underlying malignancy - Will defer work-up to primary team   For questions or updates, please contact [place] Please consult [email or web address] for contact info under Cardiology/STEMI.   Signed, Abigail Butts, PA-C  11/04/2018 7:43 AM [phone]

## 2018-11-04 NOTE — Progress Notes (Addendum)
Patient was seen and examined.  He was waiting in the emergency room to go upstairs to have inpatient bed availability.  Admitted with worsening shortness of breath with evidence of systolic CHF exacerbation.  Plan: Echocardiogram pending.  Continue IV Lasix today. Potassium is 6, creatinine is 1.8. We will recheck potassium to ensure stabilization. Cardiology to follow.  Due to significant symptoms, patient will need inpatient hospitalization and management including monitoring of electrolytes, monitoring and telemetry units, intravenous diuretics.  Addendum:  Recheck Creat 1.8 and potassium 5.9. EF is 20% Will start him on continue diuresis due to worsening renal functions and significant anasarca. Start lasix 4 mg/hour. Out put monitoring.

## 2018-11-05 ENCOUNTER — Ambulatory Visit: Payer: 59 | Admitting: Cardiology

## 2018-11-05 ENCOUNTER — Inpatient Hospital Stay (HOSPITAL_COMMUNITY): Payer: 59

## 2018-11-05 ENCOUNTER — Ambulatory Visit: Payer: 59 | Admitting: Family Medicine

## 2018-11-05 ENCOUNTER — Inpatient Hospital Stay: Payer: Self-pay

## 2018-11-05 DIAGNOSIS — N183 Chronic kidney disease, stage 3 unspecified: Secondary | ICD-10-CM

## 2018-11-05 DIAGNOSIS — I5023 Acute on chronic systolic (congestive) heart failure: Secondary | ICD-10-CM | POA: Diagnosis not present

## 2018-11-05 DIAGNOSIS — N179 Acute kidney failure, unspecified: Secondary | ICD-10-CM | POA: Diagnosis not present

## 2018-11-05 DIAGNOSIS — N189 Chronic kidney disease, unspecified: Secondary | ICD-10-CM | POA: Diagnosis not present

## 2018-11-05 DIAGNOSIS — I509 Heart failure, unspecified: Secondary | ICD-10-CM

## 2018-11-05 DIAGNOSIS — I5021 Acute systolic (congestive) heart failure: Secondary | ICD-10-CM | POA: Diagnosis not present

## 2018-11-05 LAB — BASIC METABOLIC PANEL
Anion gap: 15 (ref 5–15)
BUN: 32 mg/dL — ABNORMAL HIGH (ref 8–23)
CO2: 18 mmol/L — ABNORMAL LOW (ref 22–32)
Calcium: 9.7 mg/dL (ref 8.9–10.3)
Chloride: 104 mmol/L (ref 98–111)
Creatinine, Ser: 2.1 mg/dL — ABNORMAL HIGH (ref 0.61–1.24)
GFR calc Af Amer: 36 mL/min — ABNORMAL LOW (ref >60)
GFR calc non Af Amer: 31 mL/min — ABNORMAL LOW (ref >60)
Glucose, Bld: 91 mg/dL (ref 70–99)
Potassium: 5.4 mmol/L — ABNORMAL HIGH (ref 3.5–5.1)
Sodium: 137 mmol/L (ref 135–145)

## 2018-11-05 LAB — CBC WITH DIFFERENTIAL/PLATELET
Abs Immature Granulocytes: 0.02 10*3/uL (ref 0.00–0.07)
Basophils Absolute: 0 10*3/uL (ref 0.0–0.1)
Basophils Relative: 1 %
Eosinophils Absolute: 0 10*3/uL (ref 0.0–0.5)
Eosinophils Relative: 0 %
HCT: 47.4 % (ref 39.0–52.0)
Hemoglobin: 15 g/dL (ref 13.0–17.0)
Immature Granulocytes: 0 %
Lymphocytes Relative: 27 %
Lymphs Abs: 1.4 10*3/uL (ref 0.7–4.0)
MCH: 29.4 pg (ref 26.0–34.0)
MCHC: 31.6 g/dL (ref 30.0–36.0)
MCV: 92.9 fL (ref 80.0–100.0)
Monocytes Absolute: 0.5 10*3/uL (ref 0.1–1.0)
Monocytes Relative: 10 %
Neutro Abs: 3.1 10*3/uL (ref 1.7–7.7)
Neutrophils Relative %: 62 %
Platelets: 208 10*3/uL (ref 150–400)
RBC: 5.1 MIL/uL (ref 4.22–5.81)
RDW: 16.8 % — ABNORMAL HIGH (ref 11.5–15.5)
WBC: 5.1 10*3/uL (ref 4.0–10.5)
nRBC: 0 % (ref 0.0–0.2)

## 2018-11-05 LAB — COOXEMETRY PANEL
Carboxyhemoglobin: 0.4 % — ABNORMAL LOW (ref 0.5–1.5)
Carboxyhemoglobin: 0.5 % (ref 0.5–1.5)
Methemoglobin: 0.8 % (ref 0.0–1.5)
Methemoglobin: 0.6 % (ref 0.0–1.5)
O2 Saturation: 40.5 %
O2 Saturation: 44.9 %
Total hemoglobin: 14.7 g/dL (ref 12.0–16.0)
Total hemoglobin: 14.3 g/dL (ref 12.0–16.0)

## 2018-11-05 LAB — GLUCOSE, CAPILLARY: Glucose-Capillary: 123 mg/dL — ABNORMAL HIGH (ref 70–99)

## 2018-11-05 LAB — MRSA PCR SCREENING: MRSA by PCR: NEGATIVE

## 2018-11-05 MED ORDER — SODIUM CHLORIDE 0.9% FLUSH
3.0000 mL | Freq: Two times a day (BID) | INTRAVENOUS | Status: DC
  Administered 2018-11-05 – 2018-11-13 (×10): 3 mL via INTRAVENOUS

## 2018-11-05 MED ORDER — METOPROLOL SUCCINATE ER 25 MG PO TB24
25.0000 mg | ORAL_TABLET | Freq: Every day | ORAL | Status: DC
  Administered 2018-11-06 – 2018-11-16 (×10): 25 mg via ORAL
  Filled 2018-11-05 (×10): qty 1

## 2018-11-05 MED ORDER — SODIUM CHLORIDE 0.9% FLUSH
10.0000 mL | Freq: Two times a day (BID) | INTRAVENOUS | Status: DC
  Administered 2018-11-05 – 2018-11-08 (×5): 10 mL
  Administered 2018-11-09: 22:00:00 20 mL
  Administered 2018-11-09: 10 mL
  Administered 2018-11-10: 20 mL
  Administered 2018-11-10 – 2018-11-18 (×11): 10 mL
  Administered 2018-11-20: 20 mL

## 2018-11-05 MED ORDER — ISOSORB DINITRATE-HYDRALAZINE 20-37.5 MG PO TABS
0.5000 | ORAL_TABLET | Freq: Three times a day (TID) | ORAL | Status: DC
  Administered 2018-11-05 (×2): 0.5 via ORAL
  Filled 2018-11-05 (×4): qty 0.5

## 2018-11-05 MED ORDER — ALBUTEROL SULFATE HFA 108 (90 BASE) MCG/ACT IN AERS
2.0000 | INHALATION_SPRAY | Freq: Four times a day (QID) | RESPIRATORY_TRACT | Status: DC | PRN
  Filled 2018-11-05: qty 6.7

## 2018-11-05 MED ORDER — MILRINONE LACTATE IN DEXTROSE 20-5 MG/100ML-% IV SOLN
0.3750 ug/kg/min | INTRAVENOUS | Status: DC
  Administered 2018-11-05: 17:00:00 0.25 ug/kg/min via INTRAVENOUS
  Administered 2018-11-06 – 2018-11-07 (×3): 0.375 ug/kg/min via INTRAVENOUS
  Filled 2018-11-05 (×4): qty 100

## 2018-11-05 MED ORDER — METOLAZONE 2.5 MG PO TABS
2.5000 mg | ORAL_TABLET | Freq: Once | ORAL | Status: AC
  Administered 2018-11-05: 22:00:00 2.5 mg via ORAL
  Filled 2018-11-05: qty 1

## 2018-11-05 MED ORDER — PERFLUTREN LIPID MICROSPHERE
1.0000 mL | INTRAVENOUS | Status: AC | PRN
  Administered 2018-11-05: 3 mL via INTRAVENOUS
  Filled 2018-11-05: qty 10

## 2018-11-05 MED ORDER — SODIUM CHLORIDE 0.9% FLUSH: 10.0000 mL | INTRAVENOUS | Status: DC | PRN

## 2018-11-05 MED ORDER — ORAL CARE MOUTH RINSE
15.0000 mL | Freq: Two times a day (BID) | OROMUCOSAL | Status: DC
  Administered 2018-11-05 – 2018-11-16 (×17): 15 mL via OROMUCOSAL

## 2018-11-05 MED ORDER — CHLORHEXIDINE GLUCONATE CLOTH 2 % EX PADS
6.0000 | MEDICATED_PAD | Freq: Every day | CUTANEOUS | Status: DC
  Administered 2018-11-05 – 2018-11-23 (×13): 6 via TOPICAL

## 2018-11-05 NOTE — Progress Notes (Signed)
  Echocardiogram 2D Echocardiogram has been performed.  Burnett Kanaris 11/05/2018, 1:31 PM

## 2018-11-05 NOTE — Plan of Care (Signed)
  Problem: Education: Goal: Knowledge of General Education information will improve Description Including pain rating scale, medication(s)/side effects and non-pharmacologic comfort measures Outcome: Progressing   

## 2018-11-05 NOTE — H&P (View-Only) (Signed)
Advanced Heart Failure Team Consult Note   Primary Physician: [name], MD PCP-Cardiologist:  No primary care provider on file.  Reason for Consultation: CHF  HPI:    Keith Nguyen. is seen today for evaluation of CHF at the request of Dr. Harrell Gave.   Mr Spiker has a history of chronic systolic CHF, HTN, CKD stage 3, HCV/cirrhosis, smoking/COPD.  Patient had an echo in 2017 with EF 40%.  He then had LHC showing mild nonobstructive CAD and EF by LV-gram 35-40%.  He was seen by Dr. Wynonia Lawman at that time in 2017 but has not been seen by a cardiologist since then.  He has also been found to have cirrhosis, probably due to HCV which has been treated with Harvoni.  He was a smoker but quit in 9/20.  He has history of COPD listed but no PFTs.  He drinks 1 pint liquor per week, quit 1 month ago.  He has drunk about this much for a long time, denies heavier ETOH.  He has a long history of HTN.  Per patient, his father and 2 uncles had CHF, but for at least 1 of these it was related to MI/coronary disease.    He reports weight loss over the last couple of months.  He has not had an appetite.  No evidence for malignancy has been found.  He has been short of breath with most exertion for the last 2 weeks.  He has been orthopneic and has had trouble sleeping.  No chest pain.  He came to the ER due to dyspnea and was admitted.  He had an elevated D dimer and had CTA chest to rule out PE, there was no PE found.  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  He was started on IV Lasix, currently on Lasix gtt 4 mg/hr.  He has not diuresed much and creatinine has risen, now up to 2.1 from 1.29 at admission.  He has also been hyperkalemic. Losartan was stopped.  Toprol XL has been cut back to 100 mg daily.  He has had diastolic hypertension.   Review of Systems: All systems reviewed and negative except as per HPI.   Home Medications Prior to  Admission medications   Medication Sig Start Date End Date Taking? Authorizing Provider  albuterol (PROVENTIL HFA;VENTOLIN HFA) 108 (90 Base) MCG/ACT inhaler Inhale 1-2 puffs into the lungs every 6 (six) hours as needed for wheezing or shortness of breath. 03/24/18  Yes Wieters, Hallie C, PA-C  allopurinol (ZYLOPRIM) 100 MG tablet Take 100 mg by mouth daily.   Yes [provider]  aspirin 81 MG tablet Take 81 mg by mouth daily.   Yes [provider]  furosemide (LASIX) 40 MG tablet Take 40 mg by mouth daily.   Yes [provider]  hydrALAZINE (APRESOLINE) 25 MG tablet Take 25 mg by mouth 2 (two) times daily.   Yes [provider]  losartan (COZAAR) 100 MG tablet Take 100 mg by mouth daily.   Yes [provider]  metoprolol succinate (TOPROL-XL) 100 MG 24 hr tablet Take 200 mg by mouth daily. Take with or immediately following a meal.   Yes [provider]    Past Medical History: 1. Cirrhosis: Possibly due to HCV.  Also has history of ETOH but not heavy per his report.  2. HCV: Treated with Harvoni. 3. Hyperlipidemia 4. HTN 5. COPD: Prior smoker, quit 9/20.  6. CKD: stage 3.  7. Ascending aortic aneurysm: 4.9 cm on 10/20 CTA.  8. Chronic systolic CHF: Nonischemic cardiomyopathy.  - Echo in 2017 with EF 40%.  - LHC (2017) showed nonobstructive mild CAD.  - Echo (10/20): EF 20-25%, mild LVH, prominent apical trabeculations, mild RV dilation with moderately decreased systolic function, mild AI, mild-moderate MR.   Past Surgical History: Past Surgical History:  Procedure Laterality Date  . CARDIAC CATHETERIZATION N/A 09/22/2015   Procedure: Left Heart Cath and Coronary Angiography;  Surgeon: Troy Sine, MD;  Location: [facility];  Service: Cardiovascular;  Laterality: N/A;  . TONSILLECTOMY      Family History: Family History  Problem Relation Age of Onset  . Stroke Mother   . Lung cancer Father   . Hypertension Sister      Social History: Social History   Socioeconomic History  . Marital status: Married    Spouse name: Not on file  . Number of children: Not on file  . Years of education: Not on file  . Highest education level: Not on file  Occupational History  . Not on file  Social Needs  . Financial resource strain: Not on file  . Food insecurity    Worry: Not on file    Inability: Not on file  . Transportation needs    Medical: Not on file    Non-medical: Not on file  Tobacco Use  . Smoking status: Current Every Day Smoker  . Smokeless tobacco: Never Used  Substance and Sexual Activity  . Alcohol use: Yes    Alcohol/week: 4.0 - 6.0 standard drinks    Types: 4 - 6 Shots of liquor per week    Comment: Pt drinks beer and liquor daily  . Drug use: No  . Sexual activity: Not on file  Lifestyle  . Physical activity    Days per week: Not on file    Minutes per session: Not on file  . Stress: Not on file  Relationships  . Social Herbalist on phone: Not on file    Gets together: Not on file    Attends religious service: Not on file    Active member of club or organization: Not on file    Attends meetings of clubs or organizations: Not on file    Relationship status: Not on file  Other Topics Concern  . Not on file  Social History Narrative  . Not on file    Allergies:  Allergies  Allergen Reactions  . Lipitor [Atorvastatin]     Elevated liver enzymes    Objective:    Vital Signs:   Temp:  [97.5 F (36.4 C)-98.7 F (37.1 C)] 97.7 F (36.5 C) (10/14 0504) Pulse Rate:  [57-64] 59 (10/14 0504) Resp:  [18-20] 18 (10/14 0504) BP: (103-122)/(75-98) 122/93 (10/14 0504) SpO2:  [91 %-100 %] 100 % (10/14 0504) Weight:  [74.8 kg] 74.8 kg (10/14 0700) Last BM Date: 11/04/18  Weight change: Filed Weights   11/04/18 1234 11/04/18 1852 11/05/18 0700  Weight: 74.8 kg 74.8 kg 74.8 kg    Intake/Output:   Intake/Output Summary (Last 24 hours) at 11/05/2018 1131 Last  data filed at 11/05/2018 0839 Gross per 24 hour  Intake 279.21 ml  Output 500 ml  Net -220.79 ml      Physical Exam    General:  Well appearing. No resp difficulty HEENT: normal Neck: supple. JVP 16+ cm. Carotids 2+ bilat; no bruits. No lymphadenopathy or  thyromegaly appreciated. Cor: PMI latera. Regular rate & rhythm. 1/6 HSM apex.  No definite S3 or S4 heard. Lungs: Decreased BS at bases.  Abdomen: soft, nontender, mildly distended. No hepatosplenomegaly. No bruits or masses. Good bowel sounds. Extremities: no cyanosis, clubbing, rash, edema Neuro: alert & orientedx3, cranial nerves grossly intact. moves all 4 extremities w/o difficulty. Affect pleasant   Telemetry   NSR in 60s currently, drops to 40s-50s at times (personally reviewed).   EKG    NSR, LVH with repolarization, anterior Qs may be due to LVH (personally reviewed).   Labs   Basic Metabolic Panel: Recent Labs  Lab 10/27/2018 2018 11/04/18 0611 11/04/18 1455 11/05/18 0448  NA 136 134* 137 137  K 4.8 6.0* 5.9* 5.4*  CL 103 103 104 104  CO2 21* 20* 16* 18*  GLUCOSE 99 109* 61* 91  BUN 18 20 23  32*  CREATININE 1.29* 1.81* 1.81* 2.10*  CALCIUM 9.5 9.4 9.4 9.7    Liver Function Tests: Recent Labs  Lab 11/04/18 0611  AST 46*  ALT 15  ALKPHOS 67  BILITOT 1.8*  PROT 8.5*  ALBUMIN 3.8   No results for input(s): LIPASE, AMYLASE in the last 168 hours. No results for input(s): AMMONIA in the last 168 hours.  CBC: Recent Labs  Lab 11/10/2018 2018 11/04/18 0611 11/05/18 0448  WBC 4.2 4.2 5.1  NEUTROABS  --   --  3.1  HGB 15.2 15.3 15.0  HCT 49.2 48.7 47.4  MCV 95.7 94.7 92.9  PLT 180 201 208    Cardiac Enzymes: No results for input(s): CKTOTAL, CKMB, CKMBINDEX, TROPONINI in the last 168 hours.  BNP: BNP (last 3 results) Recent Labs    11/01/2018 2019  BNP >4,500.0*    ProBNP (last 3 results) No results for input(s): PROBNP in the last 8760 hours.   CBG: No results for input(s): GLUCAP  in the last 168 hours.  Coagulation Studies: No results for input(s): LABPROT, INR in the last 72 hours.   Imaging   [place] Ekg Site Rite  Result Date: 11/05/2018 If Site Rite image not attached, placement could not be confirmed due to current cardiac rhythm.     Medications:     Current Medications: . allopurinol  100 mg Oral Daily  . aspirin EC  81 mg Oral Daily  . isosorbide-hydrALAZINE  0.5 tablet Oral TID  . [START ON 11/17/2018] metoprolol succinate  25 mg Oral Daily  . sodium chloride flush  3 mL Intravenous Q12H     Infusions: . furosemide (LASIX) infusion 4 mg/hr (11/04/18 1911)  . milrinone         Assessment/Plan   1. Acute on chronic systolic CHF: Nonischemic cardiomyopathy diagnosed in 2017 with echo showing EF 40% and LHC showing mild nonobstructive CAD.  He saw Dr. Wynonia Lawman in the past, but no cardiology evaluation since 2017.  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  He has drunk moderate ETOH in the past (no longer drinks) but does not seem to have drunk enough to cause a cardiomyopathy.  He is markedly volume overloaded on exam with biventricular failure.  With rise in creatinine and poor diuresis, I am concerned for low output HF.   - Place PICC line now, will follow CVP and co-ox.   - Send co-ox off PICC then will start him on milrinone 0.25 mcg/kg/min for  now.  - After starting milrinone, increase Lasix gtt to 12 mg/hr and follow response.  - Stop hydralazine and NTG paste, will start Bidil 1/2 tab tid.  - Decrease Toprol XL to 25 mg daily with volume overload and concern for low output (also with low HR at times).  - RHC tomorrow. With fall in EF, would consider eventual coronary angiography but not right now with creatinine rise. Discussed risks/benefits with patient, he agrees to procedure.  - Will  repeat limited echo with contrast given extensive LV trabeculations to fully rule out thrombus.  2. AKI on CKD stage 3: Suspect this may be a combination of cardiorenal syndrome and contrast nephropathy (had CTA chest at admission).   - Avoid further contrast.  - He is significantly volume overloaded, so will push diuresis. Will support cardiac output with milrinone.  3. Cirrhosis: Suspect due to HCV, this has been treated with Harvoni.  He was drinking moderate ETOH as well but has quit.  4. COPD/smoking: He recently quit smoking.    We will transfer over to [facility] to manage on HF service.   Length of Stay: 1  Loralie Champagne, MD  11/05/2018, 11:31 AM  Advanced Heart Failure Team Pager [phone] (M-F; 7a - 4p)  Please contact [facility] for night-coverage after hours (4p -7a ) and weekends on [email or web address]

## 2018-11-05 NOTE — Progress Notes (Signed)
Pt admitted for Heart Failure. Shortness of breath with activity. Pt recovers slowly. Pt on O2 @ 2 liters. Currently has Lasix drip infusing, pt plans to increase and drip and add Milirone. Pt to transfer to ICU/SD until bed become available at [facility] hospital. Bed request made to admission to 2 C per MF request. Daughter at bedside and aware of pt status change. Will cont to monitor. SRP, RN

## 2018-11-05 NOTE — TOC Initial Note (Signed)
Transition of Care (TOC) - Initial/Assessment Note    Patient Details  Name: Keith Nguyen. MRN: [id number] Date of Birth: 1950/09/27  Transition of Care [facility]) CM/SW Contact:    Purcell Mouton, RN Phone Number: 11/05/2018, 11:58 AM  Clinical Narrative:                 Pt transferring to [facility]. Admitted with Acute on chronic systolic CHF.  Pt lives with his wife.   Expected Discharge Plan: [place]     Patient Goals and CMS Choice Patient states their goals for this hospitalization and ongoing recovery are:: To get better and go Home.      Expected Discharge Plan and Services Expected Discharge Plan: [place]   Discharge Planning Services: CM Consult   Living arrangements for the past 2 months: Single Family Home                                      Prior Living Arrangements/Services Living arrangements for the past 2 months: Single Family Home Lives with:: Spouse Patient language and need for interpreter reviewed:: No Do you feel safe going back to the place where you live?: Yes               Activities of Daily Living Home Assistive Devices/Equipment: None ADL Screening (condition at time of admission) Patient's cognitive ability adequate to safely complete daily activities?: Yes Is the patient deaf or have difficulty hearing?: No Does the patient have difficulty seeing, even when wearing glasses/contacts?: No Does the patient have difficulty concentrating, remembering, or making decisions?: No Patient able to express need for assistance with ADLs?: Yes Does the patient have difficulty dressing or bathing?: No Independently performs ADLs?: Yes (appropriate for developmental age) Does the patient have difficulty walking or climbing stairs?: Yes(secondary to shortness of breath) Weakness of Legs: None Weakness of Arms/Hands: None  Permission Sought/Granted Permission sought to share information with : Case  Manager                Emotional Assessment Appearance:: Appears stated age     Orientation: : Oriented to Self, Oriented to Place, Oriented to  Time, Oriented to Situation      Admission diagnosis:  Thoracic aortic aneurysm without rupture ([place]) [I71.2] Compression fracture of body of thoracic vertebra ([place]) [S22.000A] Acute on chronic congestive heart failure, unspecified heart failure type ([place]) [I50.9] Acute CHF (congestive heart failure) ([place]) [I50.9] Patient Active Problem List   Diagnosis Date Noted  . Acute CHF (congestive heart failure) ([place]) 11/04/2018  . Essential hypertension 11/04/2018  . Acute on chronic systolic (congestive) heart failure ([place]) 11/04/2018  . Compression fracture of body of thoracic vertebra (H[facility])   . Abnormal nuclear stress test   . Cardiomyopathy ([place])   . Abnormal cardiac function test 09/16/2015  . Hypertensive heart disease without CHF 09/16/2015  . Hyperlipidemia 09/16/2015  . Aortic atherosclerosis ([place]) 09/16/2015  . History of hepatitis C 09/16/2015   PCP:  [name], MD Pharmacy:   [facility] [place], [place] [address] Phone: [phone] Fax: [phone]     Social Determinants of Health (SDOH) Interventions    Readmission Risk Interventions No flowsheet data found.

## 2018-11-05 NOTE — Consult Note (Addendum)
Advanced Heart Failure Team Consult Note   Primary Physician: [name], MD PCP-Cardiologist:  No primary care provider on file.  Reason for Consultation: CHF  HPI:    Keith Nguyen. is seen today for evaluation of CHF at the request of Dr. Harrell Gave.   Keith Nguyen has a history of chronic systolic CHF, HTN, CKD stage 3, HCV/cirrhosis, smoking/COPD.  Patient had an echo in 2017 with EF 40%.  He then had LHC showing mild nonobstructive CAD and EF by LV-gram 35-40%.  He was seen by Dr. Wynonia Lawman at that time in 2017 but has not been seen by a cardiologist since then.  He has also been found to have cirrhosis, probably due to HCV which has been treated with Harvoni.  He was a smoker but quit in 9/20.  He has history of COPD listed but no PFTs.  He drinks 1 pint liquor per week, quit 1 month ago.  He has drunk about this much for a long time, denies heavier ETOH.  He has a long history of HTN.  Per patient, his father and 2 uncles had CHF, but for at least 1 of these it was related to MI/coronary disease.    He reports weight loss over the last couple of months.  He has not had an appetite.  No evidence for malignancy has been found.  He has been short of breath with most exertion for the last 2 weeks.  He has been orthopneic and has had trouble sleeping.  No chest pain.  He came to the ER due to dyspnea and was admitted.  He had an elevated D dimer and had CTA chest to rule out PE, there was no PE found.  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  He was started on IV Lasix, currently on Lasix gtt 4 mg/hr.  He has not diuresed much and creatinine has risen, now up to 2.1 from 1.29 at admission.  He has also been hyperkalemic. Losartan was stopped.  Toprol XL has been cut back to 100 mg daily.  He has had diastolic hypertension.   Review of Systems: All systems reviewed and negative except as per HPI.   Home Medications Prior to  Admission medications   Medication Sig Start Date End Date Taking? Authorizing Provider  albuterol (PROVENTIL HFA;VENTOLIN HFA) 108 (90 Base) MCG/ACT inhaler Inhale 1-2 puffs into the lungs every 6 (six) hours as needed for wheezing or shortness of breath. 03/24/18  Yes Wieters, Hallie C, PA-C  allopurinol (ZYLOPRIM) 100 MG tablet Take 100 mg by mouth daily.   Yes [provider]  aspirin 81 MG tablet Take 81 mg by mouth daily.   Yes [provider]  furosemide (LASIX) 40 MG tablet Take 40 mg by mouth daily.   Yes [provider]  hydrALAZINE (APRESOLINE) 25 MG tablet Take 25 mg by mouth 2 (two) times daily.   Yes [provider]  losartan (COZAAR) 100 MG tablet Take 100 mg by mouth daily.   Yes [provider]  metoprolol succinate (TOPROL-XL) 100 MG 24 hr tablet Take 200 mg by mouth daily. Take with or immediately following a meal.   Yes [provider]    Past Medical History: 1. Cirrhosis: Possibly due to HCV.  Also has history of ETOH but not heavy per his report.  2. HCV: Treated with Harvoni. 3. Hyperlipidemia 4. HTN 5. COPD: Prior smoker, quit 9/20.  6. CKD: stage 3.  7. Ascending aortic aneurysm: 4.9 cm on 10/20 CTA.  8. Chronic systolic CHF: Nonischemic cardiomyopathy.  - Echo in 2017 with EF 40%.  - LHC (2017) showed nonobstructive mild CAD.  - Echo (10/20): EF 20-25%, mild LVH, prominent apical trabeculations, mild RV dilation with moderately decreased systolic function, mild AI, mild-moderate Keith.   Past Surgical History: Past Surgical History:  Procedure Laterality Date  . CARDIAC CATHETERIZATION N/A 09/22/2015   Procedure: Left Heart Cath and Coronary Angiography;  Surgeon: Troy Sine, MD;  Location: [facility];  Service: Cardiovascular;  Laterality: N/A;  . TONSILLECTOMY      Family History: Family History  Problem Relation Age of Onset  . Stroke Mother   . Lung cancer Father   . Hypertension Sister      Social History: Social History   Socioeconomic History  . Marital status: Married    Spouse name: Not on file  . Number of children: Not on file  . Years of education: Not on file  . Highest education level: Not on file  Occupational History  . Not on file  Social Needs  . Financial resource strain: Not on file  . Food insecurity    Worry: Not on file    Inability: Not on file  . Transportation needs    Medical: Not on file    Non-medical: Not on file  Tobacco Use  . Smoking status: Current Every Day Smoker  . Smokeless tobacco: Never Used  Substance and Sexual Activity  . Alcohol use: Yes    Alcohol/week: 4.0 - 6.0 standard drinks    Types: 4 - 6 Shots of liquor per week    Comment: Pt drinks beer and liquor daily  . Drug use: No  . Sexual activity: Not on file  Lifestyle  . Physical activity    Days per week: Not on file    Minutes per session: Not on file  . Stress: Not on file  Relationships  . Social Herbalist on phone: Not on file    Gets together: Not on file    Attends religious service: Not on file    Active member of club or organization: Not on file    Attends meetings of clubs or organizations: Not on file    Relationship status: Not on file  Other Topics Concern  . Not on file  Social History Narrative  . Not on file    Allergies:  Allergies  Allergen Reactions  . Lipitor [Atorvastatin]     Elevated liver enzymes    Objective:    Vital Signs:   Temp:  [97.5 F (36.4 C)-98.7 F (37.1 C)] 97.7 F (36.5 C) (10/14 0504) Pulse Rate:  [57-64] 59 (10/14 0504) Resp:  [18-20] 18 (10/14 0504) BP: (103-122)/(75-98) 122/93 (10/14 0504) SpO2:  [91 %-100 %] 100 % (10/14 0504) Weight:  [74.8 kg] 74.8 kg (10/14 0700) Last BM Date: 11/04/18  Weight change: Filed Weights   11/04/18 1234 11/04/18 1852 11/05/18 0700  Weight: 74.8 kg 74.8 kg 74.8 kg    Intake/Output:   Intake/Output Summary (Last 24 hours) at 11/05/2018 1131 Last  data filed at 11/05/2018 0839 Gross per 24 hour  Intake 279.21 ml  Output 500 ml  Net -220.79 ml      Physical Exam    General:  Well appearing. No resp difficulty HEENT: normal Neck: supple. JVP 16+ cm. Carotids 2+ bilat; no bruits. No lymphadenopathy or  thyromegaly appreciated. Cor: PMI latera. Regular rate & rhythm. 1/6 HSM apex.  No definite S3 or S4 heard. Lungs: Decreased BS at bases.  Abdomen: soft, nontender, mildly distended. No hepatosplenomegaly. No bruits or masses. Good bowel sounds. Extremities: no cyanosis, clubbing, rash, edema Neuro: alert & orientedx3, cranial nerves grossly intact. moves all 4 extremities w/o difficulty. Affect pleasant   Telemetry   NSR in 60s currently, drops to 40s-50s at times (personally reviewed).   EKG    NSR, LVH with repolarization, anterior Qs may be due to LVH (personally reviewed).   Labs   Basic Metabolic Panel: Recent Labs  Lab 11/14/2018 2018 11/04/18 0611 11/04/18 1455 11/05/18 0448  NA 136 134* 137 137  K 4.8 6.0* 5.9* 5.4*  CL 103 103 104 104  CO2 21* 20* 16* 18*  GLUCOSE 99 109* 61* 91  BUN 18 20 23  32*  CREATININE 1.29* 1.81* 1.81* 2.10*  CALCIUM 9.5 9.4 9.4 9.7    Liver Function Tests: Recent Labs  Lab 11/04/18 0611  AST 46*  ALT 15  ALKPHOS 67  BILITOT 1.8*  PROT 8.5*  ALBUMIN 3.8   No results for input(s): LIPASE, AMYLASE in the last 168 hours. No results for input(s): AMMONIA in the last 168 hours.  CBC: Recent Labs  Lab 10/31/2018 2018 11/04/18 0611 11/05/18 0448  WBC 4.2 4.2 5.1  NEUTROABS  --   --  3.1  HGB 15.2 15.3 15.0  HCT 49.2 48.7 47.4  MCV 95.7 94.7 92.9  PLT 180 201 208    Cardiac Enzymes: No results for input(s): CKTOTAL, CKMB, CKMBINDEX, TROPONINI in the last 168 hours.  BNP: BNP (last 3 results) Recent Labs    10/29/2018 2019  BNP >4,500.0*    ProBNP (last 3 results) No results for input(s): PROBNP in the last 8760 hours.   CBG: No results for input(s): GLUCAP  in the last 168 hours.  Coagulation Studies: No results for input(s): LABPROT, INR in the last 72 hours.   Imaging   [place] Ekg Site Rite  Result Date: 11/05/2018 If Site Rite image not attached, placement could not be confirmed due to current cardiac rhythm.     Medications:     Current Medications: . allopurinol  100 mg Oral Daily  . aspirin EC  81 mg Oral Daily  . isosorbide-hydrALAZINE  0.5 tablet Oral TID  . [START ON 11/21/2018] metoprolol succinate  25 mg Oral Daily  . sodium chloride flush  3 mL Intravenous Q12H     Infusions: . furosemide (LASIX) infusion 4 mg/hr (11/04/18 1911)  . milrinone         Assessment/Plan   1. Acute on chronic systolic CHF: Nonischemic cardiomyopathy diagnosed in 2017 with echo showing EF 40% and LHC showing mild nonobstructive CAD.  He saw Dr. Wynonia Lawman in the past, but no cardiology evaluation since 2017.  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  He has drunk moderate ETOH in the past (no longer drinks) but does not seem to have drunk enough to cause a cardiomyopathy.  He is markedly volume overloaded on exam with biventricular failure, NYHA class IV.  With rise in creatinine and poor diuresis, I am concerned for low output HF.   - Place PICC line now, will follow CVP and co-ox.   - Send co-ox off PICC then will start him on milrinone  0.25 mcg/kg/min for now.  - After starting milrinone, increase Lasix gtt to 12 mg/hr and follow response.  - Stop hydralazine and NTG paste, will start Bidil 1/2 tab tid.  - Decrease Toprol XL to 25 mg daily with volume overload and concern for low output (also with low HR at times).  - RHC tomorrow. With fall in EF, would consider eventual coronary angiography but not right now with creatinine rise. Discussed risks/benefits with patient, he agrees to  procedure.  - Will repeat limited echo with contrast given extensive LV trabeculations to fully rule out thrombus.  2. AKI on CKD stage 3: Suspect this may be a combination of cardiorenal syndrome and contrast nephropathy (had CTA chest at admission).   - Avoid further contrast.  - He is significantly volume overloaded, so will push diuresis. Will support cardiac output with milrinone.  3. Cirrhosis: Suspect due to HCV, this has been treated with Harvoni.  He was drinking moderate ETOH as well but has quit.  4. COPD/smoking: He recently quit smoking.    We will transfer over to [facility] to manage on HF service.   Length of Stay: 1  Loralie Champagne, MD  11/05/2018, 11:31 AM  Advanced Heart Failure Team Pager [phone] (M-F; 7a - 4p)  Please contact [facility] for night-coverage after hours (4p -7a ) and weekends on [email or web address]

## 2018-11-05 NOTE — Progress Notes (Signed)
Pt mentally unable to watch the CCTV video due to confusion.

## 2018-11-05 NOTE — Progress Notes (Signed)
PROGRESS NOTE    Keith Nguyen.  [id number] DOB: [birth year] DOA: 10/25/2018 PCP: [name], MD    Brief Narrative:  68 year old male with history of systolic heart failure, previous ejection fraction 40% and nonobstructive coronary artery disease in 2017 presented to the emergency room with worsening shortness of breath more than a month.  He was visiting his daughter in [place], had been complaining of shortness of breath with ambulation, worsening for the last 3 days came to the ER.  In the emergency room, patient was found with acute systolic heart failure and admitted to the hospital.   Assessment & Plan:   Principal Problem:   Acute CHF (congestive heart failure) (HCC) Active Problems:   Hyperlipidemia   History of hepatitis C   Essential hypertension   Acute on chronic systolic (congestive) heart failure (HCC)  Acute on chronic systolic congestive heart failure: With history of chronic combined heart failure. EF 20 to 25%. Followed by heart failure team.  Currently hemodynamically stable. Transfer to [facility], will stay at stepdown unit until bed available. Continue Lasix, increasing dose of Lasix infusion.  Started on milrinone.  PICC line for advanced heart failure therapies. Will need ultimate cardiac catheterization once renal function stabilizes. Decreasing dose of beta-blockers. Patient was on losartan at home, on hold for hyperkalemia and acute renal failure.  Acute renal failure: Probably due to bolus diuresis.  Presented with creatinine of 1.29-1.8-2.1 today.  Also received contrast on presentation per CTA. Urine output 500 mL last 24 hours.  Positive balance.  No urinary obstruction. Urinalysis with no cast. Will check renal ultrasound.  Continue with strict intake and output monitoring.  Hypertension: As per #1.  History of hep C: Treated with Harvoni.  LFTs normal.   DVT prophylaxis: SCDs Code Status: Full code Family Communication:  Daughter at the bedside, other daughter on the phone. Disposition Plan: Stepdown, transferred to [facility] heart failure unit.   Consultants:   Cardiology  Procedures:   None  Antimicrobials:   None   Subjective: Patient seen and examined.  Daughter was at the bedside.  No overnight events.  Comfortable at rest, short of breath on ambulation.  No leg swelling.  Objective: Vitals:   11/04/18 1933 11/04/18 2058 11/05/18 0504 11/05/18 0700  BP:  103/75 (!) 122/93   Pulse:  (!) 57 (!) 59   Resp:  18 18   Temp:  (!) 97.5 F (36.4 C) 97.7 F (36.5 C)   TempSrc:  Oral Oral   SpO2: 96% 91% 100%   Weight:    74.8 kg  Height:        Intake/Output Summary (Last 24 hours) at 11/05/2018 1321 Last data filed at 11/05/2018 0839 Gross per 24 hour  Intake 279.21 ml  Output 500 ml  Net -220.79 ml   Filed Weights   11/04/18 1234 11/04/18 1852 11/05/18 0700  Weight: 74.8 kg 74.8 kg 74.8 kg    Examination:  General exam: Appears calm and comfortable, on minimum oxygen. Respiratory system: Bilateral fine crackles at posterior base. Respiratory effort normal.  Not on any distress at rest. Cardiovascular system: S1 & S2 heard, RRR.  JVD elevated, no murmurs, rubs, gallops or clicks. No pedal edema. Gastrointestinal system: Abdomen is nondistended, soft and nontender. No organomegaly or masses felt. Normal bowel sounds heard. Central nervous system: Alert and oriented. No focal neurological deficits. Extremities: Symmetric 5 x 5 power. Skin: No rashes, lesions or ulcers Psychiatry: Judgement and insight appear normal. Mood &  affect appropriate.     Data Reviewed: I have personally reviewed following labs and imaging studies  CBC: Recent Labs  Lab 10/27/2018 2018 11/04/18 0611 11/05/18 0448  WBC 4.2 4.2 5.1  NEUTROABS  --   --  3.1  HGB 15.2 15.3 15.0  HCT 49.2 48.7 47.4  MCV 95.7 94.7 92.9  PLT 180 201 [id number]   Basic Metabolic Panel: Recent Labs  Lab 10/27/2018 2018  11/04/18 0611 11/04/18 1455 11/05/18 0448  NA 136 134* 137 137  K 4.8 6.0* 5.9* 5.4*  CL 103 103 104 104  CO2 21* 20* 16* 18*  GLUCOSE 99 109* 61* 91  BUN 18 20 23  32*  CREATININE 1.29* 1.81* 1.81* 2.10*  CALCIUM 9.5 9.4 9.4 9.7   GFR: Estimated Creatinine Clearance: 35.1 mL/min (A) (by C-G formula based on SCr of 2.1 mg/dL (H)). Liver Function Tests: Recent Labs  Lab 11/04/18 0611  AST 46*  ALT 15  ALKPHOS 67  BILITOT 1.8*  PROT 8.5*  ALBUMIN 3.8   No results for input(s): LIPASE, AMYLASE in the last 168 hours. No results for input(s): AMMONIA in the last 168 hours. Coagulation Profile: No results for input(s): INR, PROTIME in the last 168 hours. Cardiac Enzymes: No results for input(s): CKTOTAL, CKMB, CKMBINDEX, TROPONINI in the last 168 hours. BNP (last 3 results) No results for input(s): PROBNP in the last 8760 hours. HbA1C: No results for input(s): HGBA1C in the last 72 hours. CBG: No results for input(s): GLUCAP in the last 168 hours. Lipid Profile: No results for input(s): CHOL, HDL, LDLCALC, TRIG, CHOLHDL, LDLDIRECT in the last 72 hours. Thyroid Function Tests: Recent Labs    11/04/18 0611  TSH 10.048*   Anemia Panel: No results for input(s): VITAMINB12, FOLATE, FERRITIN, TIBC, IRON, RETICCTPCT in the last 72 hours. Sepsis Labs: No results for input(s): PROCALCITON, LATICACIDVEN in the last 168 hours.  Recent Results (from the past 240 hour(s))  SARS CORONAVIRUS 2 (TAT 6-24 HRS) Nasopharyngeal Nasopharyngeal Swab     Status: None   Collection Time: 11/04/18 12:29 AM   Specimen: Nasopharyngeal Swab  Result Value Ref Range Status   SARS Coronavirus 2 NEGATIVE NEGATIVE Final    Comment: (NOTE) SARS-CoV-2 target nucleic acids are NOT DETECTED. The SARS-CoV-2 RNA is generally detectable in upper and lower respiratory specimens during the acute phase of infection. Negative results do not preclude SARS-CoV-2 infection, do not rule out co-infections with  other pathogens, and should not be used as the sole basis for treatment or other patient management decisions. Negative results must be combined with clinical observations, patient history, and epidemiological information. The expected result is Negative. Fact Sheet for Patients: SugarRoll.be Fact Sheet for Healthcare Providers: https://www.woods-mathews.com/ This test is not yet approved or cleared by the [place] FDA and  has been authorized for detection and/or diagnosis of SARS-CoV-2 by FDA under an Emergency Use Authorization (EUA). This EUA will remain  in effect (meaning this test can be used) for the duration of the COVID-19 declaration under Section 56 4(b)(1) of the Act, 21 U.S.C. section 360bbb-3(b)(1), unless the authorization is terminated or revoked sooner. Performed at [facility], [place] [address], [place], [place] [address]          Radiology Studies: Dg Chest 2 View  Result Date: 10/31/2018 CLINICAL DATA:  Shortness of breath EXAM: CHEST - 2 VIEW COMPARISON:  September 16, 2015 FINDINGS: There is slight bibasilar atelectasis. Subtle patchy opacity noted in the posterior left base. There  is no edema or consolidation. Heart is borderline enlarged with pulmonary vascularity normal. No adenopathy. There is aortic atherosclerosis. There is an old healed fracture of the left clavicle. IMPRESSION: Mild bibasilar atelectasis. Suspect small focus of pneumonia posterior left base. Lungs elsewhere clear. Mild cardiac enlargement. Aortic Atherosclerosis (ICD10-I70.0). Followup PA and lateral chest radiographs recommended in 3-4 weeks following trial of antibiotic therapy to ensure resolution and exclude underlying malignancy. Electronically Signed   By: Lowella Grip III M.D.   On: 11/06/2018 19:17   Ct Angio Chest Pe W And/or Wo Contrast  Result Date: 11/13/2018 CLINICAL DATA:  Short of breath, positive D-dimer, history of  CHF EXAM: CT ANGIOGRAPHY CHEST WITH CONTRAST TECHNIQUE: Multidetector CT imaging of the chest was performed using the standard protocol during bolus administration of intravenous contrast. Multiplanar CT image reconstructions and MIPs were obtained to evaluate the vascular anatomy. CONTRAST:  35mL OMNIPAQUE IOHEXOL 350 MG/ML SOLN COMPARISON:  Radiograph 11/08/2018 FINDINGS: Cardiovascular: There is suboptimal opacification of the pulmonary arteries likely due to right-sided heart failure with extensive mixing artifact seen within the central pulmonary arteries and poor opacification beyond the lobar levels. No convincing central or lobar arterial filling defect is seen. The aorta is dilated with the ascending aorta measuring up to 4.9 cm. The aorta remains unopacified therefore precluding luminal evaluation. Atheromatous plaque is present throughout the aorta predominantly the descending portions. There is pronounced cardiomegaly with biatrial enlargement. Contrast refluxes into the hepatic veins and into the azygos and hemi azygous. Atherosclerotic calcification of the coronary arteries. There is a small pericardial effusion. Mediastinum/Nodes: There are few low-attenuation, prominent lymph nodes, largest in the left axilla measuring up to 9 mm. No acute abnormality of the trachea or esophagus. Thyroid gland and thoracic inlet is unremarkable aside from distension of the jugular veins. Lungs/Pleura: Extensive respiratory motion artifact limits evaluation of the lung parenchyma. There is a moderate right and small left pleural effusion. A pickle paraseptal emphysematous change/bullous disease is noted. No consolidative process is evident. No pneumothorax. Upper Abdomen: There is a markedly nodular surface contour of the liver. Contrast refluxes prominently into the hepatic veins. Upper abdominal atherosclerotic calcification of the aorta is noted. There is bilateral perinephric stranding and a small volume fluid in  the retroperitoneum. Musculoskeletal: Circumferential body wall edema is noted with a relative paucity of subcutaneous fat. There is mild bilateral gynecomastia. There is a likely chronic compression deformity the T11 vertebral body with approximately 50% height loss centrally. No acute or suspicious osseous lesions are evident. Review of the MIP images confirms the above findings. IMPRESSION: 1. Suboptimal opacification of the pulmonary arteries likely due to right-sided heart failure with extensive mixing artifact seen within the central pulmonary arteries and poor opacification beyond the lobar levels. No convincing large central or lobar arterial filling defect. 2. Marked cardiomegaly with biatrial enlargement. 3. Markedly nodular surface contour of the liver, such finding could be seen with cirrhosis or hepatic congestion. 4. Features of anasarca including circumferential body wall edema, pericardial effusion, moderate right and small left pleural effusion. 5. Probable chronic compression deformity of the T11 vertebral body with approximately 50% height loss centrally. Could correlate with point tenderness if there is concern for acute fracture. 6. Ascending thoracic aortic aneurysm. Recommend semi-annual imaging followup by CTA or MRA and referral to cardiothoracic surgery if not already obtained. This recommendation follows 2010 ACCF/AHA/AATS/ACR/ASA/SCA/SCAI/SIR/STS/SVM Guidelines for the Diagnosis and Management of Patients With Thoracic Aortic Disease. Circulation. 2010; [id number]. Aortic aneurysm NOS (ICD10-I71.9) 7. Aortic  atherosclerosis (ICD10-I70.0). Electronically Signed   By: Lovena Le M.D.   On: 11/13/2018 23:52   [place] Ekg Site Rite  Result Date: 11/05/2018 If Site Rite image not attached, placement could not be confirmed due to current cardiac rhythm.       Scheduled Meds: . allopurinol  100 mg Oral Daily  . aspirin EC  81 mg Oral Daily  . isosorbide-hydrALAZINE  0.5 tablet  Oral TID  . [START ON 11/17/2018] metoprolol succinate  25 mg Oral Daily  . sodium chloride flush  3 mL Intravenous Q12H   Continuous Infusions: . furosemide (LASIX) infusion 4 mg/hr (11/04/18 1911)  . milrinone       LOS: 1 day    Time spent: 35 minutes    Barb Merino, MD [facility] Pager [phone]  If 7PM-7AM, please contact night-coverage [email or web address] Password [email or web address] 11/05/2018, 1:21 PM

## 2018-11-05 NOTE — Progress Notes (Signed)
Peripherally Inserted Central Catheter/Midline Placement  The IV Nurse has discussed with the patient and/or persons authorized to consent for the patient, the purpose of this procedure and the potential benefits and risks involved with this procedure.  The benefits include less needle sticks, lab draws from the catheter, and the patient may be discharged home with the catheter. Risks include, but not limited to, infection, bleeding, blood clot (thrombus formation), and puncture of an artery; nerve damage and irregular heartbeat and possibility to perform a PICC exchange if needed/ordered by physician.  Alternatives to this procedure were also discussed.  Bard Power PICC patient education guide, fact sheet on infection prevention and patient information card has been provided to patient /or left at bedside.    PICC/Midline Placement Documentation  PICC Double Lumen [id number] PICC Right Basilic 42 cm 0 cm (Active)  Indication for Insertion or Continuance of Line Vasoactive infusions 11/05/18 1558  Exposed Catheter (cm) 0 cm 11/05/18 1558  Site Assessment Clean;Dry;Intact 11/05/18 1558  Lumen #1 Status Flushed;Saline locked;Blood return noted 11/05/18 1558  Lumen #2 Status Flushed;Saline locked;Blood return noted 11/05/18 1558  Dressing Type Transparent 11/05/18 1558  Dressing Status Clean;Dry;Intact;Antimicrobial disc in place 11/05/18 1558  Dressing Change Due 11/12/18 11/05/18 1558       Gordan Payment 11/05/2018, 4:01 PM

## 2018-11-05 NOTE — Progress Notes (Signed)
Progress Note  Patient Name: Keith Nguyen. Date of Encounter: 11/05/2018  Primary Cardiologist: New to [facility]; Dr. Wynona Neat  Subjective   Patient reports improvement in breathing but still with SOB with minimal activity. No complaints of chest pain or palpitations.   Inpatient Medications    Scheduled Meds: . allopurinol  100 mg Oral Daily  . aspirin EC  81 mg Oral Daily  . hydrALAZINE  25 mg Oral BID  . metoprolol succinate  100 mg Oral Daily  . nitroGLYCERIN  1 inch Topical Q6H   Continuous Infusions: . furosemide (LASIX) infusion 4 mg/hr (11/04/18 1911)   PRN Meds: acetaminophen **OR** acetaminophen, albuterol, nitroGLYCERIN, ondansetron **OR** ondansetron (ZOFRAN) IV   Vital Signs    Vitals:   11/04/18 1933 11/04/18 2058 11/05/18 0504 11/05/18 0700  BP:  103/75 (!) 122/93   Pulse:  (!) 57 (!) 59   Resp:  18 18   Temp:  (!) 97.5 F (36.4 C) 97.7 F (36.5 C)   TempSrc:  Oral Oral   SpO2: 96% 91% 100%   Weight:    74.8 kg  Height:        Intake/Output Summary (Last 24 hours) at 11/05/2018 0916 Last data filed at 11/05/2018 0839 Gross per 24 hour  Intake 279.21 ml  Output 500 ml  Net -220.79 ml   Filed Weights   11/04/18 1234 11/04/18 1852 11/05/18 0700  Weight: 74.8 kg 74.8 kg 74.8 kg    Telemetry    Sinus rhythm with 1st degree AV block and occasional sinus bradycardia to the 40s - Personally Reviewed  ECG    No new tracings - Personally Reviewed  Physical Exam   GEN: No acute distress.   Neck: +JVD, no carotid bruits Cardiac: RRR, no murmurs, rubs, or gallops.  Respiratory: faint crackles at lung bases but no overt wheezing/rhonchi GI: NABS, Soft, nontender, non-distended  MS: No edema; No deformity. Neuro:  Nonfocal, moving all extremities spontaneously Psych: Normal affect   Labs    Chemistry Recent Labs  Lab 11/04/18 0611 11/04/18 1455 11/05/18 0448  NA 134* 137 137  K 6.0* 5.9* 5.4*  CL 103 104 104  CO2 20*  16* 18*  GLUCOSE 109* 61* 91  BUN 20 23 32*  CREATININE 1.81* 1.81* 2.10*  CALCIUM 9.4 9.4 9.7  PROT 8.5*  --   --   ALBUMIN 3.8  --   --   AST 46*  --   --   ALT 15  --   --   ALKPHOS 67  --   --   BILITOT 1.8*  --   --   GFRNONAA 37* 37* 31*  GFRAA 43* 43* 36*  ANIONGAP 11 17* 15     Hematology Recent Labs  Lab 11/19/2018 2018 11/04/18 0611 11/05/18 0448  WBC 4.2 4.2 5.1  RBC 5.14 5.14 5.10  HGB 15.2 15.3 15.0  HCT 49.2 48.7 47.4  MCV 95.7 94.7 92.9  MCH 29.6 29.8 29.4  MCHC 30.9 31.4 31.6  RDW 16.9* 16.7* 16.8*  PLT 180 201 208    Cardiac EnzymesNo results for input(s): TROPONINI in the last 168 hours. No results for input(s): TROPIPOC in the last 168 hours.   BNP Recent Labs  Lab 10/30/2018 2019  BNP >4,500.0*     DDimer  Recent Labs  Lab 10/29/2018 2018  DDIMER 3.54*     Radiology    Dg Chest 2 View  Result Date: 10/28/2018 CLINICAL DATA:  Shortness of  breath EXAM: CHEST - 2 VIEW COMPARISON:  September 16, 2015 FINDINGS: There is slight bibasilar atelectasis. Subtle patchy opacity noted in the posterior left base. There is no edema or consolidation. Heart is borderline enlarged with pulmonary vascularity normal. No adenopathy. There is aortic atherosclerosis. There is an old healed fracture of the left clavicle. IMPRESSION: Mild bibasilar atelectasis. Suspect small focus of pneumonia posterior left base. Lungs elsewhere clear. Mild cardiac enlargement. Aortic Atherosclerosis (ICD10-I70.0). Followup PA and lateral chest radiographs recommended in 3-4 weeks following trial of antibiotic therapy to ensure resolution and exclude underlying malignancy. Electronically Signed   By: Lowella Grip III M.D.   On: 11/13/2018 19:17   Ct Angio Chest Pe W And/or Wo Contrast  Result Date: 11/02/2018 CLINICAL DATA:  Short of breath, positive D-dimer, history of CHF EXAM: CT ANGIOGRAPHY CHEST WITH CONTRAST TECHNIQUE: Multidetector CT imaging of the chest was performed using  the standard protocol during bolus administration of intravenous contrast. Multiplanar CT image reconstructions and MIPs were obtained to evaluate the vascular anatomy. CONTRAST:  67mL OMNIPAQUE IOHEXOL 350 MG/ML SOLN COMPARISON:  Radiograph 10/25/2018 FINDINGS: Cardiovascular: There is suboptimal opacification of the pulmonary arteries likely due to right-sided heart failure with extensive mixing artifact seen within the central pulmonary arteries and poor opacification beyond the lobar levels. No convincing central or lobar arterial filling defect is seen. The aorta is dilated with the ascending aorta measuring up to 4.9 cm. The aorta remains unopacified therefore precluding luminal evaluation. Atheromatous plaque is present throughout the aorta predominantly the descending portions. There is pronounced cardiomegaly with biatrial enlargement. Contrast refluxes into the hepatic veins and into the azygos and hemi azygous. Atherosclerotic calcification of the coronary arteries. There is a small pericardial effusion. Mediastinum/Nodes: There are few low-attenuation, prominent lymph nodes, largest in the left axilla measuring up to 9 mm. No acute abnormality of the trachea or esophagus. Thyroid gland and thoracic inlet is unremarkable aside from distension of the jugular veins. Lungs/Pleura: Extensive respiratory motion artifact limits evaluation of the lung parenchyma. There is a moderate right and small left pleural effusion. A pickle paraseptal emphysematous change/bullous disease is noted. No consolidative process is evident. No pneumothorax. Upper Abdomen: There is a markedly nodular surface contour of the liver. Contrast refluxes prominently into the hepatic veins. Upper abdominal atherosclerotic calcification of the aorta is noted. There is bilateral perinephric stranding and a small volume fluid in the retroperitoneum. Musculoskeletal: Circumferential body wall edema is noted with a relative paucity of  subcutaneous fat. There is mild bilateral gynecomastia. There is a likely chronic compression deformity the T11 vertebral body with approximately 50% height loss centrally. No acute or suspicious osseous lesions are evident. Review of the MIP images confirms the above findings. IMPRESSION: 1. Suboptimal opacification of the pulmonary arteries likely due to right-sided heart failure with extensive mixing artifact seen within the central pulmonary arteries and poor opacification beyond the lobar levels. No convincing large central or lobar arterial filling defect. 2. Marked cardiomegaly with biatrial enlargement. 3. Markedly nodular surface contour of the liver, such finding could be seen with cirrhosis or hepatic congestion. 4. Features of anasarca including circumferential body wall edema, pericardial effusion, moderate right and small left pleural effusion. 5. Probable chronic compression deformity of the T11 vertebral body with approximately 50% height loss centrally. Could correlate with point tenderness if there is concern for acute fracture. 6. Ascending thoracic aortic aneurysm. Recommend semi-annual imaging followup by CTA or MRA and referral to cardiothoracic surgery if not already obtained.  This recommendation follows 2010 ACCF/AHA/AATS/ACR/ASA/SCA/SCAI/SIR/STS/SVM Guidelines for the Diagnosis and Management of Patients With Thoracic Aortic Disease. Circulation. 2010; [id number]. Aortic aneurysm NOS (ICD10-I71.9) 7. Aortic atherosclerosis (ICD10-I70.0). Electronically Signed   By: Lovena Le M.D.   On: 11/21/2018 23:52    Cardiac Studies   Left heart catheterization 08/2015:  There is moderate left ventricular systolic dysfunction.  LV end diastolic pressure is normal.  Ost Ramus to Ramus lesion, 20 %stenosed.  Prox RCA lesion, 10 %stenosed.  Moderate global LV dysfunction with diffuse hypocontractility that is slightly more pronounced in the mid inferior and mid anterolateral wall on  the RAO projection with an ejection fraction of 35 to less than 40%.  No significant coronary obstructive disease with a normal left main, normal LAD, mild 20% smooth proximal ramus intermediate stenosis, large dominant normal left circumflex artery, and a small nondominant RCA with mild 10% proximal narrowing.  RECOMMENDATION: The angiographic findings were reviewed with Dr. Wynonia Lawman, the patient's primary cardiologist. He will see the patient in one week in follow-up of this catheterization. The patient will continue with current medical therapy and adjustments will be made per Dr. Wynonia Lawman.  Echocardiogram 11/04/2018: 1. Left ventricular ejection fraction, by visual estimation, is 20 to 25%. The left ventricle has severely decreased function. Mildly increased left ventricular size. There is mildly increased left ventricular hypertrophy.  2. LV apex has significant trabeculations. No clear thrombus seen. Consider repeat limited echo with definity to evaluate for apical thrombus  3. Elevated mean left atrial pressure.  4. Left ventricular diastolic Doppler parameters are consistent with restrictive filling pattern of LV diastolic filling.  5. Global right ventricle has severely reduced systolic function.The right ventricular size is mildly enlarged. No increase in right ventricular wall thickness.  6. Left atrial size was mildly dilated.  7. Right atrial size was moderately dilated.  8. Trivial pericardial effusion is present.  9. The mitral valve is normal in structure. Mild mitral valve regurgitation. 10. The tricuspid valve is abnormal. Tricuspid valve regurgitation mild-moderate. 11. The aortic valve is tricuspid Aortic valve regurgitation is mild by color flow Doppler. 12. Mildly elevated pulmonary artery systolic pressure. RVSP 40mmHg 13. The inferior vena cava is dilated in size with <50% respiratory variability, suggesting right atrial pressure of 15 mmHg.  Patient Profile     68  y.o. 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   Assessment & Plan    1. Acute on chronic combined CHF: patient presented with progressive SOB. CTA Chest with anasarca, bilateral pleural effusions R>L, pericardial effusion, and hepatic congestion. EF 35-40% on LHC in 2017 (non-ischemic). Echo this admission with EF 20-25%, mild LVH, G3DD, severely reduced RV systolic function, mild LAE, moderate RAE, trivial pericardial effusion, mild MR, mild-moderate TR, mild AI, and mildlly elevated PA pressures. Metoprolol decreased given significant bradycardia on telemetry in the ED. Started on IV lasix 40mg  BID and transitioned to lasix gtt yesterday evening. UOP is marginal with net -458mL this admission. Weight is stagnant at 165lbs. Unfortunately Cr continues to decline from 1.29>1.8>2.1 today. He has proteinuria.  C/f low output heart failure - Will place PICC line and obtain a coox - Anticipate starting milrinone once PICC line placed - May benefit from [place] - Continue metoprolol succinate 100mg  daily  - Continue to hold losartan given  rising Cr.  - Adv HF to see today - Will need to transfer care to [facility] for ongoing Adv HF care.   2. HTN: BP overall stable. Home losartan on hold given AoCKD.  - Continue hydralazine and metoprolol (reduced dose)  3. CAD: mild non-obstructive disease on LHC in 2017. No anginal complaints. Troponins mildly elevated. Suspect this is 2/2 demand ischemia - No ischemic evaluation at this time - Continue aspirin  4. HLD: no recent lipid panel. Reported to have elevated LFTs with atorvastatin. LFTs mildly elevated this admission - Could consider referral to lipid clinic at discharge  5. Ascending thoracic aneurysm: 4.9cm on CTA Chest this admission. Recommended for semi-annual  monitoring - Consider referral to TCTS at discharge for ongoing management/monitoring - Continue aggressive BP management as above  6. Acute on chronic renal insufficiency stage 3: Cr 1.29 on admission (appears to be baseline); bumped to 1.8, now 2.1 on labs today with IV diuresis. Also with 3+ protein in urine. Renal US pending - Continue to monitor closely  7. Tobacco abuse: reports quitting 3 weeks ago. Congratulated him on this achievement.  - Continue to encourage smoking cessation  For questions or updates, please contact [facility] Please consult [email or web address] for contact info under Cardiology/STEMI.      Signed, Abigail Butts, PA-C  11/05/2018, 9:16 AM   [phone]

## 2018-11-06 ENCOUNTER — Inpatient Hospital Stay (HOSPITAL_COMMUNITY): Payer: 59

## 2018-11-06 ENCOUNTER — Encounter (HOSPITAL_COMMUNITY): Admission: EM | Disposition: E | Payer: Self-pay | Source: Home / Self Care | Attending: Cardiothoracic Surgery

## 2018-11-06 DIAGNOSIS — R570 Cardiogenic shock: Secondary | ICD-10-CM | POA: Diagnosis not present

## 2018-11-06 DIAGNOSIS — I5023 Acute on chronic systolic (congestive) heart failure: Secondary | ICD-10-CM | POA: Diagnosis not present

## 2018-11-06 DIAGNOSIS — I509 Heart failure, unspecified: Secondary | ICD-10-CM

## 2018-11-06 DIAGNOSIS — Z8619 Personal history of other infectious and parasitic diseases: Secondary | ICD-10-CM

## 2018-11-06 HISTORY — PX: RIGHT HEART CATH: CATH118263

## 2018-11-06 LAB — RAPID URINE DRUG SCREEN, HOSP PERFORMED
Amphetamines: NOT DETECTED
Barbiturates: NOT DETECTED
Benzodiazepines: NOT DETECTED
Cocaine: NOT DETECTED
Opiates: NOT DETECTED
Tetrahydrocannabinol: NOT DETECTED

## 2018-11-06 LAB — CBC WITH DIFFERENTIAL/PLATELET
Abs Immature Granulocytes: 0.02 10*3/uL (ref 0.00–0.07)
Basophils Absolute: 0 10*3/uL (ref 0.0–0.1)
Basophils Relative: 0 %
Eosinophils Absolute: 0 10*3/uL (ref 0.0–0.5)
Eosinophils Relative: 1 %
HCT: 43.1 % (ref 39.0–52.0)
Hemoglobin: 13.9 g/dL (ref 13.0–17.0)
Immature Granulocytes: 0 %
Lymphocytes Relative: 26 %
Lymphs Abs: 1.2 10*3/uL (ref 0.7–4.0)
MCH: 29.3 pg (ref 26.0–34.0)
MCHC: 32.3 g/dL (ref 30.0–36.0)
MCV: 90.9 fL (ref 80.0–100.0)
Monocytes Absolute: 0.6 10*3/uL (ref 0.1–1.0)
Monocytes Relative: 13 %
Neutro Abs: 2.8 10*3/uL (ref 1.7–7.7)
Neutrophils Relative %: 60 %
Platelets: 227 10*3/uL (ref 150–400)
RBC: 4.74 MIL/uL (ref 4.22–5.81)
RDW: 16.3 % — ABNORMAL HIGH (ref 11.5–15.5)
WBC: 4.6 10*3/uL (ref 4.0–10.5)
nRBC: 0 % (ref 0.0–0.2)

## 2018-11-06 LAB — POCT I-STAT EG7
Acid-Base Excess: 4 mmol/L — ABNORMAL HIGH (ref 0.0–2.0)
Acid-Base Excess: 5 mmol/L — ABNORMAL HIGH (ref 0.0–2.0)
Bicarbonate: 29.7 mmol/L — ABNORMAL HIGH (ref 20.0–28.0)
Bicarbonate: 30.3 mmol/L — ABNORMAL HIGH (ref 20.0–28.0)
Calcium, Ion: 1.25 mmol/L (ref 1.15–1.40)
Calcium, Ion: 1.26 mmol/L (ref 1.15–1.40)
HCT: 46 % (ref 39.0–52.0)
HCT: 46 % (ref 39.0–52.0)
Hemoglobin: 15.6 g/dL (ref 13.0–17.0)
Hemoglobin: 15.6 g/dL (ref 13.0–17.0)
O2 Saturation: 68 %
O2 Saturation: 72 %
Potassium: 3.4 mmol/L — ABNORMAL LOW (ref 3.5–5.1)
Potassium: 3.4 mmol/L — ABNORMAL LOW (ref 3.5–5.1)
Sodium: 138 mmol/L (ref 135–145)
Sodium: 139 mmol/L (ref 135–145)
TCO2: 31 mmol/L (ref 22–32)
TCO2: 32 mmol/L (ref 22–32)
pCO2, Ven: 46.2 mmHg (ref 44.0–60.0)
pCO2, Ven: 46.5 mmHg (ref 44.0–60.0)
pH, Ven: 7.416 (ref 7.250–7.430)
pH, Ven: 7.423 (ref 7.250–7.430)
pO2, Ven: 35 mmHg (ref 32.0–45.0)
pO2, Ven: 37 mmHg (ref 32.0–45.0)

## 2018-11-06 LAB — BASIC METABOLIC PANEL
Anion gap: 14 (ref 5–15)
BUN: 37 mg/dL — ABNORMAL HIGH (ref 8–23)
CO2: 25 mmol/L (ref 22–32)
Calcium: 9.5 mg/dL (ref 8.9–10.3)
Chloride: 95 mmol/L — ABNORMAL LOW (ref 98–111)
Creatinine, Ser: 1.95 mg/dL — ABNORMAL HIGH (ref 0.61–1.24)
GFR calc Af Amer: 40 mL/min — ABNORMAL LOW (ref >60)
GFR calc non Af Amer: 34 mL/min — ABNORMAL LOW (ref >60)
Glucose, Bld: 177 mg/dL — ABNORMAL HIGH (ref 70–99)
Potassium: 3.8 mmol/L (ref 3.5–5.1)
Sodium: 134 mmol/L — ABNORMAL LOW (ref 135–145)

## 2018-11-06 LAB — MAGNESIUM: Magnesium: 1.8 mg/dL (ref 1.7–2.4)

## 2018-11-06 LAB — PROTIME-INR
INR: 1.7 — ABNORMAL HIGH (ref 0.8–1.2)
Prothrombin Time: 20 seconds — ABNORMAL HIGH (ref 11.4–15.2)

## 2018-11-06 LAB — ECHOCARDIOGRAM LIMITED
Height: 71 in
Weight: 2638.47 oz

## 2018-11-06 LAB — AMMONIA: Ammonia: 30 umol/L (ref 9–35)

## 2018-11-06 SURGERY — RIGHT HEART CATH
Anesthesia: LOCAL

## 2018-11-06 MED ORDER — LIDOCAINE HCL (PF) 1 % IJ SOLN
INTRAMUSCULAR | Status: AC
  Filled 2018-11-06: qty 30

## 2018-11-06 MED ORDER — METHOCARBAMOL 500 MG PO TABS: 500.0000 mg | ORAL_TABLET | Freq: Three times a day (TID) | ORAL | Status: DC | PRN

## 2018-11-06 MED ORDER — SODIUM CHLORIDE 0.9% FLUSH: 3.0000 mL | INTRAVENOUS | Status: DC | PRN

## 2018-11-06 MED ORDER — ASPIRIN 81 MG PO CHEW
81.0000 mg | CHEWABLE_TABLET | ORAL | Status: AC
  Administered 2018-11-06: 05:00:00 81 mg via ORAL
  Filled 2018-11-06: qty 1

## 2018-11-06 MED ORDER — DIGOXIN 125 MCG PO TABS
0.1250 mg | ORAL_TABLET | Freq: Every day | ORAL | Status: DC
  Administered 2018-11-06 – 2018-11-16 (×10): 0.125 mg via ORAL
  Filled 2018-11-06 (×10): qty 1

## 2018-11-06 MED ORDER — ISOSORB DINITRATE-HYDRALAZINE 20-37.5 MG PO TABS
1.0000 | ORAL_TABLET | Freq: Three times a day (TID) | ORAL | Status: DC
  Administered 2018-11-06 – 2018-11-16 (×32): 1 via ORAL
  Filled 2018-11-06 (×32): qty 1

## 2018-11-06 MED ORDER — HEPARIN (PORCINE) IN NACL 1000-0.9 UT/500ML-% IV SOLN
INTRAVENOUS | Status: DC | PRN
  Administered 2018-11-06: 500 mL

## 2018-11-06 MED ORDER — HEPARIN SODIUM (PORCINE) 5000 UNIT/ML IJ SOLN
5000.0000 [IU] | Freq: Three times a day (TID) | INTRAMUSCULAR | Status: DC
  Administered 2018-11-06 – 2018-11-11 (×16): 5000 [IU] via SUBCUTANEOUS
  Filled 2018-11-06 (×16): qty 1

## 2018-11-06 MED ORDER — ASPIRIN 81 MG PO CHEW: 81.0000 mg | CHEWABLE_TABLET | ORAL | Status: DC

## 2018-11-06 MED ORDER — LIDOCAINE HCL (PF) 1 % IJ SOLN
INTRAMUSCULAR | Status: DC | PRN
  Administered 2018-11-06: 2 mL

## 2018-11-06 MED ORDER — SPIRONOLACTONE 12.5 MG HALF TABLET
12.5000 mg | ORAL_TABLET | Freq: Every day | ORAL | Status: DC
  Administered 2018-11-06: 10:00:00 12.5 mg via ORAL
  Filled 2018-11-06: qty 1

## 2018-11-06 MED ORDER — SODIUM CHLORIDE 0.9 % IV SOLN: INTRAVENOUS | Status: DC

## 2018-11-06 MED ORDER — HEPARIN (PORCINE) IN NACL 1000-0.9 UT/500ML-% IV SOLN
INTRAVENOUS | Status: AC
  Filled 2018-11-06: qty 500

## 2018-11-06 MED ORDER — SODIUM CHLORIDE 0.9 % IV SOLN: 250.0000 mL | INTRAVENOUS | Status: DC | PRN

## 2018-11-06 MED ORDER — POTASSIUM CHLORIDE CRYS ER 20 MEQ PO TBCR
40.0000 meq | EXTENDED_RELEASE_TABLET | Freq: Once | ORAL | Status: AC
  Administered 2018-11-06: 40 meq via ORAL
  Filled 2018-11-06: qty 2

## 2018-11-06 MED ORDER — SODIUM CHLORIDE 0.9 % IV SOLN
INTRAVENOUS | Status: DC
  Administered 2018-11-06: 06:00:00 via INTRAVENOUS

## 2018-11-06 SURGICAL SUPPLY — 8 items
CATH SWAN GANZ 7F STRAIGHT (CATHETERS) ×1 IMPLANT
CATH-GARD ARROW CATH SHIELD (MISCELLANEOUS) ×2
GLIDESHEATH SLENDER 7FR .021G (SHEATH) ×1 IMPLANT
KIT HEART LEFT (KITS) ×1 IMPLANT
PACK CARDIAC CATHETERIZATION (CUSTOM PROCEDURE TRAY) ×2 IMPLANT
SHIELD CATHGARD ARROW (MISCELLANEOUS) IMPLANT
TRANSDUCER W/STOPCOCK (MISCELLANEOUS) ×2 IMPLANT
WIRE EMERALD 3MM-J .025X260CM (WIRE) ×1 IMPLANT

## 2018-11-06 NOTE — Progress Notes (Signed)
   F/U Post Cath  Follows commands. A&O x3 MAEx 4.   No complaints.   No change.   Macalister Arnaud NP-C  10:04 AM

## 2018-11-06 NOTE — Interval H&P Note (Signed)
History and Physical Interval Note:  11/05/2018 7:40 AM  Keith Nguyen.  has presented today for surgery, with the diagnosis of heart failure.  The various methods of treatment have been discussed with the patient and family. After consideration of risks, benefits and other options for treatment, the patient has consented to  Procedure(s): RIGHT HEART CATH (N/A) as a surgical intervention.  The patient's history has been reviewed, patient examined, no change in status, stable for surgery.  I have reviewed the patient's chart and labs.  Questions were answered to the patient's satisfaction.     Alby Schwabe [employer]

## 2018-11-06 NOTE — Progress Notes (Signed)
[facility] PROGRESS NOTE  Keith Nguyen. [id number] DOB: 1950-08-19 DOA: 10/28/2018 PCP: [name], MD  Brief summary   68 year old male with history of polysubstance abuse, hep c, cirrhosis, systolic heart failure, previous ejection fraction 40% and nonobstructive coronary artery disease in 2017 presented to the emergency room with worsening shortness of breath more than a month.  He was visiting his daughter in [place], had been complaining of shortness of breath with ambulation, worsening for the last 3 days came to the ER.  In the emergency room, patient was found with acute systolic heart failure and admitted to the hospital.  Assessment/Plan:  Acute on chronic systolic congestive heart failure: previous ef 40%, repeat EF 20 to 25%.. -Followed by heart failure team. On Lasix, milrinone, bidil, spironolactone, BB. Underwent RHC. Cont per cardiology.   Acute renal failure: probable contrast on presentation per CTA.; No urinary obstruction. Renal US: unremarkable.  -creatinine is at 1.9. Monitor   Hypertension: BP is stable. cont adjust BB meds as needed.  History of hep C: Treated with Harvoni.  LFTs normal.  History of polysubstance abuse. Check urine tox  Reported confusion. Resolved. Neuro exam is non focal. Ct head ordered by cards, pend. Monitor    Code Status: full Family Communication: d/w patient, RN (indicate person spoken with, relationship, and if by phone, the number) Disposition Plan: remains inpatient    Consultants:  Cardiology   Procedures:  RHC  Antibiotics: Anti-infectives (From admission, onward)   None        (indicate start date, and stop date if known)  HPI/Subjective: Reportedly confused overnight. Resolved. Currently, alert oriented. No focal neuro symptoms.  -underwent RHC per cards. On diuresis   Objective: Vitals:   11/01/2018 0937 11/02/2018 0945  BP:  (!) 150/91  Pulse: 69 67  Resp:  (!) 21  Temp:    SpO2:   97%    Intake/Output Summary (Last 24 hours) at 10/31/2018 1028 Last data filed at 11/05/2018 0930 Gross per 24 hour  Intake 703.73 ml  Output 4600 ml  Net -3896.27 ml   Filed Weights   11/04/18 1852 11/05/18 0700 11/03/2018 0204  Weight: 74.8 kg 74.8 kg 73 kg    Exam:   General:  No distress   Cardiovascular: s1,s2 rrr  Respiratory: few crackles   Abdomen: soft, nt   Musculoskeletal: no leg edema    Data Reviewed: Basic Metabolic Panel: Recent Labs  Lab 11/17/2018 2018 11/04/18 0611 11/04/18 1455 11/05/18 0448 11/13/2018 0302  NA 136 134* 137 137 134*  K 4.8 6.0* 5.9* 5.4* 3.8  CL 103 103 104 104 95*  CO2 21* 20* 16* 18* 25  GLUCOSE 99 109* 61* 91 177*  BUN 18 20 23  32* 37*  CREATININE 1.29* 1.81* 1.81* 2.10* 1.95*  CALCIUM 9.5 9.4 9.4 9.7 9.5   Liver Function Tests: Recent Labs  Lab 11/04/18 0611  AST 46*  ALT 15  ALKPHOS 67  BILITOT 1.8*  PROT 8.5*  ALBUMIN 3.8   No results for input(s): LIPASE, AMYLASE in the last 168 hours. Recent Labs  Lab 11/04/2018 0829  AMMONIA 30   CBC: Recent Labs  Lab 11/02/2018 2018 11/04/18 0611 11/05/18 0448 11/14/2018 0302  WBC 4.2 4.2 5.1 4.6  NEUTROABS  --   --  3.1 2.8  HGB 15.2 15.3 15.0 13.9  HCT 49.2 48.7 47.4 43.1  MCV 95.7 94.7 92.9 90.9  PLT 180 201 208 227   Cardiac Enzymes: No results for input(s): CKTOTAL, CKMB,  CKMBINDEX, TROPONINI in the last 168 hours. BNP (last 3 results) Recent Labs    11/10/2018 2019  BNP >4,500.0*    ProBNP (last 3 results) No results for input(s): PROBNP in the last 8760 hours.  CBG: Recent Labs  Lab 11/05/18 2006  GLUCAP 123*    Recent Results (from the past 240 hour(s))  SARS CORONAVIRUS 2 (TAT 6-24 HRS) Nasopharyngeal Nasopharyngeal Swab     Status: None   Collection Time: 11/04/18 12:29 AM   Specimen: Nasopharyngeal Swab  Result Value Ref Range Status   SARS Coronavirus 2 NEGATIVE NEGATIVE Final    Comment: (NOTE) SARS-CoV-2 target nucleic acids are NOT  DETECTED. The SARS-CoV-2 RNA is generally detectable in upper and lower respiratory specimens during the acute phase of infection. Negative results do not preclude SARS-CoV-2 infection, do not rule out co-infections with other pathogens, and should not be used as the sole basis for treatment or other patient management decisions. Negative results must be combined with clinical observations, patient history, and epidemiological information. The expected result is Negative. Fact Sheet for Patients: SugarRoll.be Fact Sheet for Healthcare Providers: https://www.woods-mathews.com/ This test is not yet approved or cleared by the [place] FDA and  has been authorized for detection and/or diagnosis of SARS-CoV-2 by FDA under an Emergency Use Authorization (EUA). This EUA will remain  in effect (meaning this test can be used) for the duration of the COVID-19 declaration under Section 56 4(b)(1) of the Act, 21 U.S.C. section 360bbb-3(b)(1), unless the authorization is terminated or revoked sooner. Performed at [facility], [place] [address], [place], [place] [address]   MRSA PCR Screening     Status: None   Collection Time: 11/05/18  2:15 PM   Specimen: Nasal Mucosa; Nasopharyngeal  Result Value Ref Range Status   MRSA by PCR NEGATIVE NEGATIVE Final    Comment:        The GeneXpert MRSA Assay (FDA approved for NASAL specimens only), is one component of a comprehensive MRSA colonization surveillance program. It is not intended to diagnose MRSA infection nor to guide or monitor treatment for MRSA infections. Performed at [facility], [place] [address] [place],  [address]      Studies: US Renal  Result Date: 11/22/2018 CLINICAL DATA:  Renal failure EXAM: RENAL / URINARY TRACT ULTRASOUND COMPLETE COMPARISON:  Ultrasound 10/20/2014 FINDINGS: Right Kidney: Renal measurements: 10.1 x 5.8 x 4.2 cm = volume: 128 mL  . Echogenicity within normal limits. No mass or hydronephrosis visualized. Left Kidney: Renal measurements: 10.1 x 5.3 x 4 cm = volume: 111 mL. Echogenicity within normal limits. No mass or hydronephrosis visualized. Bladder: Appears normal for degree of bladder distention. Post void bladder volume of 51 mL. Slightly thick-walled appearance of the bladder Other: None IMPRESSION: Normal ultrasound appearance of the kidneys Electronically Signed   By: Donavan Foil M.D.   On: 11/04/2018 03:48   [place] Ekg Site Rite  Result Date: 11/05/2018 If Site Rite image not attached, placement could not be confirmed due to current cardiac rhythm.   Scheduled Meds: . allopurinol  100 mg Oral Daily  . aspirin EC  81 mg Oral Daily  . Chlorhexidine Gluconate Cloth  6 each Topical Daily  . digoxin  0.125 mg Oral Daily  . heparin injection (subcutaneous)  5,000 Units Subcutaneous Q8H  . isosorbide-hydrALAZINE  1 tablet Oral TID  . mouth rinse  15 mL Mouth Rinse BID  . metoprolol succinate  25 mg Oral Daily  .  sodium chloride flush  10-40 mL Intracatheter Q12H  . sodium chloride flush  3 mL Intravenous Q12H  . spironolactone  12.5 mg Oral Daily   Continuous Infusions: . furosemide (LASIX) infusion 12 mg/hr (11/05/2018 0915)  . milrinone 0.375 mcg/kg/min (11/05/2018 0225)    Principal Problem:   Acute CHF (congestive heart failure) (HCC) Active Problems:   Hyperlipidemia   History of hepatitis C   Essential hypertension   Acute on chronic systolic (congestive) heart failure ([place])    Time spent: >25 minutes     Kinnie Feil  [facility] Pager [phone]. If 7PM-7AM, please contact night-coverage at [email or web address], password [facility] 11/15/2018, 10:28 AM  LOS: 2 days

## 2018-11-06 NOTE — Progress Notes (Signed)
Patient ID: Keith Scibelli., male   DOB: 05-10-1950, 68 y.o.   MRN: [id number]     Advanced Heart Failure Rounding Note  PCP-Cardiologist: No primary care provider on file.   Subjective:    Patient was confused overnight, getting in and out of bed, unable to hold still.  Oriented to person and place, not time.  No definite focal abnormalities.    Says breathing is ok and able to lie flat.  He diuresed well last night, weight down 4 lbs.   Creatinine lower at 1.95.  He is on milrinone at 0.375. BP mildly elevated.   Limited echo with Definity from yesterday was reviewed, I do not see LV thrombus.   RHC Procedural Findings (milrinone 0.375): Hemodynamics (mmHg) RA mean 14 RV 46/13 PA 49/20, 31 PCWP mean 16 Oxygen saturations: PA 70% AO 93% Cardiac Output (Fick) 5.88  Cardiac Index (Fick) 3.06 PVR 2.55 PAPI 2.07 CVP/PCWP 0.875 Cardiac Output (Thermo) 3.16 Cardiac Index (Thermo) 1.64   Objective:   Weight Range: 73 kg Body mass index is 22.45 kg/m.   Vital Signs:   Temp:  [97.7 F (36.5 C)-98.2 F (36.8 C)] 98 F (36.7 C) (10/15 0158) Pulse Rate:  [56-81] 62 (10/15 0824) Resp:  [9-37] 37 (10/15 0824) BP: (125-155)/(76-105) 134/81 (10/15 0824) SpO2:  [92 %-100 %] 95 % (10/15 0824) Weight:  [73 kg] 73 kg (10/15 0204) Last BM Date: 11/05/18  Weight change: Filed Weights   11/04/18 1852 11/05/18 0700 10/27/2018 0204  Weight: 74.8 kg 74.8 kg 73 kg    Intake/Output:   Intake/Output Summary (Last 24 hours) at 11/05/2018 0842 Last data filed at 11/07/2018 0535 Gross per 24 hour  Intake 340.64 ml  Output 3900 ml  Net -3559.36 ml      Physical Exam    General:  Well appearing. No resp difficulty HEENT: Normal Neck: Supple. JVP 10-12 cm. Carotids 2+ bilat; no bruits. No lymphadenopathy or thyromegaly appreciated. Cor: PMI lateral. Regular rate & rhythm. No rubs, gallops or murmurs. Lungs: Clear Abdomen: Soft, nontender, nondistended. No hepatosplenomegaly. No  bruits or masses. Good bowel sounds. Extremities: No cyanosis, clubbing, rash, edema Neuro: Alert but confused.    Telemetry   NSR 60s (personally reviewed)  Labs    CBC Recent Labs    11/05/18 0448 10/31/2018 0302  WBC 5.1 4.6  NEUTROABS 3.1 2.8  HGB 15.0 13.9  HCT 47.4 43.1  MCV 92.9 90.9  PLT 208 [id number]   Basic Metabolic Panel Recent Labs    11/05/18 0448 11/19/2018 0302  NA 137 134*  K 5.4* 3.8  CL 104 95*  CO2 18* 25  GLUCOSE 91 177*  BUN 32* 37*  CREATININE 2.10* 1.95*  CALCIUM 9.7 9.5   Liver Function Tests Recent Labs    11/04/18 0611  AST 46*  ALT 15  ALKPHOS 67  BILITOT 1.8*  PROT 8.5*  ALBUMIN 3.8   No results for input(s): LIPASE, AMYLASE in the last 72 hours. Cardiac Enzymes No results for input(s): CKTOTAL, CKMB, CKMBINDEX, TROPONINI in the last 72 hours.  BNP: BNP (last 3 results) Recent Labs    11/12/2018 2019  BNP >4,500.0*    ProBNP (last 3 results) No results for input(s): PROBNP in the last 8760 hours.   D-Dimer Recent Labs    11/04/2018 2018  DDIMER 3.54*   Hemoglobin A1C No results for input(s): HGBA1C in the last 72 hours. Fasting Lipid Panel No results for input(s): CHOL, HDL, LDLCALC, TRIG, CHOLHDL, LDLDIRECT in  the last 72 hours. Thyroid Function Tests Recent Labs    11/04/18 0611  TSH 10.048*    Other results:   Imaging    US Renal  Result Date: 11/15/2018 CLINICAL DATA:  Renal failure EXAM: RENAL / URINARY TRACT ULTRASOUND COMPLETE COMPARISON:  Ultrasound 10/20/2014 FINDINGS: Right Kidney: Renal measurements: 10.1 x 5.8 x 4.2 cm = volume: 128 mL . Echogenicity within normal limits. No mass or hydronephrosis visualized. Left Kidney: Renal measurements: 10.1 x 5.3 x 4 cm = volume: 111 mL. Echogenicity within normal limits. No mass or hydronephrosis visualized. Bladder: Appears normal for degree of bladder distention. Post void bladder volume of 51 mL. Slightly thick-walled appearance of the bladder Other: None  IMPRESSION: Normal ultrasound appearance of the kidneys Electronically Signed   By: Donavan Foil M.D.   On: 11/09/2018 03:48   [place] Ekg Site Rite  Result Date: 11/05/2018 If Site Rite image not attached, placement could not be confirmed due to current cardiac rhythm.     Medications:     Scheduled Medications: . [MAR Hold] allopurinol  100 mg Oral Daily  . [MAR Hold] aspirin EC  81 mg Oral Daily  . [MAR Hold] Chlorhexidine Gluconate Cloth  6 each Topical Daily  . digoxin  0.125 mg Oral Daily  . heparin injection (subcutaneous)  5,000 Units Subcutaneous Q8H  . isosorbide-hydrALAZINE  1 tablet Oral TID  . [MAR Hold] mouth rinse  15 mL Mouth Rinse BID  . [MAR Hold] metoprolol succinate  25 mg Oral Daily  . potassium chloride  40 mEq Oral Once  . [MAR Hold] sodium chloride flush  10-40 mL Intracatheter Q12H  . [MAR Hold] sodium chloride flush  3 mL Intravenous Q12H     Infusions: . sodium chloride    . sodium chloride 10 mL/hr at 11/01/2018 0533  . furosemide (LASIX) infusion 12 mg/hr (10/27/2018 0703)  . milrinone 0.375 mcg/kg/min (10/25/2018 0225)     PRN Medications:  sodium chloride, [MAR Hold] acetaminophen **OR** [MAR Hold] acetaminophen, [MAR Hold] albuterol, lidocaine (PF), [MAR Hold] nitroGLYCERIN, [MAR Hold] ondansetron **OR** [MAR Hold] ondansetron (ZOFRAN) IV, [MAR Hold] sodium chloride flush, sodium chloride flush   Assessment/Plan   1. Acute on chronic systolic CHF: Nonischemic cardiomyopathy diagnosed in 2017 with echo showing EF 40% and LHC showing mild nonobstructive CAD.  He saw Dr. Wynonia Lawman in the past, but no cardiology evaluation since 2017.  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  He has drunk moderate ETOH in the past (no longer drinks) but does not seem to have drunk enough to cause a cardiomyopathy.  He  was markedly volume overloaded on exam with biventricular failure at admission with rise in creatinine concerning for cardiorenal syndrome.  Initial co-ox 40% suggested low CO, milrinone started and increased to 0.375.  He was begun on Lasix gtt at 12 mg/hr and got a dose of metolazone yesterday.  Today, RHC showed R>L heart failure.  Cardiac output looked good on milrinone by Fick but was low by thermodilution (discordant values).  Given confusion and inability to hold still, would NOT place mechanical support at this time.  Will watch on milrinone for now.  Creatinine lower today.  - Continue milrinone 0.375, follow co-ox.  If co-ox is low in the next couple days and he can cooperate with keeping leg still, may need to reconsider mechanical support.  -  CVP 14 today with PCWP 16, will continue Lasix gtt at 12 mg/hr, continues to have good UOP.   - Can increase Bidil to 1 tab tid and add spironolactone 12.5 mg daily.  - With improved creatinine, add digoxin.   - Decreased Toprol XL to 25 mg daily with volume overload and low output (also with low HR at times).  - Will need to start thinking about LVAD as an endpoint for him.  However, I am not sure RV would support and need to workup confusion.  He also has cirrhosis.   2. AKI on CKD stage 3: Suspect this may be a combination of cardiorenal syndrome and contrast nephropathy (had CTA chest at admission).  Creatinine lower today.  - Avoid further contrast.  - Continue to support CO with milrninone.  3. Cirrhosis: Suspect due to HCV, this has been treated with Harvoni.  He was drinking moderate ETOH as well but has quit. Albumin level was normal.  - Will get INR.  - As below, will check NH3.  4. COPD/smoking: He recently quit smoking.   5. Confusion:  Oriented to person and place, not time, cannot name president.  ?Hepatic encephalopathy with history of cirrhosis.  Talked with his wife, she is pretty certain he has not had any ETOH for a month so  withdrawal seems less likely.  Nothing obviously focal on exam on cath table but need to reassess on floor.  - Check NH3 level.  - CT head w/o contrast.  - Neuro exam in room.  - Encourage family to come in to re-orient.   CRITICAL CARE Performed by: Loralie Champagne  Total critical care time: 35 minutes  Critical care time was exclusive of separately billable procedures and treating other patients.  Critical care was necessary to treat or prevent imminent or life-threatening deterioration.  Critical care was time spent personally by me on the following activities: development of treatment plan with patient and/or surrogate as well as nursing, discussions with consultants, evaluation of patient's response to treatment, examination of patient, obtaining history from patient or surrogate, ordering and performing treatments and interventions, ordering and review of laboratory studies, ordering and review of radiographic studies, pulse oximetry and re-evaluation of patient's condition.   Length of Stay: 2  Loralie Champagne, MD  10/25/2018, 8:42 AM  Advanced Heart Failure Team Pager [phone] (M-F; 7a - 4p)  Please contact [facility] for night-coverage after hours (4p -7a ) and weekends on [email or web address]

## 2018-11-06 NOTE — Plan of Care (Signed)
  Problem: Clinical Measurements: Goal: Ability to maintain clinical measurements within normal limits will improve Outcome: Progressing   Problem: Clinical Measurements: Goal: Cardiovascular complication will be avoided Outcome: Progressing   Problem: Nutrition: Goal: Adequate nutrition will be maintained Outcome: Progressing   Problem: Elimination: Goal: Will not experience complications related to urinary retention Outcome: Progressing   Problem: Pain Managment: Goal: General experience of comfort will improve Outcome: Progressing   Problem: Skin Integrity: Goal: Risk for impaired skin integrity will decrease Outcome: Progressing

## 2018-11-07 ENCOUNTER — Encounter (HOSPITAL_COMMUNITY): Payer: Self-pay | Admitting: Cardiology

## 2018-11-07 ENCOUNTER — Inpatient Hospital Stay (HOSPITAL_COMMUNITY): Payer: 59

## 2018-11-07 DIAGNOSIS — I5023 Acute on chronic systolic (congestive) heart failure: Secondary | ICD-10-CM | POA: Diagnosis not present

## 2018-11-07 DIAGNOSIS — I5021 Acute systolic (congestive) heart failure: Secondary | ICD-10-CM

## 2018-11-07 DIAGNOSIS — E782 Mixed hyperlipidemia: Secondary | ICD-10-CM

## 2018-11-07 DIAGNOSIS — E43 Unspecified severe protein-calorie malnutrition: Secondary | ICD-10-CM | POA: Insufficient documentation

## 2018-11-07 DIAGNOSIS — J449 Chronic obstructive pulmonary disease, unspecified: Secondary | ICD-10-CM

## 2018-11-07 LAB — LIPID PANEL
Cholesterol: 200 mg/dL (ref 0–200)
HDL: 36 mg/dL — ABNORMAL LOW (ref >40)
LDL Cholesterol: 147 mg/dL — ABNORMAL HIGH (ref 0–99)
Total CHOL/HDL Ratio: 5.6 RATIO
Triglycerides: 86 mg/dL (ref <150)
VLDL: 17 mg/dL (ref 0–40)

## 2018-11-07 LAB — PSA: Prostatic Specific Antigen: 0.59 ng/mL (ref 0.00–4.00)

## 2018-11-07 LAB — HEPATITIS C ANTIBODY: HCV Ab: REACTIVE — AB

## 2018-11-07 LAB — CBC WITH DIFFERENTIAL/PLATELET
Abs Immature Granulocytes: 0.02 10*3/uL (ref 0.00–0.07)
Basophils Absolute: 0 10*3/uL (ref 0.0–0.1)
Basophils Relative: 0 %
Eosinophils Absolute: 0 10*3/uL (ref 0.0–0.5)
Eosinophils Relative: 1 %
HCT: 47.7 % (ref 39.0–52.0)
Hemoglobin: 16.1 g/dL (ref 13.0–17.0)
Immature Granulocytes: 0 %
Lymphocytes Relative: 25 %
Lymphs Abs: 1.3 10*3/uL (ref 0.7–4.0)
MCH: 29.5 pg (ref 26.0–34.0)
MCHC: 33.8 g/dL (ref 30.0–36.0)
MCV: 87.4 fL (ref 80.0–100.0)
Monocytes Absolute: 0.7 10*3/uL (ref 0.1–1.0)
Monocytes Relative: 14 %
Neutro Abs: 3 10*3/uL (ref 1.7–7.7)
Neutrophils Relative %: 60 %
Platelets: 220 10*3/uL (ref 150–400)
RBC: 5.46 MIL/uL (ref 4.22–5.81)
RDW: 15.6 % — ABNORMAL HIGH (ref 11.5–15.5)
WBC: 5 10*3/uL (ref 4.0–10.5)
nRBC: 0 % (ref 0.0–0.2)

## 2018-11-07 LAB — BASIC METABOLIC PANEL
Anion gap: 13 (ref 5–15)
BUN: 31 mg/dL — ABNORMAL HIGH (ref 8–23)
CO2: 31 mmol/L (ref 22–32)
Calcium: 9.5 mg/dL (ref 8.9–10.3)
Chloride: 86 mmol/L — ABNORMAL LOW (ref 98–111)
Creatinine, Ser: 1.76 mg/dL — ABNORMAL HIGH (ref 0.61–1.24)
GFR calc Af Amer: 45 mL/min — ABNORMAL LOW (ref >60)
GFR calc non Af Amer: 39 mL/min — ABNORMAL LOW (ref >60)
Glucose, Bld: 115 mg/dL — ABNORMAL HIGH (ref 70–99)
Potassium: 3.3 mmol/L — ABNORMAL LOW (ref 3.5–5.1)
Sodium: 130 mmol/L — ABNORMAL LOW (ref 135–145)

## 2018-11-07 LAB — COOXEMETRY PANEL
Carboxyhemoglobin: 0.8 % (ref 0.5–1.5)
Methemoglobin: 0.6 % (ref 0.0–1.5)
O2 Saturation: 63.1 %
Total hemoglobin: 16.4 g/dL — ABNORMAL HIGH (ref 12.0–16.0)

## 2018-11-07 LAB — HEPATITIS B CORE ANTIBODY, IGM: Hep B C IgM: NONREACTIVE

## 2018-11-07 LAB — HEMOGLOBIN A1C
Hgb A1c MFr Bld: 6.6 % — ABNORMAL HIGH (ref 4.8–5.6)
Mean Plasma Glucose: 142.72 mg/dL

## 2018-11-07 LAB — APTT: aPTT: 33 seconds (ref 24–36)

## 2018-11-07 LAB — ABO/RH: ABO/RH(D): O POS

## 2018-11-07 LAB — LACTATE DEHYDROGENASE: LDH: 213 U/L — ABNORMAL HIGH (ref 98–192)

## 2018-11-07 LAB — AMYLASE: Amylase: 176 U/L — ABNORMAL HIGH (ref 28–100)

## 2018-11-07 LAB — LIPASE, BLOOD: Lipase: 35 U/L (ref 11–51)

## 2018-11-07 LAB — HEPATITIS B SURFACE ANTIGEN: Hepatitis B Surface Ag: NONREACTIVE

## 2018-11-07 LAB — T4, FREE: Free T4: 1.04 ng/dL (ref 0.61–1.12)

## 2018-11-07 LAB — URIC ACID: Uric Acid, Serum: 10.5 mg/dL — ABNORMAL HIGH (ref 3.7–8.6)

## 2018-11-07 LAB — ANTITHROMBIN III: AntiThromb III Func: 58 % — ABNORMAL LOW (ref 75–120)

## 2018-11-07 LAB — PREALBUMIN: Prealbumin: 10.1 mg/dL — ABNORMAL LOW (ref 18–38)

## 2018-11-07 LAB — TYPE AND SCREEN
ABO/RH(D): O POS
Antibody Screen: NEGATIVE

## 2018-11-07 MED ORDER — ENSURE ENLIVE PO LIQD
237.0000 mL | Freq: Three times a day (TID) | ORAL | Status: DC
  Administered 2018-11-07: 22:00:00 237 mL via ORAL

## 2018-11-07 MED ORDER — SPIRONOLACTONE 25 MG PO TABS
25.0000 mg | ORAL_TABLET | Freq: Every day | ORAL | Status: DC
  Administered 2018-11-07 – 2018-11-16 (×9): 25 mg via ORAL
  Filled 2018-11-07 (×9): qty 1

## 2018-11-07 MED ORDER — POTASSIUM CHLORIDE 20 MEQ PO PACK
10.0000 meq | PACK | Freq: Every day | ORAL | Status: DC
  Administered 2018-11-08 – 2018-11-09 (×2): 10 meq via ORAL
  Filled 2018-11-07 (×3): qty 1

## 2018-11-07 MED ORDER — POTASSIUM CHLORIDE CRYS ER 20 MEQ PO TBCR
40.0000 meq | EXTENDED_RELEASE_TABLET | Freq: Once | ORAL | Status: AC
  Administered 2018-11-07: 40 meq via ORAL
  Filled 2018-11-07: qty 2

## 2018-11-07 MED ORDER — MILRINONE LACTATE IN DEXTROSE 20-5 MG/100ML-% IV SOLN
0.2500 ug/kg/min | INTRAVENOUS | Status: DC
  Administered 2018-11-07 – 2018-11-16 (×17): 0.375 ug/kg/min via INTRAVENOUS
  Administered 2018-11-17: 18:00:00 0.25 ug/kg/min via INTRAVENOUS
  Administered 2018-11-17: 04:00:00 0.375 ug/kg/min via INTRAVENOUS
  Administered 2018-11-18: 15:00:00 0.25 ug/kg/min via INTRAVENOUS
  Filled 2018-11-07 (×20): qty 100

## 2018-11-07 MED ORDER — MAGNESIUM SULFATE 2 GM/50ML IV SOLN
2.0000 g | Freq: Once | INTRAVENOUS | Status: AC
  Administered 2018-11-07: 10:00:00 2 g via INTRAVENOUS
  Filled 2018-11-07: qty 50

## 2018-11-07 NOTE — Progress Notes (Signed)
Initial Nutrition Assessment  DOCUMENTATION CODES:   Severe malnutrition in context of chronic illness  INTERVENTION:   - Recommend liberalizing diet to REGULAR to promote PO intake given severe malnutrition  - Ensure Enlive po TID, each supplement provides 350 kcal and 20 grams of protein  - Magic cup TID with meals, each supplement provides 290 kcal and 9 grams of protein  NUTRITION DIAGNOSIS:   Severe Malnutrition related to chronic illness (CHF, COPD) as evidenced by severe fat depletion, severe muscle depletion, percent weight loss (19.5% weight loss in less than 8 months).  GOAL:   Patient will meet greater than or equal to 90% of their needs  MONITOR:   PO intake, Supplement acceptance, Labs, Weight trends, I & O's  REASON FOR ASSESSMENT:   Consult LVAD Eval  ASSESSMENT:   68 year old male who presented to the ED on 10/12 with SOB. PMH of CHF, HLD, CAD, COPD, HTN, CKD stage III, hepatitis C, tobacco use, cirrhosis, polysubstance abuse.   10/15 - s/p right heart cath  Spoke with pt and son at bedside. Pt reports that he has experienced a loss in appetite over the last 1-2 weeks. Pt states his appetite loss started first and then he started to lose weight. Pt's wife was on speaker phone and reports that pt actually experienced a loss of appetite starting 1-2 months ago rather than 1-2 weeks ago.  Pt reports that over the last several weeks, his PO intake has been reduced to 2 meals. Pt's wife, however, reports that pt has only been eating 1 meal and 1 snack. The meal might include 2 eggs, sausage, and toast as the pt has been preferring breakfast foods. The snack might include half of a piece of cake and ice cream.  Pt states that he ate about 50% of both his breakfast and lunch meal trays today. Pt's son shook his head, indicating that pt consumed less than 50% of his meal trays. No meal completions documented.  Pt reports that his UBW is between 165-170 lbs. Pt's son  believes that pt's UBW is closer to 175-180 lbs. Reviewed weight history in chart. Pt with a 15.5 kg (34.1 lb) weight loss since 03/24/18. This is a 19.5% weight loss in less than 8 months which is severe and significant for timeframe.  Wt Readings from Last 5 Encounters:  11/07/18 63.9 kg  03/24/18 79.4 kg  09/22/15 83.9 kg   Pt denies any issues chewing and swallowing. Pt also denies any N/V at this time. Pt states that he has bowel movements about once every 2-3 days.  Medications reviewed and include: Klor-con 10 mEq daily, spironolactone, milrinone  Labs reviewed: sodium 130, potassium 3.3, chloride 86  UOP: 6175 ml x 24 hours I/O's: -8.7 L since admit  NUTRITION - FOCUSED PHYSICAL EXAM:    Most Recent Value  Orbital Region  Moderate depletion  Upper Arm Region  Severe depletion  Thoracic and Lumbar Region  Severe depletion  Buccal Region  Moderate depletion  Temple Region  Moderate depletion  Clavicle Bone Region  Severe depletion  Clavicle and Acromion Bone Region  Severe depletion  Scapular Bone Region  Severe depletion  Dorsal Hand  Moderate depletion  Patellar Region  Severe depletion  Anterior Thigh Region  Severe depletion  Posterior Calf Region  Severe depletion  Edema (RD Assessment)  None  Hair  Reviewed  Eyes  Reviewed  Mouth  Reviewed  Skin  Reviewed  Nails  Reviewed  Diet Order:   Diet Order            Diet Heart Room service appropriate? Yes; Fluid consistency: Thin  Diet effective now              EDUCATION NEEDS:   Education needs have been addressed  Skin:  Skin Assessment: Reviewed RN Assessment  Last BM:  11/05/18  Height:   Ht Readings from Last 1 Encounters:  11/04/18 5\' 11"  (1.803 m)    Weight:   Wt Readings from Last 1 Encounters:  11/07/18 63.9 kg    Ideal Body Weight:  78.2 kg  BMI:  Body mass index is 19.65 kg/m.  Estimated Nutritional Needs:   Kcal:  2000-2200  Protein:  90-110 grams  Fluid:  >/= 1.8  L    Gaynell Face, MS, RD, LDN Inpatient Clinical Dietitian Pager: [phone] Weekend/After Hours: [phone]

## 2018-11-07 NOTE — Consult Note (Signed)
[address]       ,[place] [address]             [place] Record [id number] Date of Birth: 1950-09-11  Referring: No ref. provider found Primary Care: [name], MD Primary Cardiologist:No primary care provider on file.  Chief Complaint:    Chief Complaint  Patient presents with   Shortness of Breath    History of Present Illness:     68 year old man with past medical history of hepatitis C, hypertension, COPD, tobacco abuse, and kidney disease was admitted to [facility] earlier this week for progressive shortness of breath.  He had been seen approximately 2-1/2 years ago within the system and was known to have reduced left ventricular function.  With this admission, his left ventricular ejection fraction is noted to be 20 to 25%, with mild LVH and mild to moderate decreased right ventricular systolic function.  Coincidentally his mixed venous saturation was noted to be in the 40% range.  Therefore, he was started on milrinone drip as well as Lasix drip due to gross volume overload.  He underwent right heart catheterization demonstrating right greater than left heart failure.  Consult is received for consideration of durable LVAD.  He has a long history of smoking but apparently quit in the last month.  He also has a consistent history of alcohol use but has also quit in the last month or so.  He appears to have good family support.   Current Activity/ Functional Status: Patient will be independent with mobility/ambulation, transfers, ADL's, IADL's.   Zubrod Score: At the time of surgery this patients most appropriate activity status/level should be described as: []     0    Normal activity, no symptoms []     1    Restricted in physical strenuous activity but ambulatory, able to do out light work []     2    Ambulatory and capable of self care, unable to do work activities, up and about                 more  than 50%  Of the time                            []     3    Only limited self care, in bed greater than 50% of waking hours []     4    Completely disabled, no self care, confined to bed or chair []     5    Moribund  Past Medical History:  Diagnosis Date   Abnormal cardiac function test 09/16/2015   Aortic atherosclerosis (Rafael Hernandez) 09/16/2015   History of hepatitis C 09/16/2015   Prior treatment with Harvoni    Hyperlipidemia 09/16/2015   Hypertensive heart disease without CHF 09/16/2015    Past Surgical History:  Procedure Laterality Date   CARDIAC CATHETERIZATION N/A 09/22/2015   Procedure: Left Heart Cath and Coronary Angiography;  Surgeon: Troy Sine, MD;  Location: [facility];  Service: Cardiovascular;  Laterality: N/A;   RIGHT HEART CATH N/A 11/15/2018   Procedure: RIGHT HEART CATH;  Surgeon: Larey Dresser, MD;  Location: [facility];  Service: Cardiovascular;  Laterality: N/A;   TONSILLECTOMY      Social History   Tobacco Use  Smoking Status Current Every Day Smoker  Smokeless  Tobacco Never Used    Social History   Substance and Sexual Activity  Alcohol Use Yes   Alcohol/week: 4.0 - 6.0 standard drinks   Types: 4 - 6 Shots of liquor per week   Comment: Pt drinks beer and liquor daily     Allergies  Allergen Reactions   Lipitor [Atorvastatin]     Elevated liver enzymes    Current Facility-Administered Medications  Medication Dose Route Frequency Provider Last Rate Last Dose   acetaminophen (TYLENOL) tablet 650 mg  650 mg Oral Q6H PRN Larey Dresser, MD   650 mg at 11/04/18 1417   Or   acetaminophen (TYLENOL) suppository 650 mg  650 mg Rectal Q6H PRN Larey Dresser, MD       albuterol (VENTOLIN HFA) 108 (90 Base) MCG/ACT inhaler 2 puff  2 puff Inhalation Q6H PRN Larey Dresser, MD       allopurinol (ZYLOPRIM) tablet 100 mg  100 mg Oral Daily Larey Dresser, MD   100 mg at 11/07/18 [id number]   aspirin EC tablet 81 mg  81 mg Oral  Daily Larey Dresser, MD   81 mg at 11/07/18 [id number]   Chlorhexidine Gluconate Cloth 2 % PADS 6 each  6 each Topical Daily Barb Merino, MD   6 each at 11/10/2018 1031   digoxin (LANOXIN) tablet 0.125 mg  0.125 mg Oral Daily Larey Dresser, MD   0.125 mg at 11/07/18 [id number]   feeding supplement (ENSURE ENLIVE) (ENSURE ENLIVE) liquid 237 mL  237 mL Oral TID BM Cristal Deer, MD       heparin injection 5,000 Units  5,000 Units Subcutaneous Q8H Larey Dresser, MD   5,000 Units at 11/07/18 0529   isosorbide-hydrALAZINE (BIDIL) 20-37.5 MG per tablet 1 tablet  1 tablet Oral TID Larey Dresser, MD   1 tablet at 11/07/18 [id number]   MEDLINE mouth rinse  15 mL Mouth Rinse BID Barb Merino, MD   15 mL at 11/07/18 1005   methocarbamol (ROBAXIN) tablet 500 mg  500 mg Oral Q8H PRN Kinnie Feil, MD       metoprolol succinate (TOPROL-XL) 24 hr tablet 25 mg  25 mg Oral Daily Larey Dresser, MD   25 mg at 11/07/18 0952   milrinone (PRIMACOR) 20 MG/100 ML (0.2 mg/mL) infusion  0.375 mcg/kg/min Intravenous Continuous Cristal Deer, MD 7.19 mL/hr at 11/07/18 1518 0.375 mcg/kg/min at 11/07/18 1518   nitroGLYCERIN (NITROSTAT) SL tablet 0.4 mg  0.4 mg Sublingual Q5 min PRN Larey Dresser, MD   0.4 mg at 11/04/18 0852   ondansetron (ZOFRAN) tablet 4 mg  4 mg Oral Q6H PRN Larey Dresser, MD       Or   ondansetron [facility]) injection 4 mg  4 mg Intravenous Q6H PRN Larey Dresser, MD       [START ON 11/08/2018] potassium chloride (KLOR-CON) packet 10 mEq  10 mEq Oral Daily Cristal Deer, MD       sodium chloride flush (NS) 0.9 % injection 10-40 mL  10-40 mL Intracatheter Q12H Barb Merino, MD   10 mL at 11/10/2018 2156   sodium chloride flush (NS) 0.9 % injection 10-40 mL  10-40 mL Intracatheter PRN Barb Merino, MD       sodium chloride flush (NS) 0.9 % injection 3 mL  3 mL Intravenous Q12H Larey Dresser, MD   3 mL at 11/07/18 1005   spironolactone (ALDACTONE) tablet 25 mg  25 mg  Oral  Daily Larey Dresser, MD   25 mg at 11/07/18 [id number]    Medications Prior to Admission  Medication Sig Dispense Refill Last Dose   albuterol (PROVENTIL HFA;VENTOLIN HFA) 108 (90 Base) MCG/ACT inhaler Inhale 1-2 puffs into the lungs every 6 (six) hours as needed for wheezing or shortness of breath. 1 Inhaler 0 Past Week at Unknown time   allopurinol (ZYLOPRIM) 100 MG tablet Take 100 mg by mouth daily.   10/27/2018 at Unknown time   aspirin 81 MG tablet Take 81 mg by mouth daily.   11/16/2018 at Unknown time   furosemide (LASIX) 40 MG tablet Take 40 mg by mouth daily.   11/16/2018 at Unknown time   hydrALAZINE (APRESOLINE) 25 MG tablet Take 25 mg by mouth 2 (two) times daily.   11/05/2018 at Unknown time   losartan (COZAAR) 100 MG tablet Take 100 mg by mouth daily.   11/21/2018 at Unknown time   metoprolol succinate (TOPROL-XL) 100 MG 24 hr tablet Take 200 mg by mouth daily. Take with or immediately following a meal.   11/02/2018 at 1000    Family History  Problem Relation Age of Onset   Stroke Mother    Lung cancer Father    Hypertension Sister      Review of Systems:   ROS Pertinent items are noted in HPI.     Cardiac Review of Systems: Y or  [    ]= no  Chest Pain [  n  ]  Resting SOB [ y  ] Exertional SOB  [  ]  Orthopnea [  ]   Pedal Edema [   ]    Palpitations [  ] Syncope  [  ]   Presyncope [   ]  General Review of Systems: [Y] = yes [  ]=no Constitional: recent weight change [ y ]; anorexia [ y ]; fatigue Blue.Reese  ]; nausea [n  ]; night sweats [  ]; fever [  ]; or chills [  ]                                                               Dental: Last Dentist visit:   Eye : blurred vision [ n ]; diplopia [n   ]; vision changes [  ];  Amaurosis fugax[  ]; Resp: cough [ y ];  wheezing[n  ];  hemoptysis[n  ]; shortness of breath[ y ]; paroxysmal nocturnal dyspnea[  ]; dyspnea on exertion[ y ]; or orthopnea[  ];  GI:  gallstones[  ], vomiting[  ];  dysphagia[  ]; melena[   ];  hematochezia [  ]; heartburn[  ];   Hx of  Colonoscopy[  ]; h/o hep C treated with Harvoni GU: kidney stones [  ]; hematuria[  ];   dysuria [  ];  nocturia[  ];  history of     obstruction [  ]; urinary frequency [  ] CKD stage 3             Skin: rash, swelling[ y ];, hair loss[n  ];  peripheral edema[y ];  or itching[  ]; Musculosketetal: myalgias[ n ];  joint swelling[n  ];  joint erythema[ n ];  joint pain[  ];  back pain[  ];  Heme/Lymph:  bruising[  ];  bleeding[n  ];  anemia[  ];  Neuro: TIA[n  ];  headaches[ n ];  stroke[ n ];  vertigo[  ];  seizures[  ];   paresthesias[  ];  difficulty walking[  ];  Psych:depression[ n ]; anxiety[ n ];  Endocrine: diabetes[ n ];  thyroid dysfunction[  ];       Physical Exam: BP (!) 114/98 (BP Location: Left Leg)    Pulse (!) 103    Temp 98 F (36.7 C) (Oral)    Resp 19    Ht 5\' 11"  (1.803 m)    Wt 63.9 kg    SpO2 95%    BMI 19.65 kg/m    General appearance: alert, cooperative and no distress Head: Normocephalic, without obvious abnormality, atraumatic Neck: no adenopathy, no carotid bruit, no JVD, supple, symmetrical, trachea midline and thyroid not enlarged, symmetric, no tenderness/mass/nodules Resp: clear to auscultation bilaterally Cardio: regular rate and rhythm GI: soft, non-tender; bowel sounds normal; no masses,  no organomegaly Extremities: edema trace Neurologic: Alert and oriented X 3, normal strength and tone. Normal symmetric reflexes. Normal coordination and gait  Diagnostic Studies & Laboratory data:     Recent Radiology Findings:   Dg Chest 2 View  Result Date: 11/07/2018 CLINICAL DATA:  CHF EXAM: CHEST - 2 VIEW COMPARISON:  10/31/2018 FINDINGS: Right PICC line tip is at the cavoatrial junction. Heart is enlarged. Lungs clear. No edema. Small effusion noted on the lateral view which is likely on the right, not visualized on the AP view. No acute bony abnormality. IMPRESSION: Right PICC line tip at the cavoatrial junction.  Cardiomegaly. Small right pleural effusion. Electronically Signed   By: Rolm Baptise M.D.   On: 11/07/2018 10:40   Ct Head Wo Contrast  Result Date: 11/15/2018 CLINICAL DATA:  Follow-up stroke. EXAM: CT HEAD WITHOUT CONTRAST TECHNIQUE: Contiguous axial images were obtained from the base of the skull through the vertex without intravenous contrast. COMPARISON:  None. FINDINGS: Brain: Mild chronic ischemic white matter disease. No mass effect or midline shift is noted. Ventricular size is within normal limits. There is no evidence of mass lesion, hemorrhage or acute infarction. Vascular: No hyperdense vessel or unexpected calcification. Skull: Normal. Negative for fracture or focal lesion. Sinuses/Orbits: Left maxillary sinusitis. Other: None. IMPRESSION: Mild chronic ischemic white matter disease. No acute intracranial abnormality seen. Electronically Signed   By: Marijo Conception M.D.   On: 11/20/2018 13:13   US Renal  Result Date: 10/28/2018 CLINICAL DATA:  Renal failure EXAM: RENAL / URINARY TRACT ULTRASOUND COMPLETE COMPARISON:  Ultrasound 10/20/2014 FINDINGS: Right Kidney: Renal measurements: 10.1 x 5.8 x 4.2 cm = volume: 128 mL . Echogenicity within normal limits. No mass or hydronephrosis visualized. Left Kidney: Renal measurements: 10.1 x 5.3 x 4 cm = volume: 111 mL. Echogenicity within normal limits. No mass or hydronephrosis visualized. Bladder: Appears normal for degree of bladder distention. Post void bladder volume of 51 mL. Slightly thick-walled appearance of the bladder Other: None IMPRESSION: Normal ultrasound appearance of the kidneys Electronically Signed   By: Donavan Foil M.D.   On: 11/06/2018 03:48     I have independently reviewed the above radiologic studies and discussed with the patient and family at bedside. I have also reviewed his chest CT and echo images.   Recent Lab Findings: Lab Results  Component Value Date   WBC 5.0 11/07/2018   HGB 16.1 11/07/2018   HCT 47.7  11/07/2018   PLT 220 11/07/2018  GLUCOSE 115 (H) 11/07/2018   CHOL 200 11/07/2018   TRIG 86 11/07/2018   HDL 36 (L) 11/07/2018   LDLCALC 147 (H) 11/07/2018   ALT 15 11/04/2018   AST 46 (H) 11/04/2018   NA 130 (L) 11/07/2018   K 3.3 (L) 11/07/2018   CL 86 (L) 11/07/2018   CREATININE 1.76 (H) 11/07/2018   BUN 31 (H) 11/07/2018   CO2 31 11/07/2018   TSH 10.048 (H) 11/04/2018   INR 1.7 (H) 10/28/2018   HGBA1C 6.6 (H) 11/07/2018      Assessment / Plan:      68 yo man with reduced LV function but showing a good response to milrinone clinically. I have discussed the pros and cons of LVAD therapy with the patient, son and daughter-in-law who appear to understand the rationale and the limitations of LVAD. At present, he appears to be a viable candidate for LVAD, pending other work-up detail. I am most concerned about the degree of aortic valve regurgitation, which appears closer to moderate to me and might affect his success with LVAD long-term if not addressed at surgery. 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 Will follow up with Advanced Heart Failure Team.     I  spent 40 minutes counseling the patient face to face.   Kalkidan Caudell Z. Orvan Seen, [place] 11/07/2018 5:44 PM

## 2018-11-07 NOTE — Progress Notes (Signed)
Voice message left for pulmonary lab re: PFT test that was ordered today.

## 2018-11-07 NOTE — Progress Notes (Signed)
MCS EDUCATION NOTE:                VAD evaluation consent reviewed and signed by Keith Nguyen and designated caregiver his wife Keith Nguyen.  Initial VAD teaching completed with pt and caregiver.   VAD educational packet including "HM III Patient Handbook", "HM III Left Ventricular Assist System" packet, "[facility] HM III Patient Education", and "Decision Aids for Left Ventricular Assist Device" reviewed in detail and left at bedside for continued reference.  All questions answered regarding VAD implant, hospital stay, and what to expect when discharged home living with a heart pump. Pt identified his wife as his primary caregiver his daughter as backup caregiver should be deemed appropriate for him.  Explained need for 24/7 care when pt is discharged home due to sternal precautions, adaptation to living on support, emotional support, consistent and meticulous exit site care and management, medication adherence and high volume of follow up visits with the [facility] after discharge; both pt and caregiver verbalized understanding of above.   Explained that LVAD can be implanted for two indications in the setting of advanced left ventricular heart failure treatment:  1. Bridge to transplant - used for patients who cannot safely wait for heart transplant without this device.  Or    2. Destination therapy - used for patients until end of life or recovery of heart function.  Patient and caregiver(s) acknowledge that the indication at this point in time for LVAD therapy would be for DT due to age and recent smoking.   Reviewed and supplied a copy of home inspection check list stressing that only three pronged grounded power outlets can be used for VAD equipment. Keith Nguyen confirmed home has electrical outlets that will support the equipment.    Identified the following lifestyle modifications while living on MCS:    1. No driving for at least three months and then only if doctor gives permission to do so.    2. No tub baths while pump implanted, and shower only when doctor gives permission.   3. No swimming or submersion in water while implanted with pump.   4. No contact sports or engaging in jumping activities.   5. Always have a backup controller, charged spare batteries, and battery clips nearby at all times in case of emergency.   6. Call the doctor or hospital contact person if any change in how the pump sounds, feels, or works.   7. Plan to sleep only when connected to the power module.   8. Do not sleep on your stomach.   9. Keep a backup system controller, charged batteries, battery clips, and flashlight near you during sleep in case of electrical power outage.   10. Exit site care including dressing changes, monitoring for infection, and importance of keeping percutaneous lead stabilized at all times.     One of our current patients will visited with them over the weekend to answer questions about living on and caring for someone on MCS; to assist with decision making.   Reviewed pictures of VAD drive line, site care, dressing changes, and drive line stabilization including securement attachment device and abdominal binder. Discussed with pt and family that they will be required to purchase dressing supplies as long as patient has the VAD in place.   She will also need to abide by sternal precautions with no lifting >10lbs, pushing, pulling and will need assistance with adapting to new life style with VAD equipment and care.   Intermacs  patient survival statistics through March 2020 reviewed with patient and caregiver as follows:                                               The patient understands that from this discussion it does not mean that they will receive the device, but that depends on an extensive evaluation process. The patient is aware of the fact that if at anytime they want to stop the evaluation process they can.  All questions have been answered at this time and contact  information was provided should they encounter any further questions.  They are both agreeable at this time to the evaluation process and will move forward.    Total Session Time: 60 minutes  Tanda Rockers, RN VAD Coordinator   Office: [phone] 24/7 VAD Pager: [phone]

## 2018-11-07 NOTE — Progress Notes (Addendum)
PROGRESS NOTE  Keith Nguyen. BN:[id number] DOB: December 15, 1950 DOA: 10/27/2018 PCP: [name], MD  Brief Narrative: 68 year old male with history of polysubstance abuse, hepatitis C, cirrhosis, systolic heart failure, with previous ejection fraction of 40% and nonobstructive coronary artery disease in 2017 he was admitted to the emergency room while visiting his daughter and complained of shortness of breath with difficulty ambulating that worsened over 3 days.   Interval history/Subjective: Patient seen and examined at bedside with his son Hilliard Clark at room  Assessment/Plan Acute on chronic systolic congestive heart failure with a previous ejection fraction of 40% but a repeat ejection fraction is 20 to 25% he is being He is being followed up by the heart failure team. He is on Lasix amiodarone BiDil spironolactone beta-blocker He underwent R HC, cardiology heart failure team is considering him for LVAD  2.  Acute renal failure probably due to contrast for evaluation of her disease with CTA there is no urinary obstruction and renal ultrasound is unremarkable creatinine is 1.76 we will continue to monitor  3.  Hypertension blood pressure is stable we will continue his blood beta-blocker  4History of hepatitis C he is status post treatment with Harvoni in his LFT has remained normal.  5History of polysubstance abuse  6History of alcohol use but he has not had any alcohol for over 1 month  7He had a transient confusion which has resolved could have been from change of environment due to being hospitalized and low ejection fraction/output was negative  8COPD/tobacco smoking he has recently quit smoking  9Hypokalemia.  Will replace patient is Lasix amiodarone spironolactone  10.Mild hyponatremia we will continue to monitor continue Lasix   DVT prophylaxis: Heparin Code Status: Full Family Communication: Son his son at bedside Disposition Plan: Home when stable Time spent 35 minutes   Dr. Kyung Bacca [facility] Pager [phone]  11/07/2018, 8:52 AM  LOS: 3 days   Consultants:  Heart failure team  Procedures:  None  Antimicrobials:  None   Objective: Vitals:  Vitals:   11/07/18 0553 11/07/18 0645  BP:    Pulse:    Resp: 15 14  Temp:    SpO2: 90% 95%    Exam:  Constitutional:  . Appears calm and comfortable Eyes:  . pupils and irises appear normal . Normal lids and conjunctivae ENMT:  . grossly normal hearing  . Lips appear normal . external ears, nose appear normal . Oropharynx: mucosa, tongue,posterior pharynx appear normal Neck:  . neck appears normal, no masses, normal ROM, supple . no thyromegaly Respiratory:  . CTA bilaterally, no w/r/r.  . Respiratory effort normal. No retractions or accessory muscle use Cardiovascular:  . RRR, no m/r/g . No LE extremity edema   . Normal pedal pulses Abdomen:  . Abdomen appears normal; no tenderness or masses . No hernias . No HSM Musculoskeletal:  . Digits/nails BUE: no clubbing, cyanosis, petechiae, infection . exam of joints, bones, muscles of at least one of following: head/neck, RUE, LUE, RLE, LLE   o strength and tone normal, no atrophy, no abnormal movements o No tenderness, masses o Normal ROM, no contractures  . gait and station Skin:  . No rashes, lesions, ulcers . palpation of skin: no induration or nodules Neurologic:  . CN 2-12 intact . Sensation all 4 extremities intact Psychiatric:  . Mental status o Mood, affect appropriate o Orientation to person, place, time  . judgment and insight appear intact     I have personally reviewed the following:  11/07/18 [id number]  Basic metabolic panel  Collected: 11/07/18 0420  Final result  Specimen: Blood   Sodium 130Low  mmol/L  Creatinine 1.76High  mg/dL  Potassium 3.3Low  mmol/L Calcium 9.5 mg/dL  Chloride 86Low  mmol/L GFR, Est Non African American 39Low  mL/min  CO2 31 mmol/L GFR, Est African American 45Low  mL/min   Glucose 115High  mg/dL Anion gap 13   BUN 31High  mg/dL         11/07/18 0442  CBC with Differential/Platelet  Collected: 11/07/18 0420  Final result  Specimen: Blood   WBC 5.0 K/uL NEUT# 3.0 K/uL  RBC 5.46 MIL/uL Lymphocytes 25 %  Hemoglobin 16.1 g/dL Lymphocyte # 1.3 K/uL  HCT 47.7 % Monocytes Relative 14 %  MCV 87.4 fL Monocyte # 0.7 K/uL  MCH 29.5 pg Eosinophil 1 %  MCHC 33.8 g/dL Eosinophils Absolute 0.0 K/uL  RDW 15.6High  % Basophil 0 %  Platelets 220 K/uL Basophils Absolute 0.0 K/uL  nRBC 0.0 % Immature Granulocytes 0 %  Neutrophils 60 % Abs Immature Granulocytes 0.02 K/uL             Data: Scheduled Meds: . allopurinol  100 mg Oral Daily  . aspirin EC  81 mg Oral Daily  . Chlorhexidine Gluconate Cloth  6 each Topical Daily  . digoxin  0.125 mg Oral Daily  . heparin injection (subcutaneous)  5,000 Units Subcutaneous Q8H  . isosorbide-hydrALAZINE  1 tablet Oral TID  . mouth rinse  15 mL Mouth Rinse BID  . metoprolol succinate  25 mg Oral Daily  . potassium chloride  40 mEq Oral Once  . sodium chloride flush  10-40 mL Intracatheter Q12H  . sodium chloride flush  3 mL Intravenous Q12H  . spironolactone  25 mg Oral Daily   Continuous Infusions: . furosemide (LASIX) infusion 12 mg/hr (11/07/18 0550)  . milrinone 0.375 mcg/kg/min (11/07/18 0300)    Principal Problem:   Acute CHF (congestive heart failure) (HCC) Active Problems:   Hyperlipidemia   History of hepatitis C   Essential hypertension   Acute on chronic systolic (congestive) heart failure ([place])   LOS: 3 days

## 2018-11-07 NOTE — Progress Notes (Signed)
Patient ID: Keith Borey., male   DOB: 1950-08-06, 68 y.o.   MRN: [id number]     Advanced Heart Failure Rounding Note  PCP-Cardiologist: No primary care provider on file.   Subjective:    No further confusion, he feels much better with milrinone and diuresis.  Great UOP yesterday, weight down.  Today, CVP 3-4 with co-ox 63%.  Creatinine trending down, 1.76 this morning.   Limited echo with Definity was reviewed, I do not see LV thrombus.   RHC Procedural Findings (milrinone 0.375): Hemodynamics (mmHg) RA mean 14 RV 46/13 PA 49/20, 31 PCWP mean 16 Oxygen saturations: PA 70% AO 93% Cardiac Output (Fick) 5.88  Cardiac Index (Fick) 3.06 PVR 2.55 PAPI 2.07 CVP/PCWP 0.875 Cardiac Output (Thermo) 3.16 Cardiac Index (Thermo) 1.64   Objective:   Weight Range: 63.9 kg Body mass index is 19.65 kg/m.   Vital Signs:   Temp:  [97.5 F (36.4 C)-97.9 F (36.6 C)] 97.9 F (36.6 C) (10/16 0304) Pulse Rate:  [63-75] 73 (10/16 0304) Resp:  [14-26] 14 (10/16 0645) BP: (111-157)/(58-91) 111/58 (10/16 0304) SpO2:  [88 %-97 %] 95 % (10/16 0645) Weight:  [63.9 kg] 63.9 kg (10/16 0445) Last BM Date: 11/05/18  Weight change: Filed Weights   11/05/18 0700 11/12/2018 0204 11/07/18 0445  Weight: 74.8 kg 73 kg 63.9 kg    Intake/Output:   Intake/Output Summary (Last 24 hours) at 11/07/2018 0911 Last data filed at 11/07/2018 0308 Gross per 24 hour  Intake 949.76 ml  Output 6175 ml  Net -5225.24 ml      Physical Exam    General: NAD Neck: No JVD, no thyromegaly or thyroid nodule.  Lungs: Clear to auscultation bilaterally with normal respiratory effort. CV: Nondisplaced PMI.  Heart regular S1/S2, no S3/S4, no murmur.  No peripheral edema.   Abdomen: Soft, nontender, no hepatosplenomegaly, no distention.  Skin: Intact without lesions or rashes.  Neurologic: Alert and oriented x 3.  Psych: Normal affect. Extremities: No clubbing or cyanosis.  HEENT: Normal.    Telemetry    NSR 60s (personally reviewed)  Labs    CBC Recent Labs    11/21/2018 0302  11/15/2018 0817 11/07/18 0420  WBC 4.6  --   --  5.0  NEUTROABS 2.8  --   --  3.0  HGB 13.9   < > 15.6 16.1  HCT 43.1   < > 46.0 47.7  MCV 90.9  --   --  87.4  PLT 227  --   --  220   < > = values in this interval not displayed.   Basic Metabolic Panel Recent Labs    11/10/2018 0302  11/20/2018 0817 10/28/2018 1807 11/07/18 0420  NA 134*   < > 139  --  130*  K 3.8   < > 3.4*  --  3.3*  CL 95*  --   --   --  86*  CO2 25  --   --   --  31  GLUCOSE 177*  --   --   --  115*  BUN 37*  --   --   --  31*  CREATININE 1.95*  --   --   --  1.76*  CALCIUM 9.5  --   --   --  9.5  MG  --   --   --  1.8  --    < > = values in this interval not displayed.   Liver Function Tests No results for input(s): AST,  ALT, ALKPHOS, BILITOT, PROT, ALBUMIN in the last 72 hours. No results for input(s): LIPASE, AMYLASE in the last 72 hours. Cardiac Enzymes No results for input(s): CKTOTAL, CKMB, CKMBINDEX, TROPONINI in the last 72 hours.  BNP: BNP (last 3 results) Recent Labs    10/26/2018 2019  BNP >4,500.0*    ProBNP (last 3 results) No results for input(s): PROBNP in the last 8760 hours.   D-Dimer No results for input(s): DDIMER in the last 72 hours. Hemoglobin A1C No results for input(s): HGBA1C in the last 72 hours. Fasting Lipid Panel No results for input(s): CHOL, HDL, LDLCALC, TRIG, CHOLHDL, LDLDIRECT in the last 72 hours. Thyroid Function Tests No results for input(s): TSH, T4TOTAL, T3FREE, THYROIDAB in the last 72 hours.  Invalid input(s): FREET3  Other results:   Imaging    Ct Head Wo Contrast  Result Date: 11/20/2018 CLINICAL DATA:  Follow-up stroke. EXAM: CT HEAD WITHOUT CONTRAST TECHNIQUE: Contiguous axial images were obtained from the base of the skull through the vertex without intravenous contrast. COMPARISON:  None. FINDINGS: Brain: Mild chronic ischemic white matter disease. No mass effect  or midline shift is noted. Ventricular size is within normal limits. There is no evidence of mass lesion, hemorrhage or acute infarction. Vascular: No hyperdense vessel or unexpected calcification. Skull: Normal. Negative for fracture or focal lesion. Sinuses/Orbits: Left maxillary sinusitis. Other: None. IMPRESSION: Mild chronic ischemic white matter disease. No acute intracranial abnormality seen. Electronically Signed   By: Marijo Conception M.D.   On: 10/28/2018 13:13     Medications:     Scheduled Medications: . allopurinol  100 mg Oral Daily  . aspirin EC  81 mg Oral Daily  . Chlorhexidine Gluconate Cloth  6 each Topical Daily  . digoxin  0.125 mg Oral Daily  . heparin injection (subcutaneous)  5,000 Units Subcutaneous Q8H  . isosorbide-hydrALAZINE  1 tablet Oral TID  . mouth rinse  15 mL Mouth Rinse BID  . metoprolol succinate  25 mg Oral Daily  . [START ON 11/08/2018] potassium chloride  10 mEq Oral Daily  . potassium chloride  40 mEq Oral Once  . sodium chloride flush  10-40 mL Intracatheter Q12H  . sodium chloride flush  3 mL Intravenous Q12H  . spironolactone  25 mg Oral Daily    Infusions: . milrinone 0.375 mcg/kg/min (11/07/18 0300)    PRN Medications: acetaminophen **OR** acetaminophen, albuterol, methocarbamol, nitroGLYCERIN, ondansetron **OR** ondansetron (ZOFRAN) IV, sodium chloride flush   Assessment/Plan   1. Acute on chronic systolic CHF: Nonischemic cardiomyopathy diagnosed in 2017 with echo showing EF 40% and LHC showing mild nonobstructive CAD.  He saw Dr. Wynonia Lawman in the past, but no cardiology evaluation since 2017.  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  He has drunk moderate ETOH in the past (no longer drinks) but does not seem to have drunk enough to cause a cardiomyopathy.  He was markedly volume  overloaded on exam initially with biventricular failure at admission with rise in creatinine concerning for cardiorenal syndrome. Initial co-ox 40% suggested low CO, milrinone started and increased to 0.375.  He was begun on Lasix gtt at 12 mg/hr.  RHC showed R>L heart failure.  Cardiac output looked good on milrinone by Fick but was low by thermodilution (discordant values).  Given confusion and inability to hold still at cath, I decided not to place  IABP.  This morning, co-ox 63% and CVP down to 2-3.  He feels much better.  - Continue milrinone 0.375 mcg/kg/min.  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   - Stop Lasix today with CVP 2-3, likely to po tomorrow.   - Continue digoxin.  - Continue Bidil 1 tab tid.  - Can increase spironolactone to 25 mg daily.   - Decreased Toprol XL to 25 mg daily with volume overload and low output (also with low HR at times).  - Will need to start thinking about LVAD as an endpoint for him.  With fall in CVP to 2-3 and improving renal function, I suspect his RV would support LVAD.  However, he also has cirrhosis which will be a consideration.  Interestingly, his albumin level was normal. I will initiate LVAD evaluation.  He is from [facility] but would stay with his daughter in [place].  2. AKI on CKD stage 3: Suspect this may be a combination of cardiorenal syndrome and contrast nephropathy (had CTA chest at admission).  Creatinine lower today with milrinone support.  - Avoid further contrast.  - Continue to support CO with milrninone.  3. Cirrhosis: Suspect due to HCV, this has been treated with Harvoni.  He was drinking moderate ETOH as well (not heavily) but has quit completely for > 1 month. Albumin level was normal. NH3 was normal.  INR mildly elevated, will repeat. Will need full evaluation of this if considering LVAD.  4. COPD/smoking: He recently quit smoking.   5. Confusion:  Completely resolved.  Had overnight 10/14-15.  NH3 was normal, doubt hepatic  encephalopathy.  No recent ETOH, not due to ETOH withdrawal.  Think may have been due to low output and hospitalization.  CT head unremarkable.   Length of Stay: 3  Loralie Champagne, MD  11/07/2018, 9:11 AM  Advanced Heart Failure Team Pager [phone] (M-F; [place])  Please contact [facility] for night-coverage after hours (4p -7a ) and weekends on [email or web address]

## 2018-11-07 NOTE — Evaluation (Signed)
Physical Therapy Evaluation Patient Details Name: Keith Nguyen. MRN: [id number] DOB: 05-14-1950 Today's Date: 11/07/2018   History of Present Illness  Patient is a 68 y/o male who presents with SOB. Found to have Acute on chronic systolic CHF. s/p cath 10/15. PMH includes Hep C, HTN, HLD.  Clinical Impression  Patient presents with impaired balance, generalized weakness, dyspnea on exertion and impaired mobility s/p above. Pt independent PTA, lives with wife in [place] and visiting daughter in [place]. Reports he gets short of breath and dizzy at home sometimes but just takes rest breaks and it gets better. Today, pt tolerated transfers and gait training with min guard-Min A for balance/safety with 2 LOB during turns. Reports feeling weak. Sp02 remained >93% on RA during activity and dropped to 87% once back in bed. Donned 3L/min 02. Encouraged walking with nursing a few times per day. Will follow acutely to maximize independence and mobility prior to return home.     Follow Up Recommendations No PT follow up;Supervision - Intermittent    Equipment Recommendations  None recommended by PT    Recommendations for Other Services       Precautions / Restrictions Precautions Precautions: Fall Precaution Comments: watch 02 Restrictions Weight Bearing Restrictions: No      Mobility  Bed Mobility Overal bed mobility: Modified Independent             General bed mobility comments: No assist needed.  Transfers Overall transfer level: Needs assistance Equipment used: None Transfers: Sit to/from Stand Sit to Stand: Min guard         General transfer comment: Min guard for safety. Slow to rise. no dizziness.  Ambulation/Gait Ambulation/Gait assistance: Min assist Gait Distance (Feet): 125 Feet Assistive device: None Gait Pattern/deviations: Step-through pattern;Decreased stride length;Staggering left;Narrow base of support Gait velocity: decreased   General Gait Details:  Slow, mildly unsteady gait with narrow BoS and LOB during turns x2 requiring min A for support; Sp02 remained >93% on RA, dropped to 87% once back to room when he returned to supine.  Stairs            Wheelchair Mobility    Modified Rankin (Stroke Patients Only)       Balance Overall balance assessment: Needs assistance Sitting-balance support: Feet supported;No upper extremity supported Sitting balance-Leahy Scale: Good     Standing balance support: During functional activity Standing balance-Leahy Scale: Fair Standing balance comment: Able to stand statically without UE support; requires Min A for walking at times due to unsteadiness.                             Pertinent Vitals/Pain Pain Assessment: No/denies pain    Home Living Family/patient expects to be discharged to:: Private residence Living Arrangements: Spouse/significant other Available Help at Discharge: Family;Available 24 hours/day Type of Home: House Home Access: Level entry     Home Layout: One level Home Equipment: None      Prior Function Level of Independence: Independent   Gait / Transfers Assistance Needed: Independent, drives.  ADL's / Homemaking Assistance Needed: Does his own ADLs  Comments: Pt and wife visiting from [facility], staying with daughter at d/c in [place] (above house setup daughter's house)     Hand Dominance        Extremity/Trunk Assessment   Upper Extremity Assessment Upper Extremity Assessment: Defer to OT evaluation    Lower Extremity Assessment Lower Extremity Assessment: Generalized weakness  Cervical / Trunk Assessment Cervical / Trunk Assessment: Normal  Communication   Communication: No difficulties  Cognition Arousal/Alertness: Awake/alert Behavior During Therapy: WFL for tasks assessed/performed Overall Cognitive Status: Within Functional Limits for tasks assessed                                         General Comments General comments (skin integrity, edema, etc.): Wife present in room during session.    Exercises     Assessment/Plan    PT Assessment Patient needs continued PT services  PT Problem List Decreased strength;Decreased mobility;Decreased balance;Cardiopulmonary status limiting activity       PT Treatment Interventions Therapeutic activities;Gait training;Therapeutic exercise;Patient/family education;Functional mobility training;Balance training    PT Goals (Current goals can be found in the Care Plan section)  Acute Rehab PT Goals Patient Stated Goal: to get home PT Goal Formulation: With patient/family Time For Goal Achievement: 11/21/18 Potential to Achieve Goals: Fair    Frequency Min 3X/week   Barriers to discharge        Co-evaluation               AM-PAC PT "6 Clicks" Mobility  Outcome Measure Help needed turning from your back to your side while in a flat bed without using bedrails?: None Help needed moving from lying on your back to sitting on the side of a flat bed without using bedrails?: None Help needed moving to and from a bed to a chair (including a wheelchair)?: A Little Help needed standing up from a chair using your arms (e.g., wheelchair or bedside chair)?: A Little Help needed to walk in hospital room?: A Little Help needed climbing 3-5 steps with a railing? : A Little 6 Click Score: 20    End of Session Equipment Utilized During Treatment: Gait belt;Oxygen Activity Tolerance: Patient tolerated treatment well Patient left: in bed;with call bell/phone within reach;with family/visitor present Nurse Communication: Mobility status PT Visit Diagnosis: Unsteadiness on feet (R26.81);Muscle weakness (generalized) (M62.81);Difficulty in walking, not elsewhere classified (R26.2)    Time: [id number] PT Time Calculation (min) (ACUTE ONLY): 17 min   Charges:   PT Evaluation $PT Eval Moderate Complexity: 1 Mod          Wray Kearns, PT, DPT Acute Rehabilitation Services Pager [phone] Office [phone]      [place] 11/07/2018, 1:07 PM

## 2018-11-08 ENCOUNTER — Inpatient Hospital Stay (HOSPITAL_COMMUNITY): Payer: 59

## 2018-11-08 DIAGNOSIS — I5023 Acute on chronic systolic (congestive) heart failure: Secondary | ICD-10-CM | POA: Diagnosis not present

## 2018-11-08 LAB — COOXEMETRY PANEL
Carboxyhemoglobin: 0.8 % (ref 0.5–1.5)
Methemoglobin: 0.4 % (ref 0.0–1.5)
O2 Saturation: 71.2 %
Total hemoglobin: 16.7 g/dL — ABNORMAL HIGH (ref 12.0–16.0)

## 2018-11-08 LAB — COMPREHENSIVE METABOLIC PANEL
ALT: 57 U/L — ABNORMAL HIGH (ref 0–44)
AST: 104 U/L — ABNORMAL HIGH (ref 15–41)
Albumin: 3 g/dL — ABNORMAL LOW (ref 3.5–5.0)
Alkaline Phosphatase: 71 U/L (ref 38–126)
Anion gap: 13 (ref 5–15)
BUN: 32 mg/dL — ABNORMAL HIGH (ref 8–23)
CO2: 32 mmol/L (ref 22–32)
Calcium: 9.3 mg/dL (ref 8.9–10.3)
Chloride: 79 mmol/L — ABNORMAL LOW (ref 98–111)
Creatinine, Ser: 1.42 mg/dL — ABNORMAL HIGH (ref 0.61–1.24)
GFR calc Af Amer: 58 mL/min — ABNORMAL LOW (ref >60)
GFR calc non Af Amer: 50 mL/min — ABNORMAL LOW (ref >60)
Glucose, Bld: 124 mg/dL — ABNORMAL HIGH (ref 70–99)
Potassium: 3.7 mmol/L (ref 3.5–5.1)
Sodium: 124 mmol/L — ABNORMAL LOW (ref 135–145)
Total Bilirubin: 1.4 mg/dL — ABNORMAL HIGH (ref 0.3–1.2)
Total Protein: 7.4 g/dL (ref 6.5–8.1)

## 2018-11-08 LAB — CBC WITH DIFFERENTIAL/PLATELET
Abs Immature Granulocytes: 0.02 10*3/uL (ref 0.00–0.07)
Basophils Absolute: 0 10*3/uL (ref 0.0–0.1)
Basophils Relative: 0 %
Eosinophils Absolute: 0 10*3/uL (ref 0.0–0.5)
Eosinophils Relative: 1 %
HCT: 48.3 % (ref 39.0–52.0)
Hemoglobin: 16.3 g/dL (ref 13.0–17.0)
Immature Granulocytes: 0 %
Lymphocytes Relative: 29 %
Lymphs Abs: 1.5 10*3/uL (ref 0.7–4.0)
MCH: 29.3 pg (ref 26.0–34.0)
MCHC: 33.7 g/dL (ref 30.0–36.0)
MCV: 86.7 fL (ref 80.0–100.0)
Monocytes Absolute: 0.9 10*3/uL (ref 0.1–1.0)
Monocytes Relative: 17 %
Neutro Abs: 2.8 10*3/uL (ref 1.7–7.7)
Neutrophils Relative %: 53 %
Platelets: 216 10*3/uL (ref 150–400)
RBC: 5.57 MIL/uL (ref 4.22–5.81)
RDW: 15.1 % (ref 11.5–15.5)
WBC: 5.4 10*3/uL (ref 4.0–10.5)
nRBC: 0 % (ref 0.0–0.2)

## 2018-11-08 LAB — PROTIME-INR
INR: 1.2 (ref 0.8–1.2)
Prothrombin Time: 15.3 seconds — ABNORMAL HIGH (ref 11.4–15.2)

## 2018-11-08 LAB — MAGNESIUM
Magnesium: 19.7 mg/dL (ref 1.7–2.4)
Magnesium: 2.1 mg/dL (ref 1.7–2.4)

## 2018-11-08 LAB — LUPUS ANTICOAGULANT PANEL
DRVVT: 38.1 s (ref 0.0–47.0)
PTT Lupus Anticoagulant: 37.7 s (ref 0.0–51.9)

## 2018-11-08 LAB — HEPATITIS B SURFACE ANTIBODY, QUANTITATIVE: Hep B S AB Quant (Post): <3.1 m[IU]/mL — ABNORMAL LOW (ref >9.9)

## 2018-11-08 MED ORDER — POLYETHYLENE GLYCOL 3350 17 G PO PACK
17.0000 g | PACK | Freq: Every day | ORAL | Status: DC | PRN
  Administered 2018-11-08 – 2018-11-12 (×4): 17 g via ORAL
  Filled 2018-11-08 (×3): qty 1

## 2018-11-08 MED ORDER — ENSURE ENLIVE PO LIQD
237.0000 mL | Freq: Two times a day (BID) | ORAL | Status: DC
  Administered 2018-11-08 – 2018-11-09 (×4): 237 mL via ORAL

## 2018-11-08 MED ORDER — LORATADINE 10 MG PO TABS
10.0000 mg | ORAL_TABLET | Freq: Every day | ORAL | Status: DC
  Administered 2018-11-08 – 2018-11-23 (×13): 10 mg via ORAL
  Filled 2018-11-08 (×13): qty 1

## 2018-11-08 MED ORDER — GADOBUTROL 1 MMOL/ML IV SOLN
7.0000 mL | Freq: Once | INTRAVENOUS | Status: AC | PRN
  Administered 2018-11-08: 7 mL via INTRAVENOUS

## 2018-11-08 NOTE — Progress Notes (Signed)
Pt's saturation was down to 80's when  oxygen tried to wean from 4 to 2 l later bumped to 4 l again to sustain pulse ox.  Otherwise pt is stable, vitals stable, denies CP, distress, ambulated in a hallway with oxygen, Milrinone continue, pt is in NPO this time for this evening MRI  Will continue to monitor the patient  Palma Holter, RN

## 2018-11-08 NOTE — Progress Notes (Signed)
Pt ambulated around unit.  4l o2.  Tolerated well.  Saunders Revel T

## 2018-11-08 NOTE — Progress Notes (Addendum)
PROGRESS NOTE  Keith Nguyen. [id number] DOB: [birth year] DOA: 11/16/2018 PCP: [name], MD  Brief Narrative: 68 year old male with history of polysubstance abuse, hepatitis C, cirrhosis, systolic heart failure, with previous ejection fraction of 40% and nonobstructive coronary artery disease in 2017 he was admitted to the emergency room while visiting his daughter and complained of shortness of breath with difficulty ambulating that worsened over 3 days.   Interval history/Subjective: Patient seen and examined at bedside with his son Hilliard Clark at room  Update: November 08, 2018.  Patient seen and examined at bedside he said he is doing well.  Nurse reported that he was unable to be weaned down from oxygen he was saturating at 80% and was restarted on his oxygen.  Cardiology is still following.  Have started work-up for LVAD  Assessment/Plan 1.Acute on chronic systolic congestive heart failure with a previous ejection fraction of 40% but a repeat ejection fraction is 20 to 25% he is being He is being followed up by the heart failure team. He is on Lasix amiodarone BiDil spironolactone beta-blocker He underwent R HC, cardiology heart failure team is considering him for LVAD  2.  Acute renal failure probably due to contrast for evaluation of her disease with CTA there is no urinary obstruction and renal ultrasound is unremarkable creatinine is 1.76 we will continue to monitor  3.  Hypertension blood pressure is stable we will continue his blood beta-blocker  4.History of hepatitis C he is status post treatment with Harvoni in his LFT has remained normal.  5.History of polysubstance abuse  6.History of alcohol use but he has not had any alcohol for over 1 month  7.He had a transient confusion which has resolved could have been from change of environment due to being hospitalized and low ejection fraction/output was negative  8.COPD/tobacco smoking he has recently quit  smoking  9.Hypokalemia.  Will replace patient is Lasix amiodarone spironolactone  10.Mild hyponatremia we will continue to monitor continue Lasix   DVT prophylaxis: Heparin Code Status: Full Family Communication: Son his son at bedside Disposition Plan: Home when stable  Time spent 25 minutes  Dr. Kyung Bacca [facility] Pager [phone]  11/08/2018, 6:49 PM  LOS: 4 days   Consultants:  Heart failure team  Procedures:  None  Antimicrobials:  None   Objective: Vitals:  Vitals:   11/08/18 1133 11/08/18 1507  BP: 120/77   Pulse: 85   Resp: 18   Temp: 98.1 F (36.7 C) 98.1 F (36.7 C)  SpO2: 98%     Exam:  Constitutional:   Appears calm and comfortable Eyes:   pupils and irises appear normal  Normal lids and conjunctivae ENMT:   grossly normal hearing   Lips appear normal  external ears, nose appear normal  Oropharynx: mucosa, tongue,posterior pharynx appear normal Neck:   neck appears normal, no masses, normal ROM, supple  no thyromegaly Respiratory:   CTA bilaterally, no w/r/r.   Respiratory effort normal. No retractions or accessory muscle use Cardiovascular:   RRR, no m/r/g  No LE extremity edema    Normal pedal pulses Abdomen:   Abdomen appears normal; no tenderness or masses  No hernias  No HSM Musculoskeletal:   Digits/nails BUE: no clubbing, cyanosis, petechiae, infection  exam of joints, bones, muscles of at least one of following: head/neck, RUE, LUE, RLE, LLE   o strength and tone normal, no atrophy, no abnormal movements o No tenderness, masses o Normal ROM, no contractures   gait and station  Skin:   No rashes, lesions, ulcers  palpation of skin: no induration or nodules Neurologic:   CN 2-12 intact  Sensation all 4 extremities intact Psychiatric:   Mental status o Mood, affect appropriate o Orientation to person, place, time   judgment and insight appear intact     I have personally reviewed  the following:   11/07/18 [id number]  Basic metabolic panel  Collected: 11/07/18 0420   Final result   Specimen: Blood   Sodium 130Low  mmol/L  Creatinine 1.76High  mg/dL  Potassium 3.3Low  mmol/L Calcium 9.5 mg/dL  Chloride 86Low  mmol/L GFR, Est Non African American 39Low  mL/min  CO2 31 mmol/L GFR, Est African American 45Low  mL/min  Glucose 115High  mg/dL Anion gap 13   BUN 31High  mg/dL         11/07/18 0442  CBC with Differential/Platelet  Collected: 11/07/18 0420   Final result   Specimen: Blood   WBC 5.0 K/uL NEUT# 3.0 K/uL  RBC 5.46 MIL/uL Lymphocytes 25 %  Hemoglobin 16.1 g/dL Lymphocyte # 1.3 K/uL  HCT 47.7 % Monocytes Relative 14 %  MCV 87.4 fL Monocyte # 0.7 K/uL  MCH 29.5 pg Eosinophil 1 %  MCHC 33.8 g/dL Eosinophils Absolute 0.0 K/uL  RDW 15.6High  % Basophil 0 %  Platelets 220 K/uL Basophils Absolute 0.0 K/uL  nRBC 0.0 % Immature Granulocytes 0 %  Neutrophils 60 % Abs Immature Granulocytes 0.02 K/uL             Data: Scheduled Meds:  allopurinol  100 mg Oral Daily   aspirin EC  81 mg Oral Daily   Chlorhexidine Gluconate Cloth  6 each Topical Daily   digoxin  0.125 mg Oral Daily   feeding supplement (ENSURE ENLIVE)  237 mL Oral BID BM   heparin injection (subcutaneous)  5,000 Units Subcutaneous Q8H   isosorbide-hydrALAZINE  1 tablet Oral TID   mouth rinse  15 mL Mouth Rinse BID   metoprolol succinate  25 mg Oral Daily   potassium chloride  10 mEq Oral Daily   sodium chloride flush  10-40 mL Intracatheter Q12H   sodium chloride flush  3 mL Intravenous Q12H   spironolactone  25 mg Oral Daily   Continuous Infusions:  milrinone 0.375 mcg/kg/min (11/08/18 0240)    Principal Problem:   Acute on chronic congestive heart failure (HCC) Active Problems:   Hyperlipidemia   History of hepatitis C   Essential hypertension   Acute on chronic systolic (congestive) heart failure (HCC)   Protein-calorie malnutrition, severe   LOS: 4 days

## 2018-11-08 NOTE — Progress Notes (Signed)
CARDIAC REHAB PHASE I   PRE:  Rate/Rhythm: 91 NSR  BP:  Sitting: 123/77        SaO2: 98 4L  MODE:  Ambulation: 340 ft   POST:  Rate/Rhythm: 100 NSR  BP:  Sitting: 120/77        SaO2: 95 4L  1115 -   Pt ambulated with assistance x1 for 340 ft. Gait was strong. Pt denies CP and SOB. Pt returned to chair with call bell within reach. Pt was eating lung. Pt encouraged to ask nursing staff to walk 1-2 more times today.   Philis Kendall, MS, ACSM CEP 11/08/2018 11:39 AM

## 2018-11-08 NOTE — Progress Notes (Addendum)
Patient ID: Keith Nguyen., male   DOB: [birth year], 68 y.o.   MRN: [id number]     Advanced Heart Failure Rounding Note  PCP-Cardiologist: No primary care provider on file.   Subjective:    No further confusion, he feels much better with milrinone and diuresis.  Today, CVP remains low at 3 with co-ox 71%.  Creatinine trending down, 1.42 this morning. Eating well.   CT chest with moderate emphysema, 4.5 cm ascending aorta.   CT abdomen with cirrhosis, ?gallstone, subtle 10 x 10 mm lesion head/neck pancreas.   Limited echo with Definity was reviewed, I do not see LV thrombus.   RHC Procedural Findings (milrinone 0.375): Hemodynamics (mmHg) RA mean 14 RV 46/13 PA 49/20, 31 PCWP mean 16 Oxygen saturations: PA 70% AO 93% Cardiac Output (Fick) 5.88  Cardiac Index (Fick) 3.06 PVR 2.55 PAPI 2.07 CVP/PCWP 0.875 Cardiac Output (Thermo) 3.16 Cardiac Index (Thermo) 1.64   Objective:   Weight Range: 65.3 kg Body mass index is 20.08 kg/m.   Vital Signs:   Temp:  [97.7 F (36.5 C)-98 F (36.7 C)] 97.7 F (36.5 C) (10/17 0800) Pulse Rate:  [82-103] 92 (10/17 0800) Resp:  [14-20] 20 (10/17 0800) BP: (100-132)/(61-98) 100/75 (10/17 0800) SpO2:  [92 %-95 %] 95 % (10/17 0800) Weight:  [65.3 kg] 65.3 kg (10/17 0355) Last BM Date: 11/05/18  Weight change: Filed Weights   11/05/2018 0204 11/07/18 0445 11/08/18 0355  Weight: 73 kg 63.9 kg 65.3 kg    Intake/Output:   Intake/Output Summary (Last 24 hours) at 11/08/2018 0838 Last data filed at 11/08/2018 0600 Gross per 24 hour  Intake 683.03 ml  Output 2775 ml  Net -2091.97 ml      Physical Exam    General: NAD Neck: No JVD, no thyromegaly or thyroid nodule.  Lungs: Clear to auscultation bilaterally with normal respiratory effort. CV: Nondisplaced PMI.  Heart regular S1/S2, no S3/S4, no murmur.  No peripheral edema.   Abdomen: Soft, nontender, no hepatosplenomegaly, no distention.  Skin: Intact without lesions or  rashes.  Neurologic: Alert and oriented x 3.  Psych: Normal affect. Extremities: No clubbing or cyanosis.  HEENT: Normal.    Telemetry   NSR 70s (personally reviewed)  Labs    CBC Recent Labs    11/07/18 0420 11/08/18 0415  WBC 5.0 5.4  NEUTROABS 3.0 2.8  HGB 16.1 16.3  HCT 47.7 48.3  MCV 87.4 86.7  PLT 220 [id number]   Basic Metabolic Panel Recent Labs    11/07/2018 1807 11/07/18 0420 11/08/18 0415  NA  --  130* 124*  K  --  3.3* 3.7  CL  --  86* 79*  CO2  --  31 32  GLUCOSE  --  115* 124*  BUN  --  31* 32*  CREATININE  --  1.76* 1.42*  CALCIUM  --  9.5 9.3  MG 1.8  --  19.7*   Liver Function Tests Recent Labs    11/08/18 0415  AST 104*  ALT 57*  ALKPHOS 71  BILITOT 1.4*  PROT 7.4  ALBUMIN 3.0*   Recent Labs    11/07/18 1645  LIPASE 35  AMYLASE 176*   Cardiac Enzymes No results for input(s): CKTOTAL, CKMB, CKMBINDEX, TROPONINI in the last 72 hours.  BNP: BNP (last 3 results) Recent Labs    11/07/2018 2019  BNP >4,500.0*    ProBNP (last 3 results) No results for input(s): PROBNP in the last 8760 hours.   D-Dimer No results for  input(s): DDIMER in the last 72 hours. Hemoglobin A1C Recent Labs    11/07/18 1645  HGBA1C 6.6*   Fasting Lipid Panel Recent Labs    11/07/18 1645  CHOL 200  HDL 36*  LDLCALC 147*  TRIG 86  CHOLHDL 5.6   Thyroid Function Tests No results for input(s): TSH, T4TOTAL, T3FREE, THYROIDAB in the last 72 hours.  Invalid input(s): FREET3  Other results:   Imaging    Ct Abdomen Pelvis Wo Contrast  Result Date: 11/07/2018 CLINICAL DATA:  Assessment for ventricular assist device. EXAM: CT CHEST, ABDOMEN AND PELVIS WITHOUT CONTRAST TECHNIQUE: Multidetector CT imaging of the chest, abdomen and pelvis was performed following the standard protocol without IV contrast. COMPARISON:  CT chest 01/03/2019 FINDINGS: CT CHEST FINDINGS Cardiovascular: Heart size remains enlarged without pericardial effusion. Aortic  atherosclerosis is present with tortuosity of the thoracic aorta and mild in your is more caliber of 4.5 cm greatest axial dimension. Right-sided PICC line in situ terminating at the caval to atrial junction. Central pulmonary arteries are cords. Maximal caliber of main pulmonary artery at 4 cm. Coronary artery calcifications. Mediastinum/Nodes: No signs of adenopathy in the chest. Lungs/Pleura: Diminished right effusion and resolution of left effusion since prior study. Basilar atelectasis on the right. Airways are patent. Minimal atelectasis on the left. No pulmonary mass or dense consolidation. Paraseptal and centrilobular emphysema paraseptal changes predominate at the lung apices. Changes are moderate. Musculoskeletal: No signs of chest wall mass. CT ABDOMEN PELVIS FINDINGS Hepatobiliary: Nodular hepatic contour. Fissural widening. Decompressed gallbladder, motion limited assessment with cholelithiasis and large calculus likely in the neck of the gallbladder measuring 12 x 7 mm. No signs of biliary ductal dilation at this time. Pancreas: Mild main pancreatic duct distension. Subtle hypodense lesion in the head of the pancreas measures 10 x 10 mm (image 71, series 3) no priors are available for comparison. Spleen: Choose 1 Adrenals/Urinary Tract: Normal adrenal glands. Some renal cortical scarring and contrast suggested within the parenchyma perhaps from recent right heart catheterization. No hydronephrosis. Stomach/Bowel: No signs of acute gastrointestinal process. Vascular/Lymphatic: Marked atherosclerotic changes of the abdominal aorta tracking in the iliac vessels also into branch vessels. 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 No signs of upper abdominal adenopathy. No signs of pelvic adenopathy. Reproductive: Calcifications in the prostate, nonspecific. Urinary bladder unremarkable. Other: No signs of free air. No ascites. Musculoskeletal: Signs of healed rib fracture of right  posterior fourth rib. Redemonstration of compression fracture at the T11 vertebral level unchanged. No acute or destructive bone process. IMPRESSION: 1. Subtle hypodense lesion in the head/neck of the pancreas measuring 10 x 10 mm. Suggest pancreatic protocol CT or MRI for further assessment. Focused ultrasound assessment could also be performed at the time of gallbladder evaluation as warranted as an initial step in evaluation if contrast administration is not possible at this time. 2. Large biliary calculus in the gallbladder neck. Correlate with any recent symptoms of abdominal pain with ultrasound as warranted. Limited assessment of the gallbladder due to motion artifact. 3. Improving appearance of pleural fluid with resolution of left and decreased size of right pleural effusion. 4. Retained contrast in renal parenchyma may be indicative of renal dysfunction, correlate with current lab values. 5. Cardiomegaly. Coronary artery calcifications. 6. Cirrhosis. 7. Marked atherosclerotic changes in the abdominal aorta. 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 Aortic Atherosclerosis (ICD10-I70.0) and Emphysema (ICD10-J43.9). Electronically Signed   By: Jewel Baize.D.  On: 11/07/2018 17:52   Dg Orthopantogram  Result Date: 11/07/2018 CLINICAL DATA:  Preoperative examination EXAM: ORTHOPANTOGRAM/PANORAMIC COMPARISON:  None. FINDINGS: Single-view orthopantogram demonstrates no fracture or bone lesion. Dentition within normal limits. No periapical lucencies. Scattered dental hardware. IMPRESSION: Single-view orthopantogram within normal limits. Electronically Signed   By: Davina Poke M.D.   On: 11/07/2018 19:48   Dg Chest 2 View  Result Date: 11/07/2018 CLINICAL DATA:  CHF EXAM: CHEST - 2 VIEW COMPARISON:  11/09/2018 FINDINGS: Right PICC line tip is at the cavoatrial junction. Heart is enlarged. Lungs clear. No edema. Small effusion noted on the lateral view which is likely  on the right, not visualized on the AP view. No acute bony abnormality. IMPRESSION: Right PICC line tip at the cavoatrial junction. Cardiomegaly. Small right pleural effusion. Electronically Signed   By: Rolm Baptise M.D.   On: 11/07/2018 10:40   Ct Chest Wo Contrast  Result Date: 11/07/2018 CLINICAL DATA:  Assessment for ventricular assist device. EXAM: CT CHEST, ABDOMEN AND PELVIS WITHOUT CONTRAST TECHNIQUE: Multidetector CT imaging of the chest, abdomen and pelvis was performed following the standard protocol without IV contrast. COMPARISON:  CT chest 01/03/2019 FINDINGS: CT CHEST FINDINGS Cardiovascular: Heart size remains enlarged without pericardial effusion. Aortic atherosclerosis is present with tortuosity of the thoracic aorta and mild in your is more caliber of 4.5 cm greatest axial dimension. Right-sided PICC line in situ terminating at the caval to atrial junction. Central pulmonary arteries are cords. Maximal caliber of main pulmonary artery at 4 cm. Coronary artery calcifications. Mediastinum/Nodes: No signs of adenopathy in the chest. Lungs/Pleura: Diminished right effusion and resolution of left effusion since prior study. Basilar atelectasis on the right. Airways are patent. Minimal atelectasis on the left. No pulmonary mass or dense consolidation. Paraseptal and centrilobular emphysema paraseptal changes predominate at the lung apices. Changes are moderate. Musculoskeletal: No signs of chest wall mass. CT ABDOMEN PELVIS FINDINGS Hepatobiliary: Nodular hepatic contour. Fissural widening. Decompressed gallbladder, motion limited assessment with cholelithiasis and large calculus likely in the neck of the gallbladder measuring 12 x 7 mm. No signs of biliary ductal dilation at this time. Pancreas: Mild main pancreatic duct distension. Subtle hypodense lesion in the head of the pancreas measures 10 x 10 mm (image 71, series 3) no priors are available for comparison. Spleen: Choose 1 Adrenals/Urinary  Tract: Normal adrenal glands. Some renal cortical scarring and contrast suggested within the parenchyma perhaps from recent right heart catheterization. No hydronephrosis. Stomach/Bowel: No signs of acute gastrointestinal process. Vascular/Lymphatic: Marked atherosclerotic changes of the abdominal aorta tracking in the iliac vessels also into branch vessels. 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 No signs of upper abdominal adenopathy. No signs of pelvic adenopathy. Reproductive: Calcifications in the prostate, nonspecific. Urinary bladder unremarkable. Other: No signs of free air. No ascites. Musculoskeletal: Signs of healed rib fracture of right posterior fourth rib. Redemonstration of compression fracture at the T11 vertebral level unchanged. No acute or destructive bone process. IMPRESSION: 1. Subtle hypodense lesion in the head/neck of the pancreas measuring 10 x 10 mm. Suggest pancreatic protocol CT or MRI for further assessment. Focused ultrasound assessment could also be performed at the time of gallbladder evaluation as warranted as an initial step in evaluation if contrast administration is not possible at this time. 2. Large biliary calculus in the gallbladder neck. Correlate with any recent symptoms of abdominal pain with ultrasound as warranted. Limited assessment of the gallbladder due to motion artifact. 3. Improving appearance  of pleural fluid with resolution of left and decreased size of right pleural effusion. 4. Retained contrast in renal parenchyma may be indicative of renal dysfunction, correlate with current lab values. 5. Cardiomegaly. Coronary artery calcifications. 6. Cirrhosis. 7. Marked atherosclerotic changes in the abdominal aorta. 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 Aortic Atherosclerosis (ICD10-I70.0) and Emphysema (ICD10-J43.9). Electronically Signed   By: Zetta Bills M.D.   On: 11/07/2018 17:52   US Liver  Doppler  Result Date: 11/08/2018 CLINICAL DATA:  Concern for cirrhosis. EXAM: DUPLEX ULTRASOUND OF LIVER TECHNIQUE: Color and duplex Doppler ultrasound was performed to evaluate the hepatic in-flow and out-flow vessels. COMPARISON:  CT abdomen pelvis-12/08/2018 FINDINGS: Liver: There is mild diffuse increased slightly coarsened echogenicity of the hepatic parenchyma. This finding is associated with nodularity of the hepatic contour (representative image 2). No discrete hepatic lesions. No intrahepatic biliary ductal dilatation. No ascites. Gallbladder: Suboptimally distended and evaluated. CBD: Normal in size measures 0.3 cm in diameter. _________________________________________________________ Main Portal Vein size: 1.1 cm Portal Vein Velocities Main Prox:  20 cm/sec Main Mid: 16 cm/sec Main Dist:  19 cm/sec Right: 16 cm/sec Left: 18 cm/sec Hepatic Vein Velocities Right:  39 cm/sec Middle:  40 cm/sec Left:  22 cm/sec IVC: Present and patent with normal respiratory phasicity. Hepatic Artery Velocity:  36 cm/sec Splenic Vein Velocity:  19 cm/sec Spleen: 11.1 cm x 8.7 cm x 4.0 cm with a total volume of 203 cm^3 (411 cm^3 is upper limit normal) Portal Vein Occlusion/Thrombus: No Splenic Vein Occlusion/Thrombus: No Ascites: None Varices: None IMPRESSION: 1. Coarsened hepatic echogenicity with nodularity hepatic contour suggestive of hepatic cirrhosis. No discrete hepatic lesions. Further evaluation could be performed with contrast-enhanced MRI or CT as clinically indicated. 2. Hepatic vasculature is patent with normal directional flow. 3. No definitive evidence of portal venous hypertension. Specifically, no evidence of splenomegaly or intra-abdominal ascites. Electronically Signed   By: Sandi Mariscal M.D.   On: 11/08/2018 04:15     Medications:     Scheduled Medications:  allopurinol  100 mg Oral Daily   aspirin EC  81 mg Oral Daily   Chlorhexidine Gluconate Cloth  6 each Topical Daily   digoxin  0.125  mg Oral Daily   feeding supplement (ENSURE ENLIVE)  237 mL Oral BID BM   heparin injection (subcutaneous)  5,000 Units Subcutaneous Q8H   isosorbide-hydrALAZINE  1 tablet Oral TID   mouth rinse  15 mL Mouth Rinse BID   metoprolol succinate  25 mg Oral Daily   potassium chloride  10 mEq Oral Daily   sodium chloride flush  10-40 mL Intracatheter Q12H   sodium chloride flush  3 mL Intravenous Q12H   spironolactone  25 mg Oral Daily    Infusions:  milrinone 0.375 mcg/kg/min (11/08/18 0240)    PRN Medications: acetaminophen **OR** acetaminophen, albuterol, methocarbamol, nitroGLYCERIN, ondansetron **OR** ondansetron (ZOFRAN) IV, sodium chloride flush   Assessment/Plan   1. Acute on chronic systolic CHF: Nonischemic cardiomyopathy diagnosed in 2017 with echo showing EF 40% and LHC showing mild nonobstructive CAD.  He saw Dr. Wynonia Lawman in the past, but no cardiology evaluation since 2017.  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  He has drunk moderate ETOH in the past (no longer drinks) but does not seem to have drunk enough to cause a cardiomyopathy.  He was markedly  volume overloaded on exam initially with biventricular failure at admission with rise in creatinine concerning for cardiorenal syndrome. Initial co-ox 40% suggested low CO, milrinone started and increased to 0.375.  He was begun on Lasix gtt at 12 mg/hr.  RHC showed R>L heart failure.  Cardiac output looked good on milrinone by Fick but was low by thermodilution (discordant values).  Today, co-ox 71% with CVP 3.   - Continue milrinone 0.375 mcg/kg/min.  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   - Can hold off on diuretic today.    - Continue digoxin.  - Continue Bidil 1 tab tid.  - Continue spironolactone 25 mg daily.   - Decreased Toprol XL to 25 mg daily with  volume overload and low output (also with low HR at times).  - Will need to start thinking about LVAD as an endpoint for him.  With fall in CVP to 3 and improving renal function, I suspect his RV would support LVAD.  However, he also has cirrhosis which will be a consideration.  INR today was 1.2 and albumen 3 so probably not severe liver dysfunction. LVAD workup started.  He is from [facility] but would stay with his daughter in [place]. 10 x 10 subtle density noted in pancreas, will get dedicated MRI to make sure malignancy not present.  2. AKI on CKD stage 3: Suspect this may be a combination of cardiorenal syndrome and contrast nephropathy (had CTA chest at admission).  Creatinine lower today with milrinone support.  - Avoid further contrast.  - Continue to support CO with milrinone.  3. Cirrhosis: Suspect due to HCV, this has been treated with Harvoni.  He was drinking moderate ETOH as well (not heavily) but has quit completely for > 1 month. INR 1.2, albumen 3. NH3 was normal.   - Will send HCV viral load to make sure treatment was effective.  4. COPD/smoking: He recently quit smoking.  CT chest with moderate emphysema.  - Pending PFTs.  5. Confusion:  Completely resolved.  Had overnight 10/14-15.  NH3 was normal, doubt hepatic encephalopathy.  No recent ETOH, not due to ETOH withdrawal.  Think may have been due to low output and hospitalization.  CT head unremarkable.  6. Hyponatremia: Need to fluid restrict, 1500 cc.  7. Aortic insufficiency: ?Moderate range.   Length of Stay: 4  Loralie Champagne, MD  11/08/2018, 8:38 AM  Advanced Heart Failure Team Pager [phone] (M-F; 7a - 4p)  Please contact [facility] for night-coverage after hours (4p -7a ) and weekends on [email or web address]

## 2018-11-09 ENCOUNTER — Inpatient Hospital Stay (HOSPITAL_COMMUNITY): Payer: 59

## 2018-11-09 DIAGNOSIS — I5023 Acute on chronic systolic (congestive) heart failure: Secondary | ICD-10-CM | POA: Diagnosis not present

## 2018-11-09 DIAGNOSIS — E43 Unspecified severe protein-calorie malnutrition: Secondary | ICD-10-CM

## 2018-11-09 LAB — CBC WITH DIFFERENTIAL/PLATELET
Abs Immature Granulocytes: 0.01 10*3/uL (ref 0.00–0.07)
Basophils Absolute: 0 10*3/uL (ref 0.0–0.1)
Basophils Relative: 0 %
Eosinophils Absolute: 0 10*3/uL (ref 0.0–0.5)
Eosinophils Relative: 1 %
HCT: 49 % (ref 39.0–52.0)
Hemoglobin: 17.2 g/dL — ABNORMAL HIGH (ref 13.0–17.0)
Immature Granulocytes: 0 %
Lymphocytes Relative: 30 %
Lymphs Abs: 1.6 10*3/uL (ref 0.7–4.0)
MCH: 30.4 pg (ref 26.0–34.0)
MCHC: 35.1 g/dL (ref 30.0–36.0)
MCV: 86.7 fL (ref 80.0–100.0)
Monocytes Absolute: 1 10*3/uL (ref 0.1–1.0)
Monocytes Relative: 18 %
Neutro Abs: 2.7 10*3/uL (ref 1.7–7.7)
Neutrophils Relative %: 51 %
Platelets: 222 10*3/uL (ref 150–400)
RBC: 5.65 MIL/uL (ref 4.22–5.81)
RDW: 15.5 % (ref 11.5–15.5)
WBC: 5.3 10*3/uL (ref 4.0–10.5)
nRBC: 0 % (ref 0.0–0.2)

## 2018-11-09 LAB — COMPREHENSIVE METABOLIC PANEL
ALT: 50 U/L — ABNORMAL HIGH (ref 0–44)
AST: 93 U/L — ABNORMAL HIGH (ref 15–41)
Albumin: 3.2 g/dL — ABNORMAL LOW (ref 3.5–5.0)
Alkaline Phosphatase: 74 U/L (ref 38–126)
Anion gap: 12 (ref 5–15)
BUN: 29 mg/dL — ABNORMAL HIGH (ref 8–23)
CO2: 31 mmol/L (ref 22–32)
Calcium: 9.4 mg/dL (ref 8.9–10.3)
Chloride: 86 mmol/L — ABNORMAL LOW (ref 98–111)
Creatinine, Ser: 1.33 mg/dL — ABNORMAL HIGH (ref 0.61–1.24)
GFR calc Af Amer: >60 mL/min (ref >60)
GFR calc non Af Amer: 54 mL/min — ABNORMAL LOW (ref >60)
Glucose, Bld: 104 mg/dL — ABNORMAL HIGH (ref 70–99)
Potassium: 4.3 mmol/L (ref 3.5–5.1)
Sodium: 129 mmol/L — ABNORMAL LOW (ref 135–145)
Total Bilirubin: 1.3 mg/dL — ABNORMAL HIGH (ref 0.3–1.2)
Total Protein: 8.2 g/dL — ABNORMAL HIGH (ref 6.5–8.1)

## 2018-11-09 LAB — COOXEMETRY PANEL
Carboxyhemoglobin: 0.7 % (ref 0.5–1.5)
Methemoglobin: 0.5 % (ref 0.0–1.5)
O2 Saturation: 67.4 %
Total hemoglobin: 17.1 g/dL — ABNORMAL HIGH (ref 12.0–16.0)

## 2018-11-09 LAB — HCV RNA QUANT: HCV Quantitative: NOT DETECTED IU/mL (ref >50)

## 2018-11-09 MED ORDER — SALINE SPRAY 0.65 % NA SOLN
1.0000 | NASAL | Status: DC | PRN
  Filled 2018-11-09: qty 44

## 2018-11-09 NOTE — Progress Notes (Signed)
Pt ambulated in a hallway twice this afternoon. Denies CP, SOB and distress. Using oxygen on and off, oxygen mainly drop when he takes nap, goes down to 85%, and oxygen needs to restarted @ 2l, but when patient is up and moving his saturation is 90-100%. Milrinone continue @ 0.345mcg/min Vitals stable except once he had wide QRS of 7 beats @1411  while he was sleeping but he was asymptomatic on checking, wife is in bed side and is updated, will continue to monitor the patient  Palma Holter, RN

## 2018-11-09 NOTE — Progress Notes (Signed)
Patient ID: Keith Nguyen., male   DOB: 09/30/1950, 68 y.o.   MRN: [id number]     Advanced Heart Failure Rounding Note  PCP-Cardiologist: No primary care provider on file.   Subjective:    He is doing well symptomatically.  Walking without dyspnea.  Continues to require supplemental oxygen (was not on at home). CVP 3, co-ox 67% this morning.  Creatinine down to 1.33.   CT chest with moderate emphysema, 4.5 cm ascending aorta.   CT abdomen with cirrhosis, ?gallstone, subtle 10 x 10 mm lesion head/neck pancreas.   MRI abdomen pancreas protocol: No pancreatic lesion, gallstones in gallbladder without cholecystitis.   Limited echo with Definity was reviewed, I do not see LV thrombus.   RHC Procedural Findings (milrinone 0.375): Hemodynamics (mmHg) RA mean 14 RV 46/13 PA 49/20, 31 PCWP mean 16 Oxygen saturations: PA 70% AO 93% Cardiac Output (Fick) 5.88  Cardiac Index (Fick) 3.06 PVR 2.55 PAPI 2.07 CVP/PCWP 0.875 Cardiac Output (Thermo) 3.16 Cardiac Index (Thermo) 1.64   Objective:   Weight Range: 65.1 kg Body mass index is 20.02 kg/m.   Vital Signs:   Temp:  [97.3 F (36.3 C)-98.1 F (36.7 C)] 98 F (36.7 C) (10/18 0300) Pulse Rate:  [81-98] 98 (10/18 0300) Resp:  [12-32] 17 (10/18 0300) BP: (100-124)/(75-91) 121/85 (10/18 0300) SpO2:  [95 %-100 %] 95 % (10/18 0300) Weight:  [65.1 kg] 65.1 kg (10/18 0300) Last BM Date: 11/05/18  Weight change: Filed Weights   11/07/18 0445 11/08/18 0355 11/09/18 0300  Weight: 63.9 kg 65.3 kg 65.1 kg    Intake/Output:   Intake/Output Summary (Last 24 hours) at 11/09/2018 0709 Last data filed at 11/09/2018 0600 Gross per 24 hour  Intake 880.78 ml  Output 1800 ml  Net -919.22 ml      Physical Exam    General: NAD Neck: No JVD, no thyromegaly or thyroid nodule.  Lungs: Clear to auscultation bilaterally with normal respiratory effort. CV: Lateral PMI.  Heart regular S1/S2, no S3/S4, no murmur.  No peripheral edema.   No carotid bruit.  Normal pedal pulses.  Abdomen: Soft, nontender, no hepatosplenomegaly, no distention.  Skin: Intact without lesions or rashes.  Neurologic: Alert and oriented x 3.  Psych: Normal affect. Extremities: No clubbing or cyanosis.  HEENT: Normal.    Telemetry   NSR 70s (personally reviewed)  Labs    CBC Recent Labs    11/08/18 0415 11/09/18 0410  WBC 5.4 5.3  NEUTROABS 2.8 2.7  HGB 16.3 17.2*  HCT 48.3 49.0  MCV 86.7 86.7  PLT 216 [id number]   Basic Metabolic Panel Recent Labs    11/08/18 0415 11/08/18 0757 11/09/18 0410  NA 124*  --  129*  K 3.7  --  4.3  CL 79*  --  86*  CO2 32  --  31  GLUCOSE 124*  --  104*  BUN 32*  --  29*  CREATININE 1.42*  --  1.33*  CALCIUM 9.3  --  9.4  MG 19.7* 2.1  --    Liver Function Tests Recent Labs    11/08/18 0415 11/09/18 0410  AST 104* 93*  ALT 57* 50*  ALKPHOS 71 74  BILITOT 1.4* 1.3*  PROT 7.4 8.2*  ALBUMIN 3.0* 3.2*   Recent Labs    11/07/18 1645  LIPASE 35  AMYLASE 176*   Cardiac Enzymes No results for input(s): CKTOTAL, CKMB, CKMBINDEX, TROPONINI in the last 72 hours.  BNP: BNP (last 3 results) Recent Labs  10/27/2018 2019  BNP >4,500.0*    ProBNP (last 3 results) No results for input(s): PROBNP in the last 8760 hours.   D-Dimer No results for input(s): DDIMER in the last 72 hours. Hemoglobin A1C Recent Labs    11/07/18 1645  HGBA1C 6.6*   Fasting Lipid Panel Recent Labs    11/07/18 1645  CHOL 200  HDL 36*  LDLCALC 147*  TRIG 86  CHOLHDL 5.6   Thyroid Function Tests No results for input(s): TSH, T4TOTAL, T3FREE, THYROIDAB in the last 72 hours.  Invalid input(s): FREET3  Other results:   Imaging    Mr Abdomen W Wo Contrast  Result Date: 11/08/2018 CLINICAL DATA:  68 year old male with potential mass in the pancreas noted on prior CT examination. EXAM: MRI ABDOMEN WITHOUT AND WITH CONTRAST TECHNIQUE: Multiplanar multisequence MR imaging of the abdomen was performed  both before and after the administration of intravenous contrast. CONTRAST:  40mL GADAVIST GADOBUTROL 1 MMOL/ML IV SOLN COMPARISON:  None. FINDINGS: Lower chest: Cardiomegaly. Hepatobiliary: No suspicious cystic or solid hepatic lesions. No intra or extrahepatic biliary ductal dilatation. Multiple filling defects in the gallbladder, compatible with gallstones, largest of which measures up to 2.2 cm in diameter. Gallbladder is nearly decompressed, without surrounding inflammatory changes or pericholecystic fluid. Pancreas: No pancreatic mass. No pancreatic ductal dilatation. No pancreatic or peripancreatic fluid collections or inflammatory changes. Spleen:  Unremarkable. Adrenals/Urinary Tract: In the anterior aspect of the interpolar region of the left kidney (axial image 25 of series 5) there is a 7 mm lesion that is mildly T1 hyperintense, mildly T2 hyperintense, and does not enhance, compatible with a mildly proteinaceous cyst. Right kidney and bilateral adrenal glands are normal in appearance. No hydroureteronephrosis in the visualized portions of the abdomen. Stomach/Bowel: Scattered colonic diverticulae are noted in the descending colon. Vascular/Lymphatic: Aortic atherosclerosis, without evidence of aneurysm in the abdominal vasculature. No lymphadenopathy noted in the abdomen. Other: No significant volume of ascites noted in the visualized portions of the peritoneal cavity. Musculoskeletal: No aggressive appearing osseous lesions are noted in the visualized portions of the skeleton. IMPRESSION: 1. No pancreatic lesion to account for the perceived abnormality on the recent CT examination. 2. Cholelithiasis without evidence of acute cholecystitis at this time. 3. Additional incidental findings, as above. Electronically Signed   By: Vinnie Langton M.D.   On: 11/08/2018 17:57     Medications:     Scheduled Medications: . allopurinol  100 mg Oral Daily  . aspirin EC  81 mg Oral Daily  . Chlorhexidine  Gluconate Cloth  6 each Topical Daily  . digoxin  0.125 mg Oral Daily  . feeding supplement (ENSURE ENLIVE)  237 mL Oral BID BM  . heparin injection (subcutaneous)  5,000 Units Subcutaneous Q8H  . isosorbide-hydrALAZINE  1 tablet Oral TID  . loratadine  10 mg Oral Daily  . mouth rinse  15 mL Mouth Rinse BID  . metoprolol succinate  25 mg Oral Daily  . potassium chloride  10 mEq Oral Daily  . sodium chloride flush  10-40 mL Intracatheter Q12H  . sodium chloride flush  3 mL Intravenous Q12H  . spironolactone  25 mg Oral Daily    Infusions: . milrinone 0.375 mcg/kg/min (11/08/18 1903)    PRN Medications: acetaminophen **OR** acetaminophen, albuterol, methocarbamol, nitroGLYCERIN, ondansetron **OR** ondansetron (ZOFRAN) IV, polyethylene glycol, sodium chloride flush   Assessment/Plan   1. Acute on chronic systolic CHF: Nonischemic cardiomyopathy diagnosed in 2017 with echo showing EF 40% and LHC showing mild  nonobstructive CAD.  He saw Dr. Wynonia Lawman in the past, but no cardiology evaluation since 2017.  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  He has drunk moderate ETOH in the past (no longer drinks) but does not seem to have drunk enough to cause a cardiomyopathy.  He was markedly volume overloaded on exam initially with biventricular failure at admission with rise in creatinine concerning for cardiorenal syndrome. Initial co-ox 40% suggested low CO, milrinone started and increased to 0.375.  He was begun on Lasix gtt at 12 mg/hr.  RHC showed R>L heart failure.  Cardiac output looked good on milrinone by Fick but was low by thermodilution (discordant values).  Today, co-ox 67% with CVP 3.   - Continue milrinone 0.375 mcg/kg/min.  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   - Can hold off on diuretic today as CVP remains low.     -  Continue digoxin.  - Continue Bidil 1 tab tid.  - Continue spironolactone 25 mg daily.   - Decreased Toprol XL to 25 mg daily with volume overload and low output (also with low HR at times).  - Will need to start thinking about LVAD as an endpoint for him.  With fall in CVP to 3 and improving renal function, I suspect his RV would support LVAD.  However, he also has cirrhosis which will be a consideration.  INR was 1.2 and albumen 3.2 so probably not severe liver dysfunction. LVAD workup started.  He is from [facility] but would stay with his daughter in [place]. There was question of pancreatic lesion but pancreatic protocol MRI showed no lesion.  Now that creatinine improved, will aim to inject coronaries this week to make sure severe CAD does not account for fall in EF (nonobstructive CAD 2017 but EF higher).  2. AKI on CKD stage 3: Suspect this may be a combination of cardiorenal syndrome and contrast nephropathy (had CTA chest at admission).  Creatinine lower today with milrinone support.  - Continue to support CO with milrinone.  3. Cirrhosis: Suspect due to HCV, this has been treated with Harvoni.  He was drinking moderate ETOH as well (not heavily) but has quit completely for > 1 month. INR 1.2, albumen 3.2. NH3 was normal.   - Will send HCV viral load to make sure treatment was effective.  4. COPD/smoking: He recently quit smoking.  CT chest with moderate emphysema. He does appear to have an oxygen requirement that may be due to COPD as CVP now low.  - Pending PFTs.  5. Confusion:  Completely resolved.  Had overnight 10/14-15.  NH3 was normal, doubt hepatic encephalopathy.  No recent ETOH, not due to ETOH withdrawal.  Think may have been due to low output and hospitalization.  CT head unremarkable.  6. Hyponatremia: Need to fluid restrict, 1500 cc.  7. Aortic insufficiency: ?Moderate range.   Length of Stay: [place], MD  11/09/2018, 7:09 AM  Advanced Heart Failure Team Pager  [phone] (M-F; 7a - 4p)  Please contact [facility] for night-coverage after hours (4p -7a ) and weekends on [email or web address]

## 2018-11-09 NOTE — Progress Notes (Signed)
PROGRESS NOTE    Keith Nguyen.  BN:[id number] DOB: Mar 05, 1950 DOA: 11/12/2018 PCP: [name], MD      Brief Narrative:  Keith Nguyen is a 68 y.o. M with hx hep C s/p [place], smoking recently quit, and HTN who presented with subacute progressive DOE.  In the ER, CTA was negative for PE but showed bilateral groundglass opacities and small pleural effusions.  BNP >4000, hsTrop low and flat.  Given Lasix and admitted for CHF.       Assessment & Plan:  Acute on chronic systolic CHF SvO2 [id number], CVP low.  Dyspnea on exertion is improved, he is off oxygen this afternoon at rest (does not use oxygen at home).  Improved/stable. -Continue milrinone per CHF team -Hold Lasix per CHF team -Continue digoxin, spironolactone, lower metoprolol, continue BiDil per CHF team -Consult palliative as part of LVAD work-up -PFTs as part of LVAD work-up -Angiography pending this week as part of LVAD work-up -Continue aspirin -Hold home losartan until recommended by Cardiology   Hypertension BP controlled -Continue metoprolol, spironolactone  AKI on CKD stage III Had had cardiorenal syndrome.  Cr stable at 1.3, improved on milrinone  Cirrhosis, hep C related INR, albumin, synthetic function appears intact.  No history HE, ascites. -Follow Hep C RNA quant to confirm Hep C clearance as part of LVAD work up  Smoking history No prior history "COPD", no wheezing to suggest active bronchospasm. -PFTs tomorrow  Confusion This was transient, overnight, suspected from low cardiac output, maybe medications. Resolved. -Avoid sedating medications  Hyponatremia Stable, slightly improved  Other medications -Continue allopurinol  Severe protein calorie malnutrition Due to chronic heart failure, noted severe loss of subcutaneous muscle mass and fat -Nutrition consult -Continue Ensure      MDM and disposition: The below labs and imaging reports were reviewed and summarized above.   Medication management as above.  The patient was admitted with acute CHF.    He was found to have low cardiac output, required milrinone, and Lasix gtt.  He is now euvolemic, and cardiology have stopped his Lasix.  They will continue milrinone.    The patient has severe heart failure requiring ongoing milrinone/inotrope infusion.  Cardiology will continue LVAD workup.         DVT prophylaxis: Heparin Code Status: FULL Family Communication: Wife    Consultants:   CHF  Procedures:   10/13 echo - EF 20-25%  10/14 RHC - R > L heart failure, CO discordant by Fick/thermodil -- milrinone continued    Subjective: No orthopnea, leg swelling, dyspnea on exertion, fever, headache, vomiting, abdominal pain, fullness, paroxysmal nocturnal dyspnea.  Objective: Vitals:   11/08/18 2322 11/09/18 0300 11/09/18 0713 11/09/18 1216  BP: (!) 124/91 121/85 108/82 120/87  Pulse: 88 98 97 86  Resp: (!) 32 17 15 18   Temp: (!) 97.3 F (36.3 C) 98 F (36.7 C) 98.1 F (36.7 C) 98.3 F (36.8 C)  TempSrc: Oral Oral Oral Oral  SpO2: 95% 95% 100% 95%  Weight:  65.1 kg    Height:        Intake/Output Summary (Last 24 hours) at 11/09/2018 1408 Last data filed at 11/09/2018 0800 Gross per 24 hour  Intake 640.78 ml  Output 1500 ml  Net -859.22 ml   Filed Weights   11/07/18 0445 11/08/18 0355 11/09/18 0300  Weight: 63.9 kg 65.3 kg 65.1 kg    Examination: General appearance: Thin adult male, alert and in no acute distress.  Interactive, watching football, napping.  HEENT: Anicteric, conjunctiva pink, lids and lashes normal. No nasal deformity, discharge, epistaxis.  Lips moist, dentition good, oropharynx moist, no oral lesions, hearing normal.   Skin: Warm and dry.  No suspicious rashes or lesions. Cardiac: Tachycardic, regular, nl S1-S2, no murmurs appreciated.  Capillary refill is brisk.  JVP normal.  No LE edema.  Radial pulses 2+ and symmetric. Respiratory: Normal respiratory rate  and rhythm.  CTAB without rales or wheezes.  Prolonged expiratory phase. Abdomen: Abdomen soft.  No TTP or guarding. No ascites, distension, hepatosplenomegaly.   MSK: No deformities or effusions.  Severely decreased subcutaneous muscle mass and fat  Neuro: Awake and alert.  EOMI, moves all extremities. Speech fluent.    Psych: Sensorium intact and responding to questions, attention normal. Affect normal.  Judgment and insight appear normal.    Data Reviewed: I have personally reviewed following labs and imaging studies:  CBC: Recent Labs  Lab 11/05/18 0448 11/19/2018 0302 11/16/2018 0816 11/03/2018 0817 11/07/18 0420 11/08/18 0415 11/09/18 0410  WBC 5.1 4.6  --   --  5.0 5.4 5.3  NEUTROABS 3.1 2.8  --   --  3.0 2.8 2.7  HGB 15.0 13.9 15.6 15.6 16.1 16.3 17.2*  HCT 47.4 43.1 46.0 46.0 47.7 48.3 49.0  MCV 92.9 90.9  --   --  87.4 86.7 86.7  PLT 208 227  --   --  220 216 [id number]   Basic Metabolic Panel: Recent Labs  Lab 11/05/18 0448 11/10/2018 0302 11/15/2018 0816 10/31/2018 0817 11/05/2018 1807 11/07/18 0420 11/08/18 0415 11/08/18 0757 11/09/18 0410  NA 137 134* 138 139  --  130* 124*  --  129*  K 5.4* 3.8 3.4* 3.4*  --  3.3* 3.7  --  4.3  CL 104 95*  --   --   --  86* 79*  --  86*  CO2 18* 25  --   --   --  31 32  --  31  GLUCOSE 91 177*  --   --   --  115* 124*  --  104*  BUN 32* 37*  --   --   --  31* 32*  --  29*  CREATININE 2.10* 1.95*  --   --   --  1.76* 1.42*  --  1.33*  CALCIUM 9.7 9.5  --   --   --  9.5 9.3  --  9.4  MG  --   --   --   --  1.8  --  19.7* 2.1  --    GFR: Estimated Creatinine Clearance: 48.3 mL/min (A) (by C-G formula based on SCr of 1.33 mg/dL (H)). Liver Function Tests: Recent Labs  Lab 11/04/18 0611 11/08/18 0415 11/09/18 0410  AST 46* 104* 93*  ALT 15 57* 50*  ALKPHOS 67 71 74  BILITOT 1.8* 1.4* 1.3*  PROT 8.5* 7.4 8.2*  ALBUMIN 3.8 3.0* 3.2*   Recent Labs  Lab 11/07/18 1645  LIPASE 35  AMYLASE 176*   Recent Labs  Lab 10/27/2018 0829    AMMONIA 30   Coagulation Profile: Recent Labs  Lab 11/21/2018 1056 11/08/18 0415  INR 1.7* 1.2   Cardiac Enzymes: No results for input(s): CKTOTAL, CKMB, CKMBINDEX, TROPONINI in the last 168 hours. BNP (last 3 results) No results for input(s): PROBNP in the last 8760 hours. HbA1C: Recent Labs    11/07/18 1645  HGBA1C 6.6*   CBG: Recent Labs  Lab 11/05/18 2006  GLUCAP 123*   Lipid Profile:  Recent Labs    11/07/18 1645  CHOL 200  HDL 36*  LDLCALC 147*  TRIG 86  CHOLHDL 5.6   Thyroid Function Tests: Recent Labs    11/07/18 1645  FREET4 1.04   Anemia Panel: No results for input(s): VITAMINB12, FOLATE, FERRITIN, TIBC, IRON, RETICCTPCT in the last 72 hours. Urine analysis:    Component Value Date/Time   COLORURINE YELLOW 11/04/2018 1745   APPEARANCEUR HAZY (A) 11/04/2018 1745   LABSPEC 1.028 11/04/2018 1745   PHURINE 5.0 11/04/2018 1745   [place] 11/04/2018 1745   HGBUR NEGATIVE 11/04/2018 1745   [place] 11/04/2018 [place] 11/04/2018 1745   PROTEINUR 30 (A) 11/04/2018 1745   NITRITE NEGATIVE 11/04/2018 1745   LEUKOCYTESUR NEGATIVE 11/04/2018 1745   Sepsis Labs: @LABRCNTIP (procalcitonin:4,lacticacidven:4)  ) Recent Results (from the past 240 hour(s))  SARS CORONAVIRUS 2 (TAT 6-24 HRS) Nasopharyngeal Nasopharyngeal Swab     Status: None   Collection Time: 11/04/18 12:29 AM   Specimen: Nasopharyngeal Swab  Result Value Ref Range Status   SARS Coronavirus 2 NEGATIVE NEGATIVE Final    Comment: (NOTE) SARS-CoV-2 target nucleic acids are NOT DETECTED. The SARS-CoV-2 RNA is generally detectable in upper and lower respiratory specimens during the acute phase of infection. Negative results do not preclude SARS-CoV-2 infection, do not rule out co-infections with other pathogens, and should not be used as the sole basis for treatment or other patient management decisions. Negative results must be combined with clinical  observations, patient history, and epidemiological information. The expected result is Negative. Fact Sheet for Patients: SugarRoll.be Fact Sheet for Healthcare Providers: https://www.woods-mathews.com/ This test is not yet approved or cleared by the [place] FDA and  has been authorized for detection and/or diagnosis of SARS-CoV-2 by FDA under an Emergency Use Authorization (EUA). This EUA will remain  in effect (meaning this test can be used) for the duration of the COVID-19 declaration under Section 56 4(b)(1) of the Act, 21 U.S.C. section 360bbb-3(b)(1), unless the authorization is terminated or revoked sooner. Performed at [facility], [place] [address] [place], [place] [address]   MRSA PCR Screening     Status: None   Collection Time: 11/05/18  2:15 PM   Specimen: Nasal Mucosa; Nasopharyngeal  Result Value Ref Range Status   MRSA by PCR NEGATIVE NEGATIVE Final    Comment:        The GeneXpert MRSA Assay (FDA approved for NASAL specimens only), is one component of a comprehensive MRSA colonization surveillance program. It is not intended to diagnose MRSA infection nor to guide or monitor treatment for MRSA infections. Performed at [facility], [place] [address], [place], [place] [address]          Radiology Studies: Ct Abdomen Pelvis Wo Contrast  Result Date: 11/07/2018 CLINICAL DATA:  Assessment for ventricular assist device. EXAM: CT CHEST, ABDOMEN AND PELVIS WITHOUT CONTRAST TECHNIQUE: Multidetector CT imaging of the chest, abdomen and pelvis was performed following the standard protocol without IV contrast. COMPARISON:  CT chest 01/03/2019 FINDINGS: CT CHEST FINDINGS Cardiovascular: Heart size remains enlarged without pericardial effusion. Aortic atherosclerosis is present with tortuosity of the thoracic aorta and mild in your is more caliber of 4.5 cm greatest axial dimension. Right-sided PICC  line in situ terminating at the caval to atrial junction. Central pulmonary arteries are cords. Maximal caliber of main pulmonary artery at 4 cm. Coronary artery calcifications. Mediastinum/Nodes: No signs of adenopathy in the chest. Lungs/Pleura: Diminished right effusion  and resolution of left effusion since prior study. Basilar atelectasis on the right. Airways are patent. Minimal atelectasis on the left. No pulmonary mass or dense consolidation. Paraseptal and centrilobular emphysema paraseptal changes predominate at the lung apices. Changes are moderate. Musculoskeletal: No signs of chest wall mass. CT ABDOMEN PELVIS FINDINGS Hepatobiliary: Nodular hepatic contour. Fissural widening. Decompressed gallbladder, motion limited assessment with cholelithiasis and large calculus likely in the neck of the gallbladder measuring 12 x 7 mm. No signs of biliary ductal dilation at this time. Pancreas: Mild main pancreatic duct distension. Subtle hypodense lesion in the head of the pancreas measures 10 x 10 mm (image 71, series 3) no priors are available for comparison. Spleen: Choose 1 Adrenals/Urinary Tract: Normal adrenal glands. Some renal cortical scarring and contrast suggested within the parenchyma perhaps from recent right heart catheterization. No hydronephrosis. Stomach/Bowel: No signs of acute gastrointestinal process. Vascular/Lymphatic: Marked atherosclerotic changes of the abdominal aorta tracking in the iliac vessels also into branch vessels. 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 No signs of upper abdominal adenopathy. No signs of pelvic adenopathy. Reproductive: Calcifications in the prostate, nonspecific. Urinary bladder unremarkable. Other: No signs of free air. No ascites. Musculoskeletal: Signs of healed rib fracture of right posterior fourth rib. Redemonstration of compression fracture at the T11 vertebral level unchanged. No acute or destructive bone process. IMPRESSION:  1. Subtle hypodense lesion in the head/neck of the pancreas measuring 10 x 10 mm. Suggest pancreatic protocol CT or MRI for further assessment. Focused ultrasound assessment could also be performed at the time of gallbladder evaluation as warranted as an initial step in evaluation if contrast administration is not possible at this time. 2. Large biliary calculus in the gallbladder neck. Correlate with any recent symptoms of abdominal pain with ultrasound as warranted. Limited assessment of the gallbladder due to motion artifact. 3. Improving appearance of pleural fluid with resolution of left and decreased size of right pleural effusion. 4. Retained contrast in renal parenchyma may be indicative of renal dysfunction, correlate with current lab values. 5. Cardiomegaly. Coronary artery calcifications. 6. Cirrhosis. 7. Marked atherosclerotic changes in the abdominal aorta. 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 Aortic Atherosclerosis (ICD10-I70.0) and Emphysema (ICD10-J43.9). Electronically Signed   By: Zetta Bills M.D.   On: 11/07/2018 17:52   Dg Orthopantogram  Result Date: 11/07/2018 CLINICAL DATA:  Preoperative examination EXAM: ORTHOPANTOGRAM/PANORAMIC COMPARISON:  None. FINDINGS: Single-view orthopantogram demonstrates no fracture or bone lesion. Dentition within normal limits. No periapical lucencies. Scattered dental hardware. IMPRESSION: Single-view orthopantogram within normal limits. Electronically Signed   By: Davina Poke M.D.   On: 11/07/2018 19:48   Ct Chest Wo Contrast  Result Date: 11/07/2018 CLINICAL DATA:  Assessment for ventricular assist device. EXAM: CT CHEST, ABDOMEN AND PELVIS WITHOUT CONTRAST TECHNIQUE: Multidetector CT imaging of the chest, abdomen and pelvis was performed following the standard protocol without IV contrast. COMPARISON:  CT chest 01/03/2019 FINDINGS: CT CHEST FINDINGS Cardiovascular: Heart size remains enlarged without pericardial  effusion. Aortic atherosclerosis is present with tortuosity of the thoracic aorta and mild in your is more caliber of 4.5 cm greatest axial dimension. Right-sided PICC line in situ terminating at the caval to atrial junction. Central pulmonary arteries are cords. Maximal caliber of main pulmonary artery at 4 cm. Coronary artery calcifications. Mediastinum/Nodes: No signs of adenopathy in the chest. Lungs/Pleura: Diminished right effusion and resolution of left effusion since prior study. Basilar atelectasis on the right. Airways are patent.  Minimal atelectasis on the left. No pulmonary mass or dense consolidation. Paraseptal and centrilobular emphysema paraseptal changes predominate at the lung apices. Changes are moderate. Musculoskeletal: No signs of chest wall mass. CT ABDOMEN PELVIS FINDINGS Hepatobiliary: Nodular hepatic contour. Fissural widening. Decompressed gallbladder, motion limited assessment with cholelithiasis and large calculus likely in the neck of the gallbladder measuring 12 x 7 mm. No signs of biliary ductal dilation at this time. Pancreas: Mild main pancreatic duct distension. Subtle hypodense lesion in the head of the pancreas measures 10 x 10 mm (image 71, series 3) no priors are available for comparison. Spleen: Choose 1 Adrenals/Urinary Tract: Normal adrenal glands. Some renal cortical scarring and contrast suggested within the parenchyma perhaps from recent right heart catheterization. No hydronephrosis. Stomach/Bowel: No signs of acute gastrointestinal process. Vascular/Lymphatic: Marked atherosclerotic changes of the abdominal aorta tracking in the iliac vessels also into branch vessels. 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 No signs of upper abdominal adenopathy. No signs of pelvic adenopathy. Reproductive: Calcifications in the prostate, nonspecific. Urinary bladder unremarkable. Other: No signs of free air. No ascites. Musculoskeletal: Signs of healed rib  fracture of right posterior fourth rib. Redemonstration of compression fracture at the T11 vertebral level unchanged. No acute or destructive bone process. IMPRESSION: 1. Subtle hypodense lesion in the head/neck of the pancreas measuring 10 x 10 mm. Suggest pancreatic protocol CT or MRI for further assessment. Focused ultrasound assessment could also be performed at the time of gallbladder evaluation as warranted as an initial step in evaluation if contrast administration is not possible at this time. 2. Large biliary calculus in the gallbladder neck. Correlate with any recent symptoms of abdominal pain with ultrasound as warranted. Limited assessment of the gallbladder due to motion artifact. 3. Improving appearance of pleural fluid with resolution of left and decreased size of right pleural effusion. 4. Retained contrast in renal parenchyma may be indicative of renal dysfunction, correlate with current lab values. 5. Cardiomegaly. Coronary artery calcifications. 6. Cirrhosis. 7. Marked atherosclerotic changes in the abdominal aorta. 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 Aortic Atherosclerosis (ICD10-I70.0) and Emphysema (ICD10-J43.9). Electronically Signed   By: Zetta Bills M.D.   On: 11/07/2018 17:52   Mr Abdomen W Wo Contrast  Result Date: 11/08/2018 CLINICAL DATA:  68 year old male with potential mass in the pancreas noted on prior CT examination. EXAM: MRI ABDOMEN WITHOUT AND WITH CONTRAST TECHNIQUE: Multiplanar multisequence MR imaging of the abdomen was performed both before and after the administration of intravenous contrast. CONTRAST:  64mL GADAVIST GADOBUTROL 1 MMOL/ML IV SOLN COMPARISON:  None. FINDINGS: Lower chest: Cardiomegaly. Hepatobiliary: No suspicious cystic or solid hepatic lesions. No intra or extrahepatic biliary ductal dilatation. Multiple filling defects in the gallbladder, compatible with gallstones, largest of which measures up to 2.2 cm in diameter.  Gallbladder is nearly decompressed, without surrounding inflammatory changes or pericholecystic fluid. Pancreas: No pancreatic mass. No pancreatic ductal dilatation. No pancreatic or peripancreatic fluid collections or inflammatory changes. Spleen:  Unremarkable. Adrenals/Urinary Tract: In the anterior aspect of the interpolar region of the left kidney (axial image 25 of series 5) there is a 7 mm lesion that is mildly T1 hyperintense, mildly T2 hyperintense, and does not enhance, compatible with a mildly proteinaceous cyst. Right kidney and bilateral adrenal glands are normal in appearance. No hydroureteronephrosis in the visualized portions of the abdomen. Stomach/Bowel: Scattered colonic diverticulae are noted in the descending colon. Vascular/Lymphatic: Aortic atherosclerosis, without evidence of aneurysm in  the abdominal vasculature. No lymphadenopathy noted in the abdomen. Other: No significant volume of ascites noted in the visualized portions of the peritoneal cavity. Musculoskeletal: No aggressive appearing osseous lesions are noted in the visualized portions of the skeleton. IMPRESSION: 1. No pancreatic lesion to account for the perceived abnormality on the recent CT examination. 2. Cholelithiasis without evidence of acute cholecystitis at this time. 3. Additional incidental findings, as above. Electronically Signed   By: Vinnie Langton M.D.   On: 11/08/2018 17:57   US Liver Doppler  Result Date: 11/08/2018 CLINICAL DATA:  Concern for cirrhosis. EXAM: DUPLEX ULTRASOUND OF LIVER TECHNIQUE: Color and duplex Doppler ultrasound was performed to evaluate the hepatic in-flow and out-flow vessels. COMPARISON:  CT abdomen pelvis-12/08/2018 FINDINGS: Liver: There is mild diffuse increased slightly coarsened echogenicity of the hepatic parenchyma. This finding is associated with nodularity of the hepatic contour (representative image 2). No discrete hepatic lesions. No intrahepatic biliary ductal dilatation.  No ascites. Gallbladder: Suboptimally distended and evaluated. CBD: Normal in size measures 0.3 cm in diameter. _________________________________________________________ Main Portal Vein size: 1.1 cm Portal Vein Velocities Main Prox:  20 cm/sec Main Mid: 16 cm/sec Main Dist:  19 cm/sec Right: 16 cm/sec Left: 18 cm/sec Hepatic Vein Velocities Right:  39 cm/sec Middle:  40 cm/sec Left:  22 cm/sec IVC: Present and patent with normal respiratory phasicity. Hepatic Artery Velocity:  36 cm/sec Splenic Vein Velocity:  19 cm/sec Spleen: 11.1 cm x 8.7 cm x 4.0 cm with a total volume of 203 cm^3 (411 cm^3 is upper limit normal) Portal Vein Occlusion/Thrombus: No Splenic Vein Occlusion/Thrombus: No Ascites: None Varices: None IMPRESSION: 1. Coarsened hepatic echogenicity with nodularity hepatic contour suggestive of hepatic cirrhosis. No discrete hepatic lesions. Further evaluation could be performed with contrast-enhanced MRI or CT as clinically indicated. 2. Hepatic vasculature is patent with normal directional flow. 3. No definitive evidence of portal venous hypertension. Specifically, no evidence of splenomegaly or intra-abdominal ascites. Electronically Signed   By: Sandi Mariscal M.D.   On: 11/08/2018 04:15   Vas US Doppler Pre Vad  Result Date: 11/09/2018 PERIOPERATIVE VASCULAR EVALUATION Risk Factors:     Hyperlipidemia, current smoker. Other Factors:    Pre-op VAD,. Comparison Study: No prior study on file Performing Technologist: Carlos Levering Rvt  Examination Guidelines: A complete evaluation includes B-mode imaging, spectral Doppler, color Doppler, and power Doppler as needed of all accessible portions of each vessel. Bilateral testing is considered an integral part of a complete examination. Limited examinations for reoccurring indications may be performed as noted.  Right Carotid Findings: +----------+--------+--------+--------+------------+------------------+             PSV cm/s EDV cm/s Stenosis Describe      Comments            +----------+--------+--------+--------+------------+------------------+  CCA Prox   57       7                              intimal thickening  +----------+--------+--------+--------+------------+------------------+  CCA Distal 56       10                             intimal thickening  +----------+--------+--------+--------+------------+------------------+  ICA Prox   25       8                 heterogenous                     +----------+--------+--------+--------+------------+------------------+  ICA Distal 42       14                                                 +----------+--------+--------+--------+------------+------------------+  ECA        53       2                                                  +----------+--------+--------+--------+------------+------------------+ Portions of this table do not appear on this page. +----------+--------+-------+--------+------------+             PSV cm/s EDV cms Describe Arm Pressure  +----------+--------+-------+--------+------------+  Subclavian 57                                      +----------+--------+-------+--------+------------+ +---------+--------+--+--------+-+  Vertebral PSV cm/s 44 EDV cm/s 9  +---------+--------+--+--------+-+ Left Carotid Findings: +----------+--------+--------+--------+------------+------------------+             PSV cm/s EDV cm/s Stenosis Describe     Comments            +----------+--------+--------+--------+------------+------------------+  CCA Prox   97       7                              intimal thickening  +----------+--------+--------+--------+------------+------------------+  CCA Distal 58       11                             intimal thickening  +----------+--------+--------+--------+------------+------------------+  ICA Prox   33       10                heterogenous                     +----------+--------+--------+--------+------------+------------------+  ICA Distal 39       15                                                  +----------+--------+--------+--------+------------+------------------+  ECA        101      9                                                  +----------+--------+--------+--------+------------+------------------+ +----------+--------+--------+--------+------------+  Subclavian PSV cm/s EDV cm/s Describe Arm Pressure  +----------+--------+--------+--------+------------+             59                         91            +----------+--------+--------+--------+------------+ +---------+--------+--+--------+-+  Vertebral PSV cm/s 32 EDV cm/s 6  +---------+--------+--+--------+-+  ABI Findings: +--------+------------------+-----+---------+--------+  Right    Rt Pressure (mmHg)  Index Waveform  Comment   +--------+------------------+-----+---------+--------+  Brachial                                    PICC      +--------+------------------+-----+---------+--------+  PTA      100                1.10  triphasic           +--------+------------------+-----+---------+--------+  DP       89                 0.98  triphasic           +--------+------------------+-----+---------+--------+ +--------+------------------+-----+---------+-------+  Left     Lt Pressure (mmHg) Index Waveform  Comment  +--------+------------------+-----+---------+-------+  Brachial 91                                          +--------+------------------+-----+---------+-------+  PTA      113                1.24  triphasic          +--------+------------------+-----+---------+-------+  DP       82                 0.90  triphasic          +--------+------------------+-----+---------+-------+ +-------+---------------+----------------+  ABI/TBI Today's ABI/TBI Previous ABI/TBI  +-------+---------------+----------------+  Right   1.1                               +-------+---------------+----------------+  Left    1.24                              +-------+---------------+----------------+  Summary: Right Carotid: The extracranial  vessels were near-normal with only minimal wall                thickening or plaque. Left Carotid: The extracranial vessels were near-normal with only minimal wall               thickening or plaque. Vertebrals:  Bilateral vertebral arteries demonstrate antegrade flow. Subclavians: Normal flow hemodynamics were seen in bilateral subclavian              arteries.  *See table(s) above for measurements and observations. Right ABI: Resting right ankle-brachial index is within normal range. No evidence of significant right lower extremity arterial disease. Left ABI: Resting left ankle-brachial index is within normal range. No evidence of significant left lower extremity arterial disease.     Preliminary         Scheduled Meds:  allopurinol  100 mg Oral Daily   aspirin EC  81 mg Oral Daily   Chlorhexidine Gluconate Cloth  6 each Topical Daily   digoxin  0.125 mg Oral Daily   feeding supplement (ENSURE ENLIVE)  237 mL Oral BID BM   heparin injection (subcutaneous)  5,000 Units Subcutaneous Q8H   isosorbide-hydrALAZINE  1 tablet Oral TID   loratadine  10 mg Oral Daily   mouth rinse  15 mL Mouth Rinse BID   metoprolol succinate  25 mg Oral Daily   potassium chloride  10 mEq Oral Daily   sodium chloride flush  10-40 mL Intracatheter Q12H   sodium chloride flush  3 mL Intravenous Q12H   spironolactone  25 mg Oral Daily   Continuous Infusions:  milrinone 0.375 mcg/kg/min (11/09/18 1018)     LOS: 5 days    Time spent: 15 minutes    Edwin Dada, MD [facility] 11/09/2018, 2:08 PM     Please page through [place] [facility]:  [email or web address] Password [email or web address] If 7PM-7AM, please contact night-coverage

## 2018-11-10 ENCOUNTER — Inpatient Hospital Stay (HOSPITAL_COMMUNITY): Payer: 59

## 2018-11-10 DIAGNOSIS — I5023 Acute on chronic systolic (congestive) heart failure: Secondary | ICD-10-CM | POA: Diagnosis not present

## 2018-11-10 DIAGNOSIS — B182 Chronic viral hepatitis C: Secondary | ICD-10-CM

## 2018-11-10 DIAGNOSIS — Z66 Do not resuscitate: Secondary | ICD-10-CM

## 2018-11-10 DIAGNOSIS — R5383 Other fatigue: Secondary | ICD-10-CM

## 2018-11-10 DIAGNOSIS — R410 Disorientation, unspecified: Secondary | ICD-10-CM

## 2018-11-10 LAB — PULMONARY FUNCTION TEST
FEF 25-75 Pre: 1.75 L/sec
FEF2575-%Pred-Pre: 66 %
FEV1-%Pred-Pre: 92 %
FEV1-Pre: 2.82 L
FEV1FVC-%Pred-Pre: 90 %
FEV6-%Pred-Pre: 103 %
FEV6-Pre: 3.97 L
FEV6FVC-%Pred-Pre: 101 %
FVC-%Pred-Pre: 101 %
FVC-Pre: 4.08 L
Pre FEV1/FVC ratio: 69 %
Pre FEV6/FVC Ratio: 97 %

## 2018-11-10 LAB — CBC WITH DIFFERENTIAL/PLATELET
Abs Immature Granulocytes: 0.01 10*3/uL (ref 0.00–0.07)
Basophils Absolute: 0 10*3/uL (ref 0.0–0.1)
Basophils Relative: 0 %
Eosinophils Absolute: 0.1 10*3/uL (ref 0.0–0.5)
Eosinophils Relative: 1 %
HCT: 48.5 % (ref 39.0–52.0)
Hemoglobin: 16.4 g/dL (ref 13.0–17.0)
Immature Granulocytes: 0 %
Lymphocytes Relative: 33 %
Lymphs Abs: 1.7 10*3/uL (ref 0.7–4.0)
MCH: 29.5 pg (ref 26.0–34.0)
MCHC: 33.8 g/dL (ref 30.0–36.0)
MCV: 87.4 fL (ref 80.0–100.0)
Monocytes Absolute: 0.8 10*3/uL (ref 0.1–1.0)
Monocytes Relative: 16 %
Neutro Abs: 2.5 10*3/uL (ref 1.7–7.7)
Neutrophils Relative %: 50 %
Platelets: 201 10*3/uL (ref 150–400)
RBC: 5.55 MIL/uL (ref 4.22–5.81)
RDW: 15.5 % (ref 11.5–15.5)
WBC: 5 10*3/uL (ref 4.0–10.5)
nRBC: 0 % (ref 0.0–0.2)

## 2018-11-10 LAB — COMPREHENSIVE METABOLIC PANEL
ALT: 41 U/L (ref 0–44)
AST: 64 U/L — ABNORMAL HIGH (ref 15–41)
Albumin: 3.3 g/dL — ABNORMAL LOW (ref 3.5–5.0)
Alkaline Phosphatase: 74 U/L (ref 38–126)
Anion gap: 11 (ref 5–15)
BUN: 33 mg/dL — ABNORMAL HIGH (ref 8–23)
CO2: 29 mmol/L (ref 22–32)
Calcium: 9.7 mg/dL (ref 8.9–10.3)
Chloride: 89 mmol/L — ABNORMAL LOW (ref 98–111)
Creatinine, Ser: 1.37 mg/dL — ABNORMAL HIGH (ref 0.61–1.24)
GFR calc Af Amer: >60 mL/min (ref >60)
GFR calc non Af Amer: 52 mL/min — ABNORMAL LOW (ref >60)
Glucose, Bld: 125 mg/dL — ABNORMAL HIGH (ref 70–99)
Potassium: 4.6 mmol/L (ref 3.5–5.1)
Sodium: 129 mmol/L — ABNORMAL LOW (ref 135–145)
Total Bilirubin: 1.4 mg/dL — ABNORMAL HIGH (ref 0.3–1.2)
Total Protein: 8.3 g/dL — ABNORMAL HIGH (ref 6.5–8.1)

## 2018-11-10 LAB — SURGICAL PCR SCREEN
MRSA, PCR: NEGATIVE
Staphylococcus aureus: NEGATIVE

## 2018-11-10 LAB — COOXEMETRY PANEL
Carboxyhemoglobin: 1 % (ref 0.5–1.5)
Methemoglobin: 0.5 % (ref 0.0–1.5)
O2 Saturation: 65.6 %
Total hemoglobin: 16.8 g/dL — ABNORMAL HIGH (ref 12.0–16.0)

## 2018-11-10 MED ORDER — SODIUM CHLORIDE 0.9% FLUSH
3.0000 mL | Freq: Two times a day (BID) | INTRAVENOUS | Status: DC
  Administered 2018-11-10 – 2018-11-13 (×3): 3 mL via INTRAVENOUS

## 2018-11-10 MED ORDER — ASPIRIN 81 MG PO CHEW
81.0000 mg | CHEWABLE_TABLET | ORAL | Status: AC
  Administered 2018-11-11: 06:00:00 81 mg via ORAL
  Filled 2018-11-10: qty 1

## 2018-11-10 MED ORDER — SODIUM CHLORIDE 0.9% FLUSH: 3.0000 mL | INTRAVENOUS | Status: DC | PRN

## 2018-11-10 MED ORDER — SODIUM CHLORIDE 0.9 % IV SOLN: 250.0000 mL | INTRAVENOUS | Status: DC | PRN

## 2018-11-10 MED ORDER — ENSURE ENLIVE PO LIQD
237.0000 mL | Freq: Three times a day (TID) | ORAL | Status: DC
  Administered 2018-11-10 – 2018-11-16 (×14): 237 mL via ORAL

## 2018-11-10 MED ORDER — POTASSIUM CHLORIDE 20 MEQ/15ML (10%) PO SOLN: 10.0000 meq | Freq: Once | ORAL | Status: DC

## 2018-11-10 MED ORDER — SODIUM CHLORIDE 0.9 % IV SOLN
INTRAVENOUS | Status: DC
  Administered 2018-11-11: 06:00:00 via INTRAVENOUS

## 2018-11-10 NOTE — Progress Notes (Signed)
Physical Therapy Treatment Patient Details Name: Keith Nguyen. MRN: [id number] DOB: 12-30-1950 Today's Date: 11/10/2018    History of Present Illness Patient is a 68 y/o male who presents with SOB. Found to have Acute on chronic systolic CHF. s/p cath 10/15. Being worked up possibly for an [place]. PMH includes Hep C, HTN, HLD.    PT Comments    Patient progressing well towards PT goals. Tolerated gait training with Min guard assist for balance/safety and holding onto IV pole for support. 2/4 DOE noted with Sp02 >94% on RA throughout. No evidence of imbalance today. Being worked up for LVAD. Instructed pt to walk with family a few times per day to optimize endurance, strength and overall mobility. Pt/family agreeable. Will continue to follow.    Follow Up Recommendations  No PT follow up;Supervision - Intermittent     Equipment Recommendations  None recommended by PT    Recommendations for Other Services       Precautions / Restrictions Precautions Precautions: Fall Restrictions Weight Bearing Restrictions: No    Mobility  Bed Mobility Overal bed mobility: Modified Independent             General bed mobility comments: No assist needed.  Transfers Overall transfer level: Needs assistance Equipment used: None Transfers: Sit to/from Stand Sit to Stand: Supervision         General transfer comment: Supervision for safety. Stood from [employer]. No dizziness.  Ambulation/Gait Ambulation/Gait assistance: Min guard Gait Distance (Feet): 250 Feet Assistive device: IV Pole Gait Pattern/deviations: Step-through pattern;Decreased stride length;Narrow base of support Gait velocity: decreased   General Gait Details: Slow, mostly steady gait holding onto IV pole for support. 2/4 DOE. Sp02 >94% on RA. Narrow boS.   Stairs             Wheelchair Mobility    Modified Rankin (Stroke Patients Only)       Balance Overall balance assessment: Needs  assistance Sitting-balance support: Feet supported;No upper extremity supported Sitting balance-Leahy Scale: Good     Standing balance support: During functional activity Standing balance-Leahy Scale: Fair Standing balance comment: Able to stand statically without UE support; needs UE support for walking.                            Cognition Arousal/Alertness: Awake/alert Behavior During Therapy: WFL for tasks assessed/performed Overall Cognitive Status: Within Functional Limits for tasks assessed                                 General Comments: seems [facility] for basic mobility tasks.      Exercises      General Comments General comments (skin integrity, edema, etc.): VSS. Wife present during session.      Pertinent Vitals/Pain Pain Assessment: No/denies pain    Home Living                      Prior Function            PT Goals (current goals can now be found in the care plan section) Progress towards PT goals: Progressing toward goals    Frequency    Min 3X/week      PT Plan Current plan remains appropriate    Co-evaluation              AM-PAC PT "6 Clicks" Mobility   Outcome  Measure  Help needed turning from your back to your side while in a flat bed without using bedrails?: None Help needed moving from lying on your back to sitting on the side of a flat bed without using bedrails?: None Help needed moving to and from a bed to a chair (including a wheelchair)?: None Help needed standing up from a chair using your arms (e.g., wheelchair or bedside chair)?: A Little Help needed to walk in hospital room?: A Little Help needed climbing 3-5 steps with a railing? : A Little 6 Click Score: 21    End of Session Equipment Utilized During Treatment: Gait belt Activity Tolerance: Patient tolerated treatment well Patient left: in bed;with call bell/phone within reach;with family/visitor present Nurse Communication: Mobility  status PT Visit Diagnosis: Unsteadiness on feet (R26.81);Muscle weakness (generalized) (M62.81);Difficulty in walking, not elsewhere classified (R26.2)     Time: [id number] PT Time Calculation (min) (ACUTE ONLY): 21 min  Charges:  $Gait Training: 8-22 mins                     Wray Kearns, PT, DPT Acute Rehabilitation Services Pager [phone] Office [phone]       [place] 11/10/2018, 3:19 PM

## 2018-11-10 NOTE — Progress Notes (Addendum)
Patient ID: Keith [place]., male   DOB: July 12, 1950, 68 y.o.   MRN: [id number]     Advanced Heart Failure Rounding Note  PCP-Cardiologist: No primary care provider on file.   Subjective:    Denies SOB. Denies chest pain. He is off oxygen.   CVP 2-3. Remains on milrinone 0.375 mcg.   CT chest with moderate emphysema, 4.5 cm ascending aorta.   CT abdomen with cirrhosis, ?gallstone, subtle 10 x 10 mm lesion head/neck pancreas.   MRI abdomen pancreas protocol: No pancreatic lesion, gallstones in gallbladder without cholecystitis.   Limited echo with Definity was reviewed, I do not see LV thrombus.   RHC Procedural Findings (milrinone 0.375): Hemodynamics (mmHg) RA mean 14 RV 46/13 PA 49/20, 31 PCWP mean 16 Oxygen saturations: PA 70% AO 93% Cardiac Output (Fick) 5.88  Cardiac Index (Fick) 3.06 PVR 2.55 PAPI 2.07 CVP/PCWP 0.875 Cardiac Output (Thermo) 3.16 Cardiac Index (Thermo) 1.64   Objective:   Weight Range: 65.1 kg Body mass index is 20.02 kg/m.   Vital Signs:   Temp:  [97.5 F (36.4 C)-98.3 F (36.8 C)] 97.5 F (36.4 C) (10/19 0455) Pulse Rate:  [86-90] 89 (10/19 0455) Resp:  [15-33] 26 (10/19 0455) BP: (96-127)/(65-90) 127/90 (10/19 0455) SpO2:  [90 %-98 %] 97 % (10/19 0455) Weight:  [65.1 kg] 65.1 kg (10/19 0455) Last BM Date: 11/09/18  Weight change: Filed Weights   11/08/18 0355 11/09/18 0300 11/10/18 0455  Weight: 65.3 kg 65.1 kg 65.1 kg    Intake/Output:   Intake/Output Summary (Last 24 hours) at 11/10/2018 0713 Last data filed at 11/10/2018 0459 Gross per 24 hour  Intake 720 ml  Output 2000 ml  Net -1280 ml      Physical Exam   CVP 2 General:  Well appearing. No resp difficulty HEENT: normal Neck: supple. no JVD. Carotids 2+ bilat; no bruits. No lymphadenopathy or thryomegaly appreciated. Cor: PMI nondisplaced. Regular rate & rhythm. No rubs,  or murmurs. + S3  Lungs: clear Abdomen: soft, nontender, nondistended. No  hepatosplenomegaly. No bruits or masses. Good bowel sounds. Extremities: no cyanosis, clubbing, rash, edema Neuro: alert & orientedx3, cranial nerves grossly intact. moves all 4 extremities w/o difficulty. Affect pleasant    Telemetry  St 100s with occasional PVCs. Personally reviewed   Labs    CBC Recent Labs    11/09/18 0410 11/10/18 0531  WBC 5.3 5.0  NEUTROABS 2.7 2.5  HGB 17.2* 16.4  HCT 49.0 48.5  MCV 86.7 87.4  PLT 222 [id number]   Basic Metabolic Panel Recent Labs    11/08/18 0415 11/08/18 0757 11/09/18 0410 11/10/18 0531  NA 124*  --  129* 129*  K 3.7  --  4.3 4.6  CL 79*  --  86* 89*  CO2 32  --  31 29  GLUCOSE 124*  --  104* 125*  BUN 32*  --  29* 33*  CREATININE 1.42*  --  1.33* 1.37*  CALCIUM 9.3  --  9.4 9.7  MG 19.7* 2.1  --   --    Liver Function Tests Recent Labs    11/09/18 0410 11/10/18 0531  AST 93* 64*  ALT 50* 41  ALKPHOS 74 74  BILITOT 1.3* 1.4*  PROT 8.2* 8.3*  ALBUMIN 3.2* 3.3*   Recent Labs    11/07/18 1645  LIPASE 35  AMYLASE 176*   Cardiac Enzymes No results for input(s): CKTOTAL, CKMB, CKMBINDEX, TROPONINI in the last 72 hours.  BNP: BNP (last 3 results) Recent  Labs    10/30/2018 2019  BNP >4,500.0*    ProBNP (last 3 results) No results for input(s): PROBNP in the last 8760 hours.   D-Dimer No results for input(s): DDIMER in the last 72 hours. Hemoglobin A1C Recent Labs    11/07/18 1645  HGBA1C 6.6*   Fasting Lipid Panel Recent Labs    11/07/18 1645  CHOL 200  HDL 36*  LDLCALC 147*  TRIG 86  CHOLHDL 5.6   Thyroid Function Tests No results for input(s): TSH, T4TOTAL, T3FREE, THYROIDAB in the last 72 hours.  Invalid input(s): FREET3  Other results:   Imaging    No results found.   Medications:     Scheduled Medications: . allopurinol  100 mg Oral Daily  . aspirin EC  81 mg Oral Daily  . Chlorhexidine Gluconate Cloth  6 each Topical Daily  . digoxin  0.125 mg Oral Daily  . feeding  supplement (ENSURE ENLIVE)  237 mL Oral BID BM  . heparin injection (subcutaneous)  5,000 Units Subcutaneous Q8H  . isosorbide-hydrALAZINE  1 tablet Oral TID  . loratadine  10 mg Oral Daily  . mouth rinse  15 mL Mouth Rinse BID  . metoprolol succinate  25 mg Oral Daily  . potassium chloride  10 mEq Oral Once  . sodium chloride flush  10-40 mL Intracatheter Q12H  . sodium chloride flush  3 mL Intravenous Q12H  . spironolactone  25 mg Oral Daily    Infusions: . milrinone 0.375 mcg/kg/min (11/10/18 0144)    PRN Medications: acetaminophen **OR** acetaminophen, albuterol, methocarbamol, nitroGLYCERIN, ondansetron **OR** ondansetron (ZOFRAN) IV, polyethylene glycol, sodium chloride, sodium chloride flush   Assessment/Plan   1. Acute on chronic systolic CHF: Nonischemic cardiomyopathy diagnosed in 2017 with echo showing EF 40% and LHC showing mild nonobstructive CAD.  He saw Dr. Wynonia Lawman in the past, but no cardiology evaluation since 2017.  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  He has drunk moderate ETOH in the past (no longer drinks) but does not seem to have drunk enough to cause a cardiomyopathy.  He was markedly volume overloaded on exam initially with biventricular failure at admission with rise in creatinine concerning for cardiorenal syndrome. Initial co-ox 40% suggested low CO, milrinone started and increased to 0.375.  He was begun on Lasix gtt at 12 mg/hr and later stopped.  RHC showed R>L heart failure.  Cardiac output looked good on milrinone by Fick but was low by thermodilution (discordant values).   - CO-OX pending.   CVP remains low. Continue to hold diuretics. Renal function stable.  - Continue milrinone 0.375 mcg/kg/min.     - Continue digoxin.  - Continue Bidil 1 tab tid.  - Continue spironolactone 25 mg daily.   -  Continue Toprol XL to 25 mg daily with volume overload and low output (also with low HR at times).  - Will need to start thinking about LVAD as an endpoint for him.  CVP remains low, suspect his RV would support LVAD.  However, he also has cirrhosis which will be a consideration.  INR was 1.2 and albumen 3.2 so probably not severe liver dysfunction. LVAD workup started.  He is from [facility] but would stay with his daughter in [place]. There was question of pancreatic lesion but pancreatic protocol MRI showed no lesion.  Now that creatinine improved, will aim to  inject coronaries this week to make sure severe CAD does not account for fall in EF (nonobstructive CAD 2017 but EF higher).  2. AKI on CKD stage 3: Suspect this may be a combination of cardiorenal syndrome and contrast nephropathy (had CTA chest at admission).  Creatinine lower today with milrinone support.  - Continue to support CO with milrinone.  3. 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  He was drinking moderate ETOH as well (not heavily) but has quit completely for > 1 month. INR 1.2, albumen 3.2. NH3 was normal.  4. COPD/smoking: He recently quit smoking.  CT chest with moderate emphysema. He is now off oxygen.  - Pending PFTs.  5. Confusion:  Completely resolved.  Had overnight 10/14-15.  NH3 was normal, doubt hepatic encephalopathy.  No recent ETOH, not due to ETOH withdrawal.  Think may have been due to low output and hospitalization.  CT head unremarkable.  6. Hyponatremia: Need to fluid restrict, 1500 cc.  7. Aortic insufficiency: ?Moderate range.   Will need Palliative Care/SW consult.    Length of Stay: 6  Keith Clegg, NP  11/10/2018, 7:13 AM  Advanced Heart Failure Team Pager [phone] (M-F; [place])  Please contact [facility] for night-coverage after hours (4p -7a ) and weekends on [email or web address]  Patient seen with NP, agree with the above note.   CVP remains low, no co-ox  yet this morning.  No complaints.   On exam, regular S1S2.  Clear lungs.  No edema.  No JVD.   Undergoing LVAD workup.  Needs PFTs today and needs to meet with social worker/palliative care.   Continue current milrinone, can hold off on diuretic today.    Given fall in EF with improvement in creatinine, will plan coronary angiography tomorrow to make sure he has not developed surgical disease in his coronaries since last study in 2017.  Will repeat RHC as well.   Keith Nguyen 11/10/2018 8:01 AM

## 2018-11-10 NOTE — Progress Notes (Addendum)
PROGRESS NOTE    Keith Nguyen.  [id number]  DOB: 10-14-1950  DOA: 11/18/2018 PCP: [name], MD  Brief Narrative:  68 y/o m with h/o Chronic systolic CHF EF 123456 ,HTN, CKD-3, Hep C/cirrhosis-Rx with Harvoni, COPD, tobacco/alcohol use (says quit last month) who currently lives in [place], [place] (Last seen by cardiology Dr Wynonia Lawman in 2017) and visiting daughter in [place] presented to the ED on 10/12 with c/o progressive dyspnea associated with non productive cough and fluid retention x 3 days.Patient on lasix at home and reports compliance. ED Course:In the ER on exam patient has elevated JVD with poor air entry noted on lung exam. BNP of more than 4500 high-sensitivity troponin was 65 and 60.  His d-dimer was elevated, CT angiogram of the chest was done which is negative for pulmonary embolism but did reveal,moderate emphysema, 4.5 cm ascending aorta, anasarca with moderate right and small left pleural effusion.Patient was given Lasix 40 mg IV in the ER and admitted to Hospitalist service with cardiology consultation.  Hospital course: Patient evaluated by cardiology for acute systolic CHF exacerbation, managed with Lasix drip as well as Milrinone gtt.Echo this admission with EF 20-25%, possible LV noncompaction, moderate RV dysfunction. Initially there was rise in creatinine concerning for cardiorenal syndrome/contrast nephropathy but has since improved with milrinone support.He required 02 in the initial hospital course and currently tapered down--now using intermittently for O2 sat drop to 88% during sleep. Walking saturations okay. ACEI and lasix on hold but receiving aldactone/digoxin/b-blockers/nitrates. LH cath/PFTS pending for LVAD work up.RHC showed R>L heart failure.  Cardiac output looked good on milrinone by Fick but was low by thermodilution (discordant values).Previous cath in 2017-non obstructive CAD.  Hospital course also complicated by  delirium/hyponatremia Subjective:  Patient appears comfortable. Saturating well on RA. Remains on Milrinone drip  Objective: Vitals:   11/09/18 2200 11/09/18 2339 11/10/18 0455 11/10/18 0740  BP:  107/84 127/90 (!) 114/91  Pulse: 87 87 89 97  Resp: (!) 33 16 (!) 26 18  Temp:  97.8 F (36.6 C) (!) 97.5 F (36.4 C) 98 F (36.7 C)  TempSrc:  Oral Oral Oral  SpO2: 90% 93% 97% 94%  Weight:   65.1 kg   Height:        Intake/Output Summary (Last 24 hours) at 11/10/2018 0751 Last data filed at 11/10/2018 0459 Gross per 24 hour  Intake 720 ml  Output 1600 ml  Net -880 ml   Filed Weights   11/08/18 0355 11/09/18 0300 11/10/18 0455  Weight: 65.3 kg 65.1 kg 65.1 kg    Physical Examination:  General exam: Appears calm and comfortable  Respiratory system: Clear to auscultation. Respiratory effort normal. Cardiovascular system: S1 & S2 heard, RRR. No JVD, murmurs. No pedal edema. Gastrointestinal system: Abdomen is nondistended, soft and nontender. No organomegaly or masses felt. Normal bowel sounds heard. Central nervous system: Alert and oriented. No focal neurological deficits. Extremities: Symmetric 5 x 5 power. Skin: No rashes, lesions or ulcers Psychiatry: Judgement and insight appear normal. Mood & affect appropriate.     Data Reviewed: I have personally reviewed following labs and imaging studies  CBC: Recent Labs  Lab 11/07/2018 0302  11/04/2018 0817 11/07/18 0420 11/08/18 0415 11/09/18 0410 11/10/18 0531  WBC 4.6  --   --  5.0 5.4 5.3 5.0  NEUTROABS 2.8  --   --  3.0 2.8 2.7 2.5  HGB 13.9   < > 15.6 16.1 16.3 17.2* 16.4  HCT 43.1   < >  46.0 47.7 48.3 49.0 48.5  MCV 90.9  --   --  87.4 86.7 86.7 87.4  PLT 227  --   --  220 216 222 201   < > = values in this interval not displayed.   Basic Metabolic Panel: Recent Labs  Lab 11/11/2018 0302  11/02/2018 0817 11/21/2018 1807 11/07/18 0420 11/08/18 0415 11/08/18 0757 11/09/18 0410 11/10/18 0531  NA 134*   < > 139   --  130* 124*  --  129* 129*  K 3.8   < > 3.4*  --  3.3* 3.7  --  4.3 4.6  CL 95*  --   --   --  86* 79*  --  86* 89*  CO2 25  --   --   --  31 32  --  31 29  GLUCOSE 177*  --   --   --  115* 124*  --  104* 125*  BUN 37*  --   --   --  31* 32*  --  29* 33*  CREATININE 1.95*  --   --   --  1.76* 1.42*  --  1.33* 1.37*  CALCIUM 9.5  --   --   --  9.5 9.3  --  9.4 9.7  MG  --   --   --  1.8  --  19.7* 2.1  --   --    < > = values in this interval not displayed.   GFR: Estimated Creatinine Clearance: 46.9 mL/min (A) (by C-G formula based on SCr of 1.37 mg/dL (H)). Liver Function Tests: Recent Labs  Lab 11/04/18 0611 11/08/18 0415 11/09/18 0410 11/10/18 0531  AST 46* 104* 93* 64*  ALT 15 57* 50* 41  ALKPHOS 67 71 74 74  BILITOT 1.8* 1.4* 1.3* 1.4*  PROT 8.5* 7.4 8.2* 8.3*  ALBUMIN 3.8 3.0* 3.2* 3.3*   Recent Labs  Lab 11/07/18 1645  LIPASE 35  AMYLASE 176*   Recent Labs  Lab 11/07/2018 0829  AMMONIA 30   Coagulation Profile: Recent Labs  Lab 11/18/2018 1056 11/08/18 0415  INR 1.7* 1.2   Cardiac Enzymes: No results for input(s): CKTOTAL, CKMB, CKMBINDEX, TROPONINI in the last 168 hours. BNP (last 3 results) No results for input(s): PROBNP in the last 8760 hours. HbA1C: Recent Labs    11/07/18 1645  HGBA1C 6.6*   CBG: Recent Labs  Lab 11/05/18 2006  GLUCAP 123*   Lipid Profile: Recent Labs    11/07/18 1645  CHOL 200  HDL 36*  LDLCALC 147*  TRIG 86  CHOLHDL 5.6   Thyroid Function Tests: Recent Labs    11/07/18 1645  FREET4 1.04   Anemia Panel: No results for input(s): VITAMINB12, FOLATE, FERRITIN, TIBC, IRON, RETICCTPCT in the last 72 hours. Sepsis Labs: No results for input(s): PROCALCITON, LATICACIDVEN in the last 168 hours.  Recent Results (from the past 240 hour(s))  SARS CORONAVIRUS 2 (TAT 6-24 HRS) Nasopharyngeal Nasopharyngeal Swab     Status: None   Collection Time: 11/04/18 12:29 AM   Specimen: Nasopharyngeal Swab  Result Value Ref  Range Status   SARS Coronavirus 2 NEGATIVE NEGATIVE Final    Comment: (NOTE) SARS-CoV-2 target nucleic acids are NOT DETECTED. The SARS-CoV-2 RNA is generally detectable in upper and lower respiratory specimens during the acute phase of infection. Negative results do not preclude SARS-CoV-2 infection, do not rule out co-infections with other pathogens, and should not be used as the sole basis for treatment or other  patient management decisions. Negative results must be combined with clinical observations, patient history, and epidemiological information. The expected result is Negative. Fact Sheet for Patients: SugarRoll.be Fact Sheet for Healthcare Providers: https://www.woods-mathews.com/ This test is not yet approved or cleared by the [place] FDA and  has been authorized for detection and/or diagnosis of SARS-CoV-2 by FDA under an Emergency Use Authorization (EUA). This EUA will remain  in effect (meaning this test can be used) for the duration of the COVID-19 declaration under Section 56 4(b)(1) of the Act, 21 U.S.C. section 360bbb-3(b)(1), unless the authorization is terminated or revoked sooner. Performed at [facility], [place] [address] [place], [place] [address]   MRSA PCR Screening     Status: None   Collection Time: 11/05/18  2:15 PM   Specimen: Nasal Mucosa; Nasopharyngeal  Result Value Ref Range Status   MRSA by PCR NEGATIVE NEGATIVE Final    Comment:        The GeneXpert MRSA Assay (FDA approved for NASAL specimens only), is one component of a comprehensive MRSA colonization surveillance program. It is not intended to diagnose MRSA infection nor to guide or monitor treatment for MRSA infections. Performed at [facility], [place] [address] [place], [place] [address]       Radiology Studies: Mr Abdomen W Wo Contrast  Result Date: 11/08/2018 CLINICAL DATA:  68 year old male with  potential mass in the pancreas noted on prior CT examination. EXAM: MRI ABDOMEN WITHOUT AND WITH CONTRAST TECHNIQUE: Multiplanar multisequence MR imaging of the abdomen was performed both before and after the administration of intravenous contrast. CONTRAST:  50mL GADAVIST GADOBUTROL 1 MMOL/ML IV SOLN COMPARISON:  None. FINDINGS: Lower chest: Cardiomegaly. Hepatobiliary: No suspicious cystic or solid hepatic lesions. No intra or extrahepatic biliary ductal dilatation. Multiple filling defects in the gallbladder, compatible with gallstones, largest of which measures up to 2.2 cm in diameter. Gallbladder is nearly decompressed, without surrounding inflammatory changes or pericholecystic fluid. Pancreas: No pancreatic mass. No pancreatic ductal dilatation. No pancreatic or peripancreatic fluid collections or inflammatory changes. Spleen:  Unremarkable. Adrenals/Urinary Tract: In the anterior aspect of the interpolar region of the left kidney (axial image 25 of series 5) there is a 7 mm lesion that is mildly T1 hyperintense, mildly T2 hyperintense, and does not enhance, compatible with a mildly proteinaceous cyst. Right kidney and bilateral adrenal glands are normal in appearance. No hydroureteronephrosis in the visualized portions of the abdomen. Stomach/Bowel: Scattered colonic diverticulae are noted in the descending colon. Vascular/Lymphatic: Aortic atherosclerosis, without evidence of aneurysm in the abdominal vasculature. No lymphadenopathy noted in the abdomen. Other: No significant volume of ascites noted in the visualized portions of the peritoneal cavity. Musculoskeletal: No aggressive appearing osseous lesions are noted in the visualized portions of the skeleton. IMPRESSION: 1. No pancreatic lesion to account for the perceived abnormality on the recent CT examination. 2. Cholelithiasis without evidence of acute cholecystitis at this time. 3. Additional incidental findings, as above. Electronically Signed   By:  Vinnie Langton M.D.   On: 11/08/2018 17:57   Vas US Doppler Pre Vad  Result Date: 11/09/2018 PERIOPERATIVE VASCULAR EVALUATION Risk Factors:     Hyperlipidemia, current smoker. Other Factors:    Pre-op VAD,. Comparison Study: No prior study on file Performing Technologist: Carlos Levering Rvt  Examination Guidelines: A complete evaluation includes B-mode imaging, spectral Doppler, color Doppler, and power Doppler as needed of all accessible portions of each vessel. Bilateral testing is considered an integral part of a  complete examination. Limited examinations for reoccurring indications may be performed as noted.  Right Carotid Findings: +----------+--------+--------+--------+------------+------------------+             PSV cm/s EDV cm/s Stenosis Describe     Comments            +----------+--------+--------+--------+------------+------------------+  CCA Prox   57       7                              intimal thickening  +----------+--------+--------+--------+------------+------------------+  CCA Distal 56       10                             intimal thickening  +----------+--------+--------+--------+------------+------------------+  ICA Prox   25       8                 heterogenous                     +----------+--------+--------+--------+------------+------------------+  ICA Distal 42       14                                                 +----------+--------+--------+--------+------------+------------------+  ECA        53       2                                                  +----------+--------+--------+--------+------------+------------------+ Portions of this table do not appear on this page. +----------+--------+-------+--------+------------+             PSV cm/s EDV cms Describe Arm Pressure  +----------+--------+-------+--------+------------+  Subclavian 57                                      +----------+--------+-------+--------+------------+ +---------+--------+--+--------+-+  Vertebral PSV  cm/s 44 EDV cm/s 9  +---------+--------+--+--------+-+ Left Carotid Findings: +----------+--------+--------+--------+------------+------------------+             PSV cm/s EDV cm/s Stenosis Describe     Comments            +----------+--------+--------+--------+------------+------------------+  CCA Prox   97       7                              intimal thickening  +----------+--------+--------+--------+------------+------------------+  CCA Distal 58       11                             intimal thickening  +----------+--------+--------+--------+------------+------------------+  ICA Prox   33       10                heterogenous                     +----------+--------+--------+--------+------------+------------------+  ICA Distal 39       15                                                 +----------+--------+--------+--------+------------+------------------+  ECA        101      9                                                  +----------+--------+--------+--------+------------+------------------+ +----------+--------+--------+--------+------------+  Subclavian PSV cm/s EDV cm/s Describe Arm Pressure  +----------+--------+--------+--------+------------+             59                         91            +----------+--------+--------+--------+------------+ +---------+--------+--+--------+-+  Vertebral PSV cm/s 32 EDV cm/s 6  +---------+--------+--+--------+-+  ABI Findings: +--------+------------------+-----+---------+--------+  Right    Rt Pressure (mmHg) Index Waveform  Comment   +--------+------------------+-----+---------+--------+  Brachial                                    PICC      +--------+------------------+-----+---------+--------+  PTA      100                1.10  triphasic           +--------+------------------+-----+---------+--------+  DP       89                 0.98  triphasic           +--------+------------------+-----+---------+--------+ +--------+------------------+-----+---------+-------+   Left     Lt Pressure (mmHg) Index Waveform  Comment  +--------+------------------+-----+---------+-------+  Brachial 91                                          +--------+------------------+-----+---------+-------+  PTA      113                1.24  triphasic          +--------+------------------+-----+---------+-------+  DP       82                 0.90  triphasic          +--------+------------------+-----+---------+-------+ +-------+---------------+----------------+  ABI/TBI Today's ABI/TBI Previous ABI/TBI  +-------+---------------+----------------+  Right   1.1                               +-------+---------------+----------------+  Left    1.24                              +-------+---------------+----------------+  Summary: Right Carotid: The extracranial vessels were near-normal with only minimal wall                thickening or plaque. Left Carotid: The extracranial vessels were near-normal with only minimal wall               thickening or plaque. Vertebrals:  Bilateral vertebral arteries demonstrate antegrade flow. Subclavians: Normal flow hemodynamics were seen in bilateral subclavian              arteries.  *See table(s) above for measurements and observations. Right ABI: Resting right ankle-brachial index is within  normal range. No evidence of significant right lower extremity arterial disease. Left ABI: Resting left ankle-brachial index is within normal range. No evidence of significant left lower extremity arterial disease.  Electronically signed by Harold Barban MD on 11/09/2018 at 4:36:31 PM.    Final         Scheduled Meds:  allopurinol  100 mg Oral Daily   aspirin EC  81 mg Oral Daily   Chlorhexidine Gluconate Cloth  6 each Topical Daily   digoxin  0.125 mg Oral Daily   feeding supplement (ENSURE ENLIVE)  237 mL Oral BID BM   heparin injection (subcutaneous)  5,000 Units Subcutaneous Q8H   isosorbide-hydrALAZINE  1 tablet Oral TID   loratadine  10 mg Oral Daily   mouth  rinse  15 mL Mouth Rinse BID   metoprolol succinate  25 mg Oral Daily   sodium chloride flush  10-40 mL Intracatheter Q12H   sodium chloride flush  3 mL Intravenous Q12H   spironolactone  25 mg Oral Daily   Continuous Infusions:  milrinone 0.375 mcg/kg/min (11/10/18 0144)    Assessment & Plan:    1. Acute on chronic systolic CHF with acute hypoxic respiratory failure: POA. Being managed by cardiology mostly. Remains on Milrinone drip. Continue asa, digoxin, spironolactone, lower dose metoprolol, BiDil per CHF team. Losaratn and Lasix remain on hold. S/P RHC during the hospital course. Cardiology planning on angiography/coronary eval , PFts (given h/o smoking) as part of LVAD w/u. Off O2 , pulsox good except during sleep  2. Cardiorenal syndrome with  AKI on CKD-3: Creat now improved and stable around 1.3 with ionotropes. Losartan/lasix on hold as discussed above  3.  Hep C/Liver Cirrhosis: Lasix on hold. No evidence of ascitis currently and mentating well. Follow Hep C RNA quant to confirm Hep C clearance as part of LVAD work up. Continue aldactone  4.  HTN: BP controlled on current regimen.   5. Hyponatremia: asymptomatic, improved and stable (134-->130-->124->129). ?Hypervolemic hyponatremia vs diuretic induced (on aldactone)  6. Delirium: resolved. Likely med induced per prior notes. Currently AAO X3.   7. ?Sleep apnea: Pulsox dropping to 88% only during sleep. Does not have the typical body habitus for OSA. Will need sleep study at some point. May need O2 set up for use during sleep  8. Severe PCM : patient has hypoalbuminemia in the setting of #3. Seen by dietician.   DVT prophylaxis: Heparin Code Status: Full Family / Patient Communication: d/w patient, all qs answered to satisfaction Disposition Plan: Home when medically cleared by Cards     LOS: 6 days    Time spent: 35 minutes    Guilford Shi, MD [facility] Pager [phone]  If 7PM-7AM,  please contact night-coverage [email or web address] Password [email or web address] 11/10/2018, 7:51 AM

## 2018-11-10 NOTE — Progress Notes (Signed)
Co-ox was drawn this AM. This RN tried to send through tube station, error message stating "destination is full, can not send". Respiratory was called 2x, first time called was told that "maybe no one was there and that they would check", over an hour later still trying to send co-ox still getting same message. 2nd call to respiratory this RN was told that "there is nothing wrong and that I would have to walk it over". Unable to leave the unit.

## 2018-11-10 NOTE — Consult Note (Signed)
Consultation Note Date: 11/10/2018   Patient Name: Keith Nguyen.  DOB: 09-20-1950  MRN: [id number]  Age / Sex: 68 y.o., male  PCP: [name], MD Referring Physician: Guilford Shi, MD  Reason for Consultation: LVAD evaluation  HPI/Patient Profile: 68 y.o. male   admitted on 11/15/2018 with past medical  history of chronic systolic CHF, HTN, CKD stage 3, HCV/cirrhosis, smoking/COPD.    Patient had an echo in 2017 with EF 40%.    He then had LHC showing mild nonobstructive CAD and EF by LV-gram 35-40%.  He was seen by Dr. Wynonia Lawman at that time in 2017 but has not been seen by a cardiologist since then.    He has also been found to have cirrhosis, probably due to HCV which has been treated with Harvoni.  He was a smoker but quit in 9/20.  He has history of COPD listed but no PFTs.  He drinks 1 pint liquor per week, quit 1 month ago.  He has drank about this much for a long time, denies heavier ETOH.      He reports weight loss over the last couple of months.  He has not had an appetite.  No evidence for malignancy has been found.  He has been short of breath with most exertion for the last 2 weeks.  He has been orthopneic and has had trouble sleeping.  No chest pain.  He came to the ER due to dyspnea and was admitted.    He had an elevated D dimer and had CTA chest to rule out PE, there was no PE found.  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    He lives in [place] with his wife but plans to move to [place] to live with his daughter and her husband  Day 6 of hospitalization, remains on Milrinone and under evaluation for possible LAVD  Clinical Assessment and Goals of Care:    This NP Wadie Lessen reviewed medical records, received report from team, assessed the patient and then meet at the bedside to discuss advanced directives  and a preparedness plan in light of consideration of  LVAD implantation and therapy.    A  discussion was had today regarding the concept of a preparedness plan as it relates to LVAD placement and its sequelae.    Patient was comfortable talking about the "what ifs"  and the importance of today's conversation in hopes  to understand the patient's basic beliefs and wishes as it relates  to healthcare.  Concepts specific to future possibilities of -long term ventilation -artificial feeding and hydration -dialysis -psychological adjustments -need to terminate the pump- Other chronic or terminal disease unrelated to cardiac LVAD  Patient was able to verbalize the importance of quality of life to him, indeed his consideration for LVAD is his goal of more continued, quality life.  He understands the risks and benefits of the procedure as it relates to his future.   He was grateful  for a visit from [place] a LVAD patient himself.  At this time patient is open to all available medical interventions to prolong life and the success of the LVAD therapy.   Patient was encouraged to continue conversation with his family, as it is vital for  patient centered care.  He tells me that his wife will be his main support person along with his daughter with whom he will be living with on discharge   Chaplain services offered and referral written for.         No documented HPOA or AD.   Will f/u in am with blue book and MOST form    SUMMARY OF RECOMMENDATIONS    Code Status/Advance Care Planning:  -  Full Code   Additional Recommendations (Limitations, Scope, Preferences):  Patient is open to all offered and available medical interventions to prolong life  Psycho-social/Spiritual:   Desire for further Chaplaincy support: yes  Additional Recommendations: Created space and opportunity for Mr Bachtell to explore his thoughts and feelings regarding his current medical situations and decisions he  is facing     Primary Diagnoses: Present on Admission: . History of hepatitis C . Hyperlipidemia . Essential hypertension . Acute on chronic systolic (congestive) heart failure ([place])   I have reviewed the medical record, interviewed the patient and family, and examined the patient. The following aspects are pertinent.  Past Medical History:  Diagnosis Date  . Abnormal cardiac function test 09/16/2015  . Aortic atherosclerosis ([place]) 09/16/2015  . History of hepatitis C 09/16/2015   Prior treatment with Harvoni   . Hyperlipidemia 09/16/2015  . Hypertensive heart disease without CHF 09/16/2015   Social History   Socioeconomic History  . Marital status: Married    Spouse name: Not on file  . Number of children: Not on file  . Years of education: Not on file  . Highest education level: Not on file  Occupational History  . Not on file  Social Needs  . Financial resource strain: Not on file  . Food insecurity    Worry: Not on file    Inability: Not on file  . Transportation needs    Medical: Not on file    Non-medical: Not on file  Tobacco Use  . Smoking status: Current Every Day Smoker  . Smokeless tobacco: Never Used  Substance and Sexual Activity  . Alcohol use: Yes    Alcohol/week: 4.0 - 6.0 standard drinks    Types: 4 - 6 Shots of liquor per week    Comment: Pt drinks beer and liquor daily  . Drug use: No  . Sexual activity: Not on file  Lifestyle  . Physical activity    Days per week: Not on file    Minutes per session: Not on file  . Stress: Not on file  Relationships  . Social Herbalist on phone: Not on file    Gets together: Not on file    Attends religious service: Not on file    Active member of club or organization: Not on file    Attends meetings of clubs or organizations: Not on file    Relationship status: Not on file  Other Topics Concern  . Not on file  Social History Narrative  . Not on file   Family History  Problem Relation Age  of Onset  . Stroke Mother   . Lung cancer Father   . Hypertension Sister    Scheduled Meds: . allopurinol  100  mg Oral Daily  . aspirin EC  81 mg Oral Daily  . Chlorhexidine Gluconate Cloth  6 each Topical Daily  . digoxin  0.125 mg Oral Daily  . feeding supplement (ENSURE ENLIVE)  237 mL Oral TID WC  . heparin injection (subcutaneous)  5,000 Units Subcutaneous Q8H  . isosorbide-hydrALAZINE  1 tablet Oral TID  . loratadine  10 mg Oral Daily  . mouth rinse  15 mL Mouth Rinse BID  . metoprolol succinate  25 mg Oral Daily  . sodium chloride flush  10-40 mL Intracatheter Q12H  . sodium chloride flush  3 mL Intravenous Q12H  . sodium chloride flush  3 mL Intravenous Q12H  . spironolactone  25 mg Oral Daily   Continuous Infusions: . milrinone 0.375 mcg/kg/min (11/10/18 0144)   PRN Meds:.acetaminophen **OR** acetaminophen, albuterol, methocarbamol, nitroGLYCERIN, ondansetron **OR** ondansetron (ZOFRAN) IV, polyethylene glycol, sodium chloride, sodium chloride flush Medications Prior to Admission:  Prior to Admission medications   Medication Sig Start Date End Date Taking? Authorizing Provider  albuterol (PROVENTIL HFA;VENTOLIN HFA) 108 (90 Base) MCG/ACT inhaler Inhale 1-2 puffs into the lungs every 6 (six) hours as needed for wheezing or shortness of breath. 03/24/18  Yes Wieters, Hallie C, PA-C  allopurinol (ZYLOPRIM) 100 MG tablet Take 100 mg by mouth daily.   Yes [provider]  aspirin 81 MG tablet Take 81 mg by mouth daily.   Yes [provider]  furosemide (LASIX) 40 MG tablet Take 40 mg by mouth daily.   Yes [provider]  hydrALAZINE (APRESOLINE) 25 MG tablet Take 25 mg by mouth 2 (two) times daily.   Yes [provider]  losartan (COZAAR) 100 MG tablet Take 100 mg by mouth daily.   Yes [provider]  metoprolol succinate (TOPROL-XL) 100 MG 24 hr tablet Take 200 mg by mouth daily. Take with or immediately following a meal.   Yes  [provider]   Allergies  Allergen Reactions  . Lipitor [Atorvastatin]     Elevated liver enzymes   Review of Systems  Constitutional: Positive for fatigue.    Physical Exam Constitutional:      Appearance: He is well-developed.  Cardiovascular:     Rate and Rhythm: Regular rhythm.  Pulmonary:     Effort: Pulmonary effort is normal.  Skin:    General: Skin is warm and dry.  Neurological:     Mental Status: He is oriented to person, place, and time.  Psychiatric:        Mood and Affect: Mood normal.     Vital Signs: BP 91/80 (BP Location: Left Arm)   Pulse 96   Temp (!) 97.5 F (36.4 C) (Oral)   Resp 11   Ht 5\' 11"  (1.803 m)   Wt 65.1 kg   SpO2 100%   BMI 20.02 kg/m  Pain Scale: 0-10 POSS *See Group Information*: 1-Acceptable,Awake and alert Pain Score: 0-No pain   SpO2: SpO2: 100 % O2 Device:SpO2: 100 % O2 Flow Rate: .O2 Flow Rate (L/min): 4 L/min  IO: Intake/output summary:   Intake/Output Summary (Last 24 hours) at 11/10/2018 1221 Last data filed at 11/10/2018 1000 Gross per 24 hour  Intake 736.15 ml  Output 1525 ml  Net -788.85 ml    LBM: Last BM Date: 11/09/18 Baseline Weight: Weight: 74.8 kg Most recent weight: Weight: 65.1 kg     Palliative Assessment/Data:   Discussed with Tressia Danas  Time In: 0850 Time Out: 1000 Time Total: 70 minutes Greater  than 50%  of this time was spent counseling and coordinating care related to the above assessment and plan.  Signed by: Wadie Lessen, NP   Please contact Palliative Medicine Team phone at [phone] for questions and concerns.  For individual provider: See Shea Evans

## 2018-11-10 NOTE — Plan of Care (Signed)
  Problem: Education: Goal: Knowledge of General Education information will improve Description: Including pain rating scale, medication(s)/side effects and non-pharmacologic comfort measures 11/10/2018 1129 by Lubertha South, RN Outcome: Progressing  Problem: Clinical Measurements: Goal: Cardiovascular complication will be avoided Outcome: Progressing   Problem: Nutrition: Goal: Adequate nutrition will be maintained Outcome: Progressing   Problem: Pain Managment: Goal: General experience of comfort will improve Outcome: Progressing   Problem: Safety: Goal: Ability to remain free from injury will improve Outcome: Progressing

## 2018-11-10 NOTE — H&P (View-Only) (Signed)
Patient ID: Keith Catino., male   DOB: 06-Nov-1950, 68 y.o.   MRN: [id number]     Advanced Heart Failure Rounding Note  PCP-Cardiologist: No primary care provider on file.   Subjective:    Denies SOB. Denies chest pain. He is off oxygen.   CVP 2-3. Remains on milrinone 0.375 mcg.   CT chest with moderate emphysema, 4.5 cm ascending aorta.   CT abdomen with cirrhosis, ?gallstone, subtle 10 x 10 mm lesion head/neck pancreas.   MRI abdomen pancreas protocol: No pancreatic lesion, gallstones in gallbladder without cholecystitis.   Limited echo with Definity was reviewed, I do not see LV thrombus.   RHC Procedural Findings (milrinone 0.375): Hemodynamics (mmHg) RA mean 14 RV 46/13 PA 49/20, 31 PCWP mean 16 Oxygen saturations: PA 70% AO 93% Cardiac Output (Fick) 5.88  Cardiac Index (Fick) 3.06 PVR 2.55 PAPI 2.07 CVP/PCWP 0.875 Cardiac Output (Thermo) 3.16 Cardiac Index (Thermo) 1.64   Objective:   Weight Range: 65.1 kg Body mass index is 20.02 kg/m.   Vital Signs:   Temp:  [97.5 F (36.4 C)-98.3 F (36.8 C)] 97.5 F (36.4 C) (10/19 0455) Pulse Rate:  [86-90] 89 (10/19 0455) Resp:  [15-33] 26 (10/19 0455) BP: (96-127)/(65-90) 127/90 (10/19 0455) SpO2:  [90 %-98 %] 97 % (10/19 0455) Weight:  [65.1 kg] 65.1 kg (10/19 0455) Last BM Date: 11/09/18  Weight change: Filed Weights   11/08/18 0355 11/09/18 0300 11/10/18 0455  Weight: 65.3 kg 65.1 kg 65.1 kg    Intake/Output:   Intake/Output Summary (Last 24 hours) at 11/10/2018 0713 Last data filed at 11/10/2018 0459 Gross per 24 hour  Intake 720 ml  Output 2000 ml  Net -1280 ml      Physical Exam   CVP 2 General:  Well appearing. No resp difficulty HEENT: normal Neck: supple. no JVD. Carotids 2+ bilat; no bruits. No lymphadenopathy or thryomegaly appreciated. Cor: PMI nondisplaced. Regular rate & rhythm. No rubs,  or murmurs. + S3  Lungs: clear Abdomen: soft, nontender, nondistended. No  hepatosplenomegaly. No bruits or masses. Good bowel sounds. Extremities: no cyanosis, clubbing, rash, edema Neuro: alert & orientedx3, cranial nerves grossly intact. moves all 4 extremities w/o difficulty. Affect pleasant    Telemetry  St 100s with occasional PVCs. Personally reviewed   Labs    CBC Recent Labs    11/09/18 0410 11/10/18 0531  WBC 5.3 5.0  NEUTROABS 2.7 2.5  HGB 17.2* 16.4  HCT 49.0 48.5  MCV 86.7 87.4  PLT 222 [id number]   Basic Metabolic Panel Recent Labs    11/08/18 0415 11/08/18 0757 11/09/18 0410 11/10/18 0531  NA 124*  --  129* 129*  K 3.7  --  4.3 4.6  CL 79*  --  86* 89*  CO2 32  --  31 29  GLUCOSE 124*  --  104* 125*  BUN 32*  --  29* 33*  CREATININE 1.42*  --  1.33* 1.37*  CALCIUM 9.3  --  9.4 9.7  MG 19.7* 2.1  --   --    Liver Function Tests Recent Labs    11/09/18 0410 11/10/18 0531  AST 93* 64*  ALT 50* 41  ALKPHOS 74 74  BILITOT 1.3* 1.4*  PROT 8.2* 8.3*  ALBUMIN 3.2* 3.3*   Recent Labs    11/07/18 1645  LIPASE 35  AMYLASE 176*   Cardiac Enzymes No results for input(s): CKTOTAL, CKMB, CKMBINDEX, TROPONINI in the last 72 hours.  BNP: BNP (last 3 results) Recent  Labs    11/11/2018 2019  BNP >4,500.0*    ProBNP (last 3 results) No results for input(s): PROBNP in the last 8760 hours.   D-Dimer No results for input(s): DDIMER in the last 72 hours. Hemoglobin A1C Recent Labs    11/07/18 1645  HGBA1C 6.6*   Fasting Lipid Panel Recent Labs    11/07/18 1645  CHOL 200  HDL 36*  LDLCALC 147*  TRIG 86  CHOLHDL 5.6   Thyroid Function Tests No results for input(s): TSH, T4TOTAL, T3FREE, THYROIDAB in the last 72 hours.  Invalid input(s): FREET3  Other results:   Imaging    No results found.   Medications:     Scheduled Medications: . allopurinol  100 mg Oral Daily  . aspirin EC  81 mg Oral Daily  . Chlorhexidine Gluconate Cloth  6 each Topical Daily  . digoxin  0.125 mg Oral Daily  . feeding  supplement (ENSURE ENLIVE)  237 mL Oral BID BM  . heparin injection (subcutaneous)  5,000 Units Subcutaneous Q8H  . isosorbide-hydrALAZINE  1 tablet Oral TID  . loratadine  10 mg Oral Daily  . mouth rinse  15 mL Mouth Rinse BID  . metoprolol succinate  25 mg Oral Daily  . potassium chloride  10 mEq Oral Once  . sodium chloride flush  10-40 mL Intracatheter Q12H  . sodium chloride flush  3 mL Intravenous Q12H  . spironolactone  25 mg Oral Daily    Infusions: . milrinone 0.375 mcg/kg/min (11/10/18 0144)    PRN Medications: acetaminophen **OR** acetaminophen, albuterol, methocarbamol, nitroGLYCERIN, ondansetron **OR** ondansetron (ZOFRAN) IV, polyethylene glycol, sodium chloride, sodium chloride flush   Assessment/Plan   1. Acute on chronic systolic CHF: Nonischemic cardiomyopathy diagnosed in 2017 with echo showing EF 40% and LHC showing mild nonobstructive CAD.  He saw Dr. Wynonia Lawman in the past, but no cardiology evaluation since 2017.  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  He has drunk moderate ETOH in the past (no longer drinks) but does not seem to have drunk enough to cause a cardiomyopathy.  He was markedly volume overloaded on exam initially with biventricular failure at admission with rise in creatinine concerning for cardiorenal syndrome. Initial co-ox 40% suggested low CO, milrinone started and increased to 0.375.  He was begun on Lasix gtt at 12 mg/hr and later stopped.  RHC showed R>L heart failure.  Cardiac output looked good on milrinone by Fick but was low by thermodilution (discordant values).   - CO-OX pending.   CVP remains low. Continue to hold diuretics. Renal function stable.  - Continue milrinone 0.375 mcg/kg/min.     - Continue digoxin.  - Continue Bidil 1 tab tid.  - Continue spironolactone 25 mg daily.   -  Continue Toprol XL to 25 mg daily with volume overload and low output (also with low HR at times).  - Will need to start thinking about LVAD as an endpoint for him.  CVP remains low, suspect his RV would support LVAD.  However, he also has cirrhosis which will be a consideration.  INR was 1.2 and albumen 3.2 so probably not severe liver dysfunction. LVAD workup started.  He is from [facility] but would stay with his daughter in [place]. There was question of pancreatic lesion but pancreatic protocol MRI showed no lesion.  Now that creatinine improved, will aim to  inject coronaries this week to make sure severe CAD does not account for fall in EF (nonobstructive CAD 2017 but EF higher).  2. AKI on CKD stage 3: Suspect this may be a combination of cardiorenal syndrome and contrast nephropathy (had CTA chest at admission).  Creatinine lower today with milrinone support.  - Continue to support CO with milrinone.  3. 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  He was drinking moderate ETOH as well (not heavily) but has quit completely for > 1 month. INR 1.2, albumen 3.2. NH3 was normal.  4. COPD/smoking: He recently quit smoking.  CT chest with moderate emphysema. He is now off oxygen.  - Pending PFTs.  5. Confusion:  Completely resolved.  Had overnight 10/14-15.  NH3 was normal, doubt hepatic encephalopathy.  No recent ETOH, not due to ETOH withdrawal.  Think may have been due to low output and hospitalization.  CT head unremarkable.  6. Hyponatremia: Need to fluid restrict, 1500 cc.  7. Aortic insufficiency: ?Moderate range.   Will need Palliative Care/SW consult.    Length of Stay: 6  Amy Clegg, NP  11/10/2018, 7:13 AM  Advanced Heart Failure Team Pager [phone] (M-F; [place])  Please contact [facility] for night-coverage after hours (4p -7a ) and weekends on [email or web address]  Patient seen with NP, agree with the above note.   CVP remains low, no co-ox  yet this morning.  No complaints.   On exam, regular S1S2.  Clear lungs.  No edema.  No JVD.   Undergoing LVAD workup.  Needs PFTs today and needs to meet with social worker/palliative care.   Continue current milrinone, can hold off on diuretic today.    Given fall in EF with improvement in creatinine, will plan coronary angiography tomorrow to make sure he has not developed surgical disease in his coronaries since last study in 2017.  Will repeat RHC as well.   Loralie Champagne 11/10/2018 8:01 AM

## 2018-11-11 ENCOUNTER — Encounter (HOSPITAL_COMMUNITY): Admission: EM | Disposition: E | Payer: Self-pay | Source: Home / Self Care | Attending: Cardiothoracic Surgery

## 2018-11-11 ENCOUNTER — Encounter (HOSPITAL_COMMUNITY): Payer: Self-pay | Admitting: Cardiology

## 2018-11-11 DIAGNOSIS — K7460 Unspecified cirrhosis of liver: Secondary | ICD-10-CM | POA: Diagnosis not present

## 2018-11-11 DIAGNOSIS — Z66 Do not resuscitate: Secondary | ICD-10-CM

## 2018-11-11 DIAGNOSIS — I5023 Acute on chronic systolic (congestive) heart failure: Secondary | ICD-10-CM | POA: Diagnosis not present

## 2018-11-11 DIAGNOSIS — I509 Heart failure, unspecified: Secondary | ICD-10-CM

## 2018-11-11 DIAGNOSIS — E871 Hypo-osmolality and hyponatremia: Secondary | ICD-10-CM

## 2018-11-11 DIAGNOSIS — R5383 Other fatigue: Secondary | ICD-10-CM

## 2018-11-11 DIAGNOSIS — N183 Chronic kidney disease, stage 3 unspecified: Secondary | ICD-10-CM | POA: Diagnosis not present

## 2018-11-11 DIAGNOSIS — N179 Acute kidney failure, unspecified: Secondary | ICD-10-CM | POA: Diagnosis not present

## 2018-11-11 HISTORY — PX: RIGHT/LEFT HEART CATH AND CORONARY ANGIOGRAPHY: CATH118266

## 2018-11-11 LAB — BLOOD GAS, ARTERIAL
Acid-Base Excess: 4.5 mmol/L — ABNORMAL HIGH (ref 0.0–2.0)
Bicarbonate: 28 mmol/L (ref 20.0–28.0)
Drawn by: 414221
FIO2: 21
O2 Saturation: 96.3 %
Patient temperature: 98.6
pCO2 arterial: 37.9 mmHg (ref 32.0–48.0)
pH, Arterial: 7.482 — ABNORMAL HIGH (ref 7.350–7.450)
pO2, Arterial: 85.6 mmHg (ref 83.0–108.0)

## 2018-11-11 LAB — COMPREHENSIVE METABOLIC PANEL
ALT: 32 U/L (ref 0–44)
AST: 55 U/L — ABNORMAL HIGH (ref 15–41)
Albumin: 3.1 g/dL — ABNORMAL LOW (ref 3.5–5.0)
Alkaline Phosphatase: 64 U/L (ref 38–126)
Anion gap: 10 (ref 5–15)
BUN: 34 mg/dL — ABNORMAL HIGH (ref 8–23)
CO2: 26 mmol/L (ref 22–32)
Calcium: 9.8 mg/dL (ref 8.9–10.3)
Chloride: 91 mmol/L — ABNORMAL LOW (ref 98–111)
Creatinine, Ser: 1.38 mg/dL — ABNORMAL HIGH (ref 0.61–1.24)
GFR calc Af Amer: >60 mL/min (ref >60)
GFR calc non Af Amer: 52 mL/min — ABNORMAL LOW (ref >60)
Glucose, Bld: 99 mg/dL (ref 70–99)
Potassium: 4.9 mmol/L (ref 3.5–5.1)
Sodium: 127 mmol/L — ABNORMAL LOW (ref 135–145)
Total Bilirubin: 2 mg/dL — ABNORMAL HIGH (ref 0.3–1.2)
Total Protein: 7.5 g/dL (ref 6.5–8.1)

## 2018-11-11 LAB — CBC WITH DIFFERENTIAL/PLATELET
Abs Immature Granulocytes: 0.01 10*3/uL (ref 0.00–0.07)
Basophils Absolute: 0 10*3/uL (ref 0.0–0.1)
Basophils Relative: 0 %
Eosinophils Absolute: 0.1 10*3/uL (ref 0.0–0.5)
Eosinophils Relative: 1 %
HCT: 47.8 % (ref 39.0–52.0)
Hemoglobin: 16.9 g/dL (ref 13.0–17.0)
Immature Granulocytes: 0 %
Lymphocytes Relative: 32 %
Lymphs Abs: 1.8 10*3/uL (ref 0.7–4.0)
MCH: 30.2 pg (ref 26.0–34.0)
MCHC: 35.4 g/dL (ref 30.0–36.0)
MCV: 85.5 fL (ref 80.0–100.0)
Monocytes Absolute: 0.9 10*3/uL (ref 0.1–1.0)
Monocytes Relative: 17 %
Neutro Abs: 2.7 10*3/uL (ref 1.7–7.7)
Neutrophils Relative %: 50 %
Platelets: 186 10*3/uL (ref 150–400)
RBC: 5.59 MIL/uL (ref 4.22–5.81)
RDW: 15.2 % (ref 11.5–15.5)
WBC: 5.5 10*3/uL (ref 4.0–10.5)
nRBC: 0 % (ref 0.0–0.2)

## 2018-11-11 LAB — POCT I-STAT EG7
Acid-Base Excess: 4 mmol/L — ABNORMAL HIGH (ref 0.0–2.0)
Acid-Base Excess: 4 mmol/L — ABNORMAL HIGH (ref 0.0–2.0)
Bicarbonate: 29.5 mmol/L — ABNORMAL HIGH (ref 20.0–28.0)
Bicarbonate: 29.6 mmol/L — ABNORMAL HIGH (ref 20.0–28.0)
Calcium, Ion: 1.21 mmol/L (ref 1.15–1.40)
Calcium, Ion: 1.26 mmol/L (ref 1.15–1.40)
HCT: 52 % (ref 39.0–52.0)
HCT: 52 % (ref 39.0–52.0)
Hemoglobin: 17.7 g/dL — ABNORMAL HIGH (ref 13.0–17.0)
Hemoglobin: 17.7 g/dL — ABNORMAL HIGH (ref 13.0–17.0)
O2 Saturation: 73 %
O2 Saturation: 74 %
Potassium: 4.3 mmol/L (ref 3.5–5.1)
Potassium: 4.3 mmol/L (ref 3.5–5.1)
Sodium: 130 mmol/L — ABNORMAL LOW (ref 135–145)
Sodium: 131 mmol/L — ABNORMAL LOW (ref 135–145)
TCO2: 31 mmol/L (ref 22–32)
TCO2: 31 mmol/L (ref 22–32)
pCO2, Ven: 46.9 mmHg (ref 44.0–60.0)
pCO2, Ven: 48.3 mmHg (ref 44.0–60.0)
pH, Ven: 7.395 (ref 7.250–7.430)
pH, Ven: 7.406 (ref 7.250–7.430)
pO2, Ven: 39 mmHg (ref 32.0–45.0)
pO2, Ven: 39 mmHg (ref 32.0–45.0)

## 2018-11-11 LAB — COOXEMETRY PANEL
Carboxyhemoglobin: 1.2 % (ref 0.5–1.5)
Methemoglobin: 0.5 % (ref 0.0–1.5)
O2 Saturation: 61.2 %
Total hemoglobin: 16.8 g/dL — ABNORMAL HIGH (ref 12.0–16.0)

## 2018-11-11 LAB — CEA: CEA: 4.3 ng/mL (ref 0.0–4.7)

## 2018-11-11 LAB — DIGOXIN LEVEL: Digoxin Level: 0.4 ng/mL — ABNORMAL LOW (ref 0.8–2.0)

## 2018-11-11 LAB — HEPATITIS C VRS RNA DETECT BY PCR-QUAL: Hepatitis C Vrs RNA by PCR-Qual: NEGATIVE

## 2018-11-11 SURGERY — RIGHT/LEFT HEART CATH AND CORONARY ANGIOGRAPHY
Anesthesia: LOCAL

## 2018-11-11 MED ORDER — MIDAZOLAM HCL 2 MG/2ML IJ SOLN
INTRAMUSCULAR | Status: AC
  Filled 2018-11-11: qty 2

## 2018-11-11 MED ORDER — HEPARIN SODIUM (PORCINE) 5000 UNIT/ML IJ SOLN
5000.0000 [IU] | Freq: Three times a day (TID) | INTRAMUSCULAR | Status: DC
  Administered 2018-11-11 – 2018-11-17 (×17): 5000 [IU] via SUBCUTANEOUS
  Filled 2018-11-11 (×16): qty 1

## 2018-11-11 MED ORDER — LIDOCAINE HCL (PF) 1 % IJ SOLN
INTRAMUSCULAR | Status: DC | PRN
  Administered 2018-11-11: 15 mL

## 2018-11-11 MED ORDER — ACETAMINOPHEN 325 MG PO TABS: 650.0000 mg | ORAL_TABLET | ORAL | Status: DC | PRN

## 2018-11-11 MED ORDER — SODIUM CHLORIDE 0.9% FLUSH
3.0000 mL | Freq: Two times a day (BID) | INTRAVENOUS | Status: DC
  Administered 2018-11-11 – 2018-11-16 (×8): 3 mL via INTRAVENOUS

## 2018-11-11 MED ORDER — SODIUM CHLORIDE 0.9 % IV SOLN: INTRAVENOUS | Status: AC

## 2018-11-11 MED ORDER — SODIUM CHLORIDE 0.9 % IV SOLN: 250.0000 mL | INTRAVENOUS | Status: DC | PRN

## 2018-11-11 MED ORDER — LIDOCAINE HCL (PF) 1 % IJ SOLN
INTRAMUSCULAR | Status: AC
  Filled 2018-11-11: qty 30

## 2018-11-11 MED ORDER — HEPARIN (PORCINE) IN NACL 1000-0.9 UT/500ML-% IV SOLN
INTRAVENOUS | Status: AC
  Filled 2018-11-11: qty 1000

## 2018-11-11 MED ORDER — IOHEXOL 350 MG/ML SOLN
INTRAVENOUS | Status: DC | PRN
  Administered 2018-11-11: 30 mL

## 2018-11-11 MED ORDER — HEPARIN (PORCINE) IN NACL 1000-0.9 UT/500ML-% IV SOLN
INTRAVENOUS | Status: DC | PRN
  Administered 2018-11-11 (×2): 500 mL

## 2018-11-11 MED ORDER — SODIUM CHLORIDE 0.9% FLUSH: 3.0000 mL | INTRAVENOUS | Status: DC | PRN

## 2018-11-11 MED ORDER — MIDAZOLAM HCL 2 MG/2ML IJ SOLN
INTRAMUSCULAR | Status: DC | PRN
  Administered 2018-11-11: 1 mg via INTRAVENOUS

## 2018-11-11 MED ORDER — FENTANYL CITRATE (PF) 100 MCG/2ML IJ SOLN
INTRAMUSCULAR | Status: DC | PRN
  Administered 2018-11-11: 25 ug via INTRAVENOUS

## 2018-11-11 MED ORDER — SODIUM CHLORIDE 0.9 % IV SOLN
INTRAVENOUS | Status: AC | PRN
  Administered 2018-11-11: 10 mL/h via INTRAVENOUS

## 2018-11-11 MED ORDER — ONDANSETRON HCL 4 MG/2ML IJ SOLN: 4.0000 mg | Freq: Four times a day (QID) | INTRAMUSCULAR | Status: DC | PRN

## 2018-11-11 MED ORDER — FENTANYL CITRATE (PF) 100 MCG/2ML IJ SOLN
INTRAMUSCULAR | Status: AC
  Filled 2018-11-11: qty 2

## 2018-11-11 MED ORDER — HYDRALAZINE HCL 20 MG/ML IJ SOLN: 10.0000 mg | INTRAMUSCULAR | Status: AC | PRN

## 2018-11-11 MED ORDER — LABETALOL HCL 5 MG/ML IV SOLN: 10.0000 mg | INTRAVENOUS | Status: AC | PRN

## 2018-11-11 SURGICAL SUPPLY — 10 items
CATH DXT MULTI JL4 JR4 ANG PIG (CATHETERS) ×1 IMPLANT
CATH INFINITI 5FR JL5 (CATHETERS) ×1 IMPLANT
CATH SWAN GANZ 7F STRAIGHT (CATHETERS) ×1 IMPLANT
PACK CARDIAC CATHETERIZATION (CUSTOM PROCEDURE TRAY) ×2 IMPLANT
SHEATH PINNACLE 5F 10CM (SHEATH) ×1 IMPLANT
SHEATH PINNACLE 7F 10CM (SHEATH) ×1 IMPLANT
SHEATH PROBE COVER 6X72 (BAG) ×1 IMPLANT
SLEEVE REPOSITIONING LENGTH 30 (MISCELLANEOUS) ×1 IMPLANT
TRANSDUCER W/STOPCOCK (MISCELLANEOUS) ×2 IMPLANT
WIRE EMERALD 3MM-J .035X150CM (WIRE) ×1 IMPLANT

## 2018-11-11 NOTE — Progress Notes (Signed)
  GI consulted for cirrhosis per Dr Aundra Dubin.    [facility]- He has been seen by Dr Paulita Fujita in the past.    Darrick Grinder  NP-C  9:10 AM

## 2018-11-11 NOTE — Progress Notes (Signed)
Patient ID: Keith Nguyen., male   DOB: 08-Jan-1951, 68 y.o.   MRN: [id number]     Advanced Heart Failure Rounding Note  PCP-Cardiologist: No primary care provider on file.   Subjective:    He remains on milrinone 0.375 mcg. No problems yesterday, walked with PT. Creatinine stable around 1.3.   CT chest with moderate emphysema, 4.5 cm ascending aorta.   CT abdomen with cirrhosis, ?gallstone, subtle 10 x 10 mm lesion head/neck pancreas.   MRI abdomen pancreas protocol: No pancreatic lesion, gallstones in gallbladder without cholecystitis.   Limited echo with Definity was reviewed, I do not see LV thrombus.   PFTs with minimal obstruction.   RHC Procedural Findings (milrinone 0.375): Hemodynamics (mmHg) RA mean 14 RV 46/13 PA 49/20, 31 PCWP mean 16 Oxygen saturations: PA 70% AO 93% Cardiac Output (Fick) 5.88  Cardiac Index (Fick) 3.06 PVR 2.55 PAPI 2.07 CVP/PCWP 0.875 Cardiac Output (Thermo) 3.16 Cardiac Index (Thermo) 1.64  RHC/LHC (10/30/2018):  Coronary Findings  Diagnostic Dominance: Left Left Main  No significant disease.  Left Anterior Descending  Luminal irregularities.  Left Circumflex  Large vessel supplies left PDA. Luminal irregularities.  Right Coronary Artery  Small, nondominant vessel. 30% stenosis proximally.  Intervention  No interventions have been documented. Right Heart  Right Heart Pressures RHC Procedural Findings (milrinone 0.375): Hemodynamics (mmHg) RA mean 1 RV 25/1 PA 25/2 mean 15 PCWP mean 8 LV 108/1 AO 108/73  Oxygen saturations: PA 74% AO 96%  Cardiac Output (Fick) 4.81  Cardiac Index (Fick) 2.63      Objective:   Weight Range: 64.9 kg Body mass index is 19.96 kg/m.   Vital Signs:   Temp:  [97.5 F (36.4 C)-98 F (36.7 C)] 98 F (36.7 C) (10/20 0455) Pulse Rate:  [84-101] 86 (10/20 0832) Resp:  [11-54] 15 (10/20 0832) BP: (91-130)/(80-91) 121/86 (10/20 0832) SpO2:  [92 %-100 %] 96 % (10/20 0832) Weight:   [64.9 kg] 64.9 kg (10/20 0455) Last BM Date: 11/09/18  Weight change: Filed Weights   11/09/18 0300 11/10/18 0455 11/04/2018 0455  Weight: 65.1 kg 65.1 kg 64.9 kg    Intake/Output:   Intake/Output Summary (Last 24 hours) at 10/31/2018 0849 Last data filed at 11/12/2018 0600 Gross per 24 hour  Intake 397.58 ml  Output 1975 ml  Net -1577.42 ml      Physical Exam   General: NAD Neck: No JVD, no thyromegaly or thyroid nodule.  Lungs: Clear to auscultation bilaterally with normal respiratory effort. CV: Nondisplaced PMI.  Heart regular S1/S2, no S3/S4, no murmur.  No peripheral edema.   Abdomen: Soft, nontender, no hepatosplenomegaly, no distention.  Skin: Intact without lesions or rashes.  Neurologic: Alert and oriented x 3.  Psych: Normal affect. Extremities: No clubbing or cyanosis.  HEENT: Normal.    Telemetry   NSR 90s. Personally reviewed   Labs    CBC Recent Labs    11/10/18 0531 11/08/2018 0505  WBC 5.0 5.5  NEUTROABS 2.5 2.7  HGB 16.4 16.9  HCT 48.5 47.8  MCV 87.4 85.5  PLT 201 99991111   Basic Metabolic Panel Recent Labs    11/10/18 0531 11/17/2018 0505  NA 129* 127*  K 4.6 4.9  CL 89* 91*  CO2 29 26  GLUCOSE 125* 99  BUN 33* 34*  CREATININE 1.37* 1.38*  CALCIUM 9.7 9.8   Liver Function Tests Recent Labs    11/10/18 0531 11/13/2018 0505  AST 64* 55*  ALT 41 32  ALKPHOS 74 64  BILITOT 1.4* 2.0*  PROT 8.3* 7.5  ALBUMIN 3.3* 3.1*   No results for input(s): LIPASE, AMYLASE in the last 72 hours. Cardiac Enzymes No results for input(s): CKTOTAL, CKMB, CKMBINDEX, TROPONINI in the last 72 hours.  BNP: BNP (last 3 results) Recent Labs    11/16/2018 2019  BNP >4,500.0*    ProBNP (last 3 results) No results for input(s): PROBNP in the last 8760 hours.   D-Dimer No results for input(s): DDIMER in the last 72 hours. Hemoglobin A1C No results for input(s): HGBA1C in the last 72 hours. Fasting Lipid Panel No results for input(s): CHOL, HDL,  LDLCALC, TRIG, CHOLHDL, LDLDIRECT in the last 72 hours. Thyroid Function Tests No results for input(s): TSH, T4TOTAL, T3FREE, THYROIDAB in the last 72 hours.  Invalid input(s): FREET3  Other results:   Imaging    No results found.   Medications:     Scheduled Medications: . [MAR Hold] allopurinol  100 mg Oral Daily  . [MAR Hold] aspirin EC  81 mg Oral Daily  . [MAR Hold] Chlorhexidine Gluconate Cloth  6 each Topical Daily  . [MAR Hold] digoxin  0.125 mg Oral Daily  . [MAR Hold] feeding supplement (ENSURE ENLIVE)  237 mL Oral TID WC  . [MAR Hold] heparin injection (subcutaneous)  5,000 Units Subcutaneous Q8H  . [MAR Hold] isosorbide-hydrALAZINE  1 tablet Oral TID  . [MAR Hold] loratadine  10 mg Oral Daily  . [MAR Hold] mouth rinse  15 mL Mouth Rinse BID  . [MAR Hold] metoprolol succinate  25 mg Oral Daily  . [MAR Hold] sodium chloride flush  10-40 mL Intracatheter Q12H  . [MAR Hold] sodium chloride flush  3 mL Intravenous Q12H  . [MAR Hold] sodium chloride flush  3 mL Intravenous Q12H  . [MAR Hold] spironolactone  25 mg Oral Daily    Infusions: . sodium chloride    . sodium chloride 10 mL/hr at 11/02/2018 0610  . milrinone 0.375 mcg/kg/min (11/06/2018 0453)    PRN Medications: sodium chloride, [MAR Hold] acetaminophen **OR** [MAR Hold] acetaminophen, [MAR Hold] albuterol, [MAR Hold] methocarbamol, [MAR Hold] nitroGLYCERIN, [MAR Hold] ondansetron **OR** [MAR Hold] ondansetron (ZOFRAN) IV, [MAR Hold] polyethylene glycol, [MAR Hold] sodium chloride, [MAR Hold] sodium chloride flush, sodium chloride flush   Assessment/Plan   1. Acute on chronic systolic CHF: Nonischemic cardiomyopathy diagnosed in 2017 with echo showing EF 40% and LHC showing mild nonobstructive CAD.  He saw Dr. Wynonia Lawman in the past, but no cardiology evaluation since 2017.  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  He has drunk moderate ETOH in the past (no longer drinks) but does not seem to have drunk enough to cause a cardiomyopathy.  He was markedly volume overloaded on exam initially with biventricular failure at admission with rise in creatinine concerning for cardiorenal syndrome. Initial co-ox 40% suggested low CO, milrinone started and increased to 0.375.  He was begun on Lasix gtt at 12 mg/hr and later stopped.  RHC showed R>L heart failure.  Cardiac output looked good on milrinone by Fick but was low by thermodilution (discordant values).  Repeat LHC/RHC today, very low filling pressures and good cardiac index by Fick.  No significant coronary disease noted.   - Will give IV fluid post cath with low filling pressures. No Lasix.  - Continue milrinone 0.375 mcg/kg/min.     - Continue  digoxin, level ok today.  - Continue Bidil 1 tab tid.  - Continue spironolactone 25 mg daily.   - Continue Toprol XL to 25 mg daily with volume overload and low output (also with low HR at times).  - We are working him up for LVAD.  CVP remains low, suspect his RV would support LVAD.  However, he also has cirrhosis which will be a consideration.  INR was 1.2 and albumen 3.2 so probably not severe liver dysfunction.  PFTs with minimal obstruction despite smoking history.  He is from [place] but would stay with his daughter in [place]. There was question of pancreatic lesion but pancreatic protocol MRI showed no lesion.  Will have GI assess him prior to LVAD placement for cirrhosis evaluation.  Anticipate LVAD placement later this admission.   2. AKI on CKD stage 3: Suspect this may be a combination of cardiorenal syndrome and contrast nephropathy (had CTA chest at admission).  Creatinine 1.3 today with milrinone support. He got minimal contrast (25-30 cc) with cath today, will give IV fluid post-procedure.   - Continue to support CO with milrinone.  3. 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  He was drinking moderate ETOH as well but has quit completely for > 1 month. INR 1.2, albumen 3.2. NH3 was normal.  - As above, will ask GI to consult regarding severity of his cirrhosis prior to LVAD placement.  4. COPD/smoking: He recently quit smoking.  CT chest with moderate emphysema. He is now off oxygen. Minimal obstruction on PFTs.  5. Confusion:  Completely resolved.  Had overnight 10/14-15.  NH3 was normal, doubt hepatic encephalopathy.  No recent ETOH, not due to ETOH withdrawal.  Think may have been due to low output and hospitalization.  CT head unremarkable.  6. Hyponatremia: Need to fluid restrict, 1500 cc.  7. Aortic insufficiency: ?Moderate range.   Will decide on surgical timing with TCTS after GI evaluation.   Length of Stay: 7  Loralie Champagne, MD  11/12/2018, 8:49 AM  Advanced Heart Failure Team Pager [phone] (M-F; 7a - 4p)  Please contact [facility] for night-coverage after hours (4p -7a ) and weekends on [email or web address]

## 2018-11-11 NOTE — Interval H&P Note (Signed)
History and Physical Interval Note:  11/12/2018 8:41 AM  Keith Nguyen.  has presented today for surgery, with the diagnosis of CHF.  The various methods of treatment have been discussed with the patient and family. After consideration of risks, benefits and other options for treatment, the patient has consented to  Procedure(s): RIGHT/LEFT HEART CATH AND CORONARY ANGIOGRAPHY (N/A) as a surgical intervention.  The patient's history has been reviewed, patient examined, no change in status, stable for surgery.  I have reviewed the patient's chart and labs.  Questions were answered to the patient's satisfaction.     Florence Antonelli [employer]

## 2018-11-11 NOTE — Progress Notes (Signed)
LVAD Initial Psychosocial Screening  Date/Time Initiated:  11-10-18 2:50pm Referral Source: Tanda Rockers, VAD Coordinator Referral Reason:  LVAD Implantation Source of Information:  Pt, wife and chart review  Demographics Name:  Keith Nguyen Address:  [address] [place] [address] Home phone: [phone]    Cell:  Marital Status:  Married  Faith:  [employer] Primary Language:  English DOB:  07-10-1950  Medical & Follow-up Adherence to Medical regimen/INR checks: compliant  Medication adherence: compliant  Physician/Clinic Appointment Attendance: compliant Comments:    Advance Directives: Do you have a Living Will or Medical POA? No  Would you like to complete a Living Will and Medical POA prior to surgery?  Yes discussed with Palliative Care Do you have Goals of Care? Yes  Have you had a consult with the Palliative Care Team at [facility]? Yes  Psychological Health Appearance:  In hospital gown Mental Status:  Alert, oriented Eye Contact:  Good Thought Content:  Coherent Speech:  Logical/coherent Mood:  Appropriate and Positive  Affect:  Appropriate to circumstance Insight:  Good Judgement: Unimpaired Interaction Style:  Engaged and Positive  Family/Social Information Who lives in your home? Name:   Relationship:   Stanton Kidney    Wife  Other family members/support persons in your life? Name:   Relationship:   Gerlene Fee Daughter Shawn   Son Rocco Pauls   Son  Caregiving Needs Who is the primary caregiver? [place] status:  good Do you drive?  yes Do you work?  yes Physical Limitations:  none Do you have other care giving responsibilities?  none Contact number: [phone]  Who is the secondary caregiver? [address] status:  good Do you drive?  yes Do you work?  yes Physical Limitations:  none Do you have other care giving responsibilities?  none Contact number: [phone]  Home Environment/Personal Care Do you have reliable  phone service? Yes verizon  Do you own or rent your home? Own (patient will return to [facility] home in [place]) Number of steps into the home? 1 step How many levels in the home? 1  Assistive devices in the home? none Electrical needs for LVAD (3 prong outlets)? yes Second hand smoke exposure in the home? none Travel distance from [facility]? 20 minutes   Community Are you active with community agencies/resources/homecare? No Agency Name: n/a Are you active in a church, synagogue, mosque or other faith based community? No Faith based institutions name: n/a What other sources do you have for spiritual support? none Are you active in any clubs or social organizations? none What do you do for fun?  Hobbies?  Interests? Watch football  Education/Work Information What is the last grade of school you completed?  HS diploma Preferred method of learning?  Hands on Do you have any problems with reading or writing?  No Are you currently employed?  No  When were you last employed? 2001  Name of employer?  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 No Are you interested in job training or learning new skills?  No Did you serve in the [employer]? No  If so, what branch? Other  Financial Information What is your source of income? SSA Do you have difficulty meeting your monthly expenses? No Can you budget for the monthly cost for dressing supplies post procedure? Yes  Primary Health insurance:  [employer] Secondary Insurance: Medicare Prescription plan: Express [facility] What are your prescription co-pays? varies Do you use mail order for your prescriptions?  Yes Have you ever had to refuse medication due to cost?  No Have you applied for Medicaid?  n/a Have you applied for Social Security Disability (SSI)  N/a  Medical  Information Briefly describe why you are here for evaluation: Patient reports it started about 3-4 months ago and he thought he had COPD. Do you have a PCP or other medical provider? [name], MD Are you able to complete your ADL's? yes Do you have a history of trauma, physical, emotional, or sexual abuse? no Do you have any family history of heart problems? brothers Do you smoke now or past usage? past usage    Quit date: 2 months ago Do you drink alcohol now or past usage? Yes    Quit date:  States he stopped 2-3 weeks ago because he wasn't feeling well. Typically drinks 3-4 weekly- 2-4 drinks at a time- wine/ beer and hard liquor Are you currently using illegal drugs or misuse of medication or past usage? past usage He admits to using cocaine 20 years and used crack one year ago for a brief time  Have you ever been treated for substance abuse? No      If yes, where and when did you receive treatment?  Mental Health History How have you been feeling in the past year? Pretty good until a few months ago Have you ever had any problems with depression, anxiety or other mental health issues? none Do you see a counselor, psychiatrist or therapist?   no If you are currently experiencing problems are you interested in talking with a professional? No Have you or are you taking medications for anxiety/depression or any mental health concerns?  No  Current Medications:  n/a What are your coping strategies under stressful situations? "deal with it" "try and not let it bother me" Are there any other stressors in your life? none Have you had any past or current thoughts of suicide? no How many hours do you sleep at night? varies How is your appetite? Comes and goes Would you be interested in attending the LVAD support group? yes  PHQ2 Depression Scale: 1  Legal Do you currently have any legal issues/problems?  no Have you had any legal issues/problems in the past?   no Do you have a Durable  POA?  no  Plan for VAD Implementation Do you know and understand what happens during the VAD surgery? Patient Verbalizes Understanding  of surgery and able to describe details What do you know about the risks and side effect associated with VAD surgery? Patient Verbalizes Understanding  of risks (infection, stroke and death) Explain what will happen right after surgery: Patient Verbalizes Understanding  of OR to ICU and will be intubated What is your plan for transportation for the first 8 weeks post-surgery? (Patients are not recommended to drive post-surgery for 8 weeks)  Driver:    Wife and daughter Do you have airbags in your vehicle?  There is a risk of discharging the device if the airbag were to deploy. What do you know about your diet post-surgery? Patient Verbalizes Understanding  of Heart healthy How do you plan to monitor your medications, current and future?   Take from the bottles How do you plan to complete ADL's post-surgery?  yes Will it be difficult to ask for help from your caregivers?  Will have wife assist as needed  Please explain what you hope will be improved about your life as a result of receiving the LVAD? "eating healthy and gain the strength to do things again" Please tell me your biggest concern or fear about living with the LVAD?  No fears How do you cope with your concerns and fears?   Try to not let it bother me Please explain your understanding of how their body will change?  Patient verbalizes understanding of driveline Are you worried about these changes? No I can deal with that Do you see any barriers to your surgery or follow-up? None  Understanding of LVAD Patient states understanding of the following: Surgical procedures and risks, Electrical need for LVAD (3 prong outlets), Safety precautions with LVAD (water, etc.), LVAD daily self-care (dressing changes, computer check, extra supplies), Outpatient follow up (LVAD clinic appts, monitoring blood  thinners) and Need for Emergency Planning  Discussed and Reviewed with Patient and Caregiver  Patient's current level of motivation to prepare for LVAD: motivated wit good support Patient's present Level of Consent for LVAD:  Ready   Education provided to patient/family/caregiver:   Caregiver role and responsibiltiy, Financial planning for LVAD, Role of Clinical Social Worker and Signs of Depression and Anxiety   Caregiver questions Please explain what you hope will be improved about your life and loved one's life as a result of receiving the LVAD?  Improved health and enjoy his grand kids What is your biggest concern or fear about caregiving with an LVAD patient?  Making sure I do everything right What is your plan for availability to provide care 24/7 x2 weeks post op and dressing changes ongoing?  Wife and Daughter will take FMLA Who is the relief/backup caregiver and what is their availability?  Daughter and wife will share dual primary role Preferred method of learning? Hands on  Do you drive? yes How do you handle stressful situations?  I don't get angry Do you think you can do this?  yes Is there anything that concerns about caregiving?  no Do you provide caregiving to anyone else?  no   Caregiver's current level of motivation to prepare for LVAD: highly motivated Caregiver's present level of consent for LVAD:  Ready  Clinical Interventions Needed:    CSW will provide support around issues of substance use recovery and offer resources as needed post implant hospitalization CSW will monitor signs and symptoms of depression and assist with adjustment to life with an LVAD and encouraged attendance with the LVAD Support Group to assist further with adjustment and post implant peer support.  Clinical Impressions/Recommendations:   Mr Lumpkin is a 68 yo male who has been married for 46 years and has 2 adult children and one adopted son. 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 He receives SSA retirement and has [employer] and [employer].  He currently resides in [place] with his wife and due to recent health issues they are planning to move to [place] to be closer to their daughter. Patient's wife is currently working full time but exploring options for retirement or possible transfer to [place] as she is a Therapist, sports with a dialysis center. He denies any faith based community or any social clubs. He states he enjoys watching football and other sports as his hobby. He completed HS and prefers hands on learning. He denies any history of trauma, physical, emotional or sexual abuse. He does admit to  tobacco use for the past 35 years and quit about 2 months ago because of his health. He also admits to alcohol use and drank 2-4 drinks 3-4 x weekly. Family report he "was a problem in the past" he states his last drink was 2-3 weeks ago and quit because of his declining health. He admits to past use of cocaine over 20 years ago and a brief use of crack over a year ago. He denies any mental health concerns and scored a 1 on the PHQ-2. He states that his "just deals with it" under stressful conditions. He hopes to eat healthy and have improved strength after getting the LVAD. He denies any concerns with body image stating that "I can deal with that". Patient appears to be a good candidate for LVAD implantation and is agreeable to the above interventions post implant.   Melba Coon, [place]

## 2018-11-11 NOTE — Progress Notes (Signed)
PROGRESS NOTE    Keith Nguyen.  BN:[id number]  DOB: May 05, 1950  DOA: 10/27/2018 PCP: [name], MD  Brief Narrative:  68 y/o m with h/o Chronic systolic CHF EF 123456 ,HTN, CKD-3, Hep C/cirrhosis-Rx with [facility], COPD, tobacco/alcohol use (says quit last month) who currently lives in [place], [place] (Last seen by cardiology Dr Wynonia Lawman in 2017) and visiting daughter in [place] presented to the ED on 10/12 with c/o progressive dyspnea associated with non productive cough and fluid retention x 3 days.Patient on lasix at home and reports compliance. ED Course:In the ER on exam patient has elevated JVD with poor air entry noted on lung exam. BNP of more than 4500 high-sensitivity troponin was 65 and 60.  His d-dimer was elevated, CT angiogram of the chest was done which is negative for pulmonary embolism but did reveal,moderate emphysema, 4.5 cm ascending aorta, anasarca with moderate right and small left pleural effusion.Patient was given Lasix 40 mg IV in the ER and admitted to Hospitalist service with cardiology consultation.  Hospital course: Patient evaluated by cardiology for acute systolic CHF exacerbation, managed with Lasix drip as well as Milrinone gtt.Echo this admission with EF 20-25%, possible LV noncompaction, moderate RV dysfunction. Initially there was rise in creatinine concerning for cardiorenal syndrome/contrast nephropathy but has since improved with milrinone support.He required 02 in the initial hospital course and currently tapered down--now using intermittently for O2 sat drop to 88% during sleep. Walking saturations okay. ACEI and lasix on hold but receiving aldactone/digoxin/b-blockers/nitrates. LH cath/PFTS pending for LVAD work up.RHC showed R>L heart failure.  Cardiac output looked good on milrinone by Fick but was low by thermodilution (discordant values).Previous cath in 2017-non obstructive CAD.  Hospital course also complicated by delirium/hyponatremia  Subjective:  Patient appears comfortable. Underwent LHC today and tolerated well. Saturating well on RA. Remains on Milrinone drip. Wife bedside  Objective: Vitals:   10/25/2018 1100 11/09/2018 1227 11/16/2018 1608 11/05/2018 1614  BP:  124/84 115/90   Pulse:  89  99  Resp: (!) 23 20  16   Temp:      TempSrc:      SpO2:  95%  91%  Weight:      Height:        Intake/Output Summary (Last 24 hours) at 11/10/2018 1714 Last data filed at 11/21/2018 1600 Gross per 24 hour  Intake 1002.43 ml  Output 2075 ml  Net -1072.57 ml   Filed Weights   11/09/18 0300 11/10/18 0455 10/26/2018 0455  Weight: 65.1 kg 65.1 kg 64.9 kg    Physical Examination:  General exam: Appears calm and comfortable  Respiratory system: Clear to auscultation. Respiratory effort normal. Cardiovascular system: S1 & S2 heard, RRR. No JVD, murmurs. No pedal edema. Gastrointestinal system: Abdomen is nondistended, soft and nontender. No organomegaly or masses felt. Normal bowel sounds heard. Central nervous system: Alert and oriented. No focal neurological deficits. Extremities: Symmetric 5 x 5 power. Skin: No rashes, lesions or ulcers Psychiatry: Judgement and insight appear normal. Mood & affect appropriate.     Data Reviewed: I have personally reviewed following labs and imaging studies  CBC: Recent Labs  Lab 11/07/18 0420 11/08/18 0415 11/09/18 0410 11/10/18 0531 11/03/2018 0505  WBC 5.0 5.4 5.3 5.0 5.5  NEUTROABS 3.0 2.8 2.7 2.5 2.7  HGB 16.1 16.3 17.2* 16.4 16.9  HCT 47.7 48.3 49.0 48.5 47.8  MCV 87.4 86.7 86.7 87.4 85.5  PLT 220 216 222 201 [id number]   Basic Metabolic Panel: Recent Labs  Lab 11/05/2018 1807  11/07/18 0420 11/08/18 0415 11/08/18 0757 11/09/18 0410 11/10/18 0531 11/20/2018 0505  NA  --  130* 124*  --  129* 129* 127*  K  --  3.3* 3.7  --  4.3 4.6 4.9  CL  --  86* 79*  --  86* 89* 91*  CO2  --  31 32  --  31 29 26   GLUCOSE  --  115* 124*  --  104* 125* 99  BUN  --  31* 32*  --  29* 33* 34*   CREATININE  --  1.76* 1.42*  --  1.33* 1.37* 1.38*  CALCIUM  --  9.5 9.3  --  9.4 9.7 9.8  MG 1.8  --  19.7* 2.1  --   --   --    GFR: Estimated Creatinine Clearance: 46.4 mL/min (A) (by C-G formula based on SCr of 1.38 mg/dL (H)). Liver Function Tests: Recent Labs  Lab 11/08/18 0415 11/09/18 0410 11/10/18 0531 11/01/2018 0505  AST 104* 93* 64* 55*  ALT 57* 50* 41 32  ALKPHOS 71 74 74 64  BILITOT 1.4* 1.3* 1.4* 2.0*  PROT 7.4 8.2* 8.3* 7.5  ALBUMIN 3.0* 3.2* 3.3* 3.1*   Recent Labs  Lab 11/07/18 1645  LIPASE 35  AMYLASE 176*   Recent Labs  Lab 11/05/2018 0829  AMMONIA 30   Coagulation Profile: Recent Labs  Lab 11/04/2018 1056 11/08/18 0415  INR 1.7* 1.2   Cardiac Enzymes: No results for input(s): CKTOTAL, CKMB, CKMBINDEX, TROPONINI in the last 168 hours. BNP (last 3 results) No results for input(s): PROBNP in the last 8760 hours. HbA1C: No results for input(s): HGBA1C in the last 72 hours. CBG: Recent Labs  Lab 11/05/18 2006  GLUCAP 123*   Lipid Profile: No results for input(s): CHOL, HDL, LDLCALC, TRIG, CHOLHDL, LDLDIRECT in the last 72 hours. Thyroid Function Tests: No results for input(s): TSH, T4TOTAL, FREET4, T3FREE, THYROIDAB in the last 72 hours. Anemia Panel: No results for input(s): VITAMINB12, FOLATE, FERRITIN, TIBC, IRON, RETICCTPCT in the last 72 hours. Sepsis Labs: No results for input(s): PROCALCITON, LATICACIDVEN in the last 168 hours.  Recent Results (from the past 240 hour(s))  SARS CORONAVIRUS 2 (TAT 6-24 HRS) Nasopharyngeal Nasopharyngeal Swab     Status: None   Collection Time: 11/04/18 12:29 AM   Specimen: Nasopharyngeal Swab  Result Value Ref Range Status   SARS Coronavirus 2 NEGATIVE NEGATIVE Final    Comment: (NOTE) SARS-CoV-2 target nucleic acids are NOT DETECTED. The SARS-CoV-2 RNA is generally detectable in upper and lower respiratory specimens during the acute phase of infection. Negative results do not preclude SARS-CoV-2  infection, do not rule out co-infections with other pathogens, and should not be used as the sole basis for treatment or other patient management decisions. Negative results must be combined with clinical observations, patient history, and epidemiological information. The expected result is Negative. Fact Sheet for Patients: SugarRoll.be Fact Sheet for Healthcare Providers: https://www.woods-mathews.com/ This test is not yet approved or cleared by the [place] FDA and  has been authorized for detection and/or diagnosis of SARS-CoV-2 by FDA under an Emergency Use Authorization (EUA). This EUA will remain  in effect (meaning this test can be used) for the duration of the COVID-19 declaration under Section 56 4(b)(1) of the Act, 21 U.S.C. section 360bbb-3(b)(1), unless the authorization is terminated or revoked sooner. Performed at [facility], [place] [address], [place], [place] [address]   MRSA PCR Screening     Status: None  Collection Time: 11/05/18  2:15 PM   Specimen: Nasal Mucosa; Nasopharyngeal  Result Value Ref Range Status   MRSA by PCR NEGATIVE NEGATIVE Final    Comment:        The GeneXpert MRSA Assay (FDA approved for NASAL specimens only), is one component of a comprehensive MRSA colonization surveillance program. It is not intended to diagnose MRSA infection nor to guide or monitor treatment for MRSA infections. Performed at [facility], [place] [address], [place], [place] [address]   Surgical pcr screen     Status: None   Collection Time: 11/10/18  9:55 AM   Specimen: Nasal Mucosa; Nasal Swab  Result Value Ref Range Status   MRSA, PCR NEGATIVE NEGATIVE Final   Staphylococcus aureus NEGATIVE NEGATIVE Final    Comment: (NOTE) The Xpert SA Assay (FDA approved for NASAL specimens in patients 3 years of age and older), is one component of a comprehensive surveillance program. It is not intended  to diagnose infection nor to guide or monitor treatment. Performed at [facility], [place] [address], [place], [place] [address]       Radiology Studies: No results found.      Scheduled Meds: . allopurinol  100 mg Oral Daily  . aspirin EC  81 mg Oral Daily  . Chlorhexidine Gluconate Cloth  6 each Topical Daily  . digoxin  0.125 mg Oral Daily  . feeding supplement (ENSURE ENLIVE)  237 mL Oral TID WC  . heparin  5,000 Units Subcutaneous Q8H  . isosorbide-hydrALAZINE  1 tablet Oral TID  . loratadine  10 mg Oral Daily  . mouth rinse  15 mL Mouth Rinse BID  . metoprolol succinate  25 mg Oral Daily  . sodium chloride flush  10-40 mL Intracatheter Q12H  . sodium chloride flush  3 mL Intravenous Q12H  . sodium chloride flush  3 mL Intravenous Q12H  . sodium chloride flush  3 mL Intravenous Q12H  . spironolactone  25 mg Oral Daily   Continuous Infusions: . sodium chloride    . milrinone 0.375 mcg/kg/min (11/20/2018 1609)    Assessment & Plan:    1. Acute on chronic systolic CHF with acute hypoxic respiratory failure: POA. Being managed by cardiology mostly. Remains on Milrinone drip. Continue asa, digoxin, spironolactone, lower dose metoprolol, BiDil per CHF team. Losartan and Lasix remain on hold. S/P RHC last week and LHC /coronary eval today. Off O2 , pulsox good except during sleep. Ct screening revealed emphysema/liver cirrhosis- PFts (given h/o smoking) and GI eval requested by cards as part of LVAD w/u.  2. Cardiorenal syndrome with  AKI on CKD-3: Creat now improved and stable around 1.3 with ionotropes. Losartan/lasix on hold as discussed above  3.  Hep C/Liver Cirrhosis: Lasix on hold. No evidence of ascitis currently and mentating well.  Hep C RNA quant confirms Hep C clearance (undetected) as part of LVAD work up. Continue aldactone  4.  HTN: BP controlled on current regimen.   5. Hyponatremia: asymptomatic, improved and stable (134-->130-->124->129).  ?Hypervolemic hyponatremia vs diuretic induced (on aldactone)  6. Delirium: resolved. Likely med induced per prior notes. Currently AAO X3.   7. ?Sleep apnea: Pulsox dropping to 88% only during sleep. Does not have the typical body habitus for OSA. Will need sleep study at some point. May need O2 set up for home use during sleep  8. Severe PCM : patient has hypoalbuminemia in the setting of #3. Seen by dietician.   DVT  prophylaxis: Heparin Code Status: Full Family / Patient Communication: d/w patient, all qs answered to satisfaction Disposition Plan: Home when medically cleared by Cards. Remains on stepdown floor due to milrinone drip     LOS: 7 days    Time spent: 35 minutes    Guilford Shi, MD [facility] Pager [phone]  If 7PM-7AM, please contact night-coverage [email or web address] Password [email or web address] 11/03/2018, 5:14 PM

## 2018-11-11 NOTE — Progress Notes (Signed)
Site area: right groin  Site Prior to Removal:  Level 0  Pressure Applied For 17  MINUTES    Minutes Beginning at 0915  Manual:   Yes.    Patient Status During Pull:  Stable   Post Pull Groin Site:  Level 0  Post Pull Instructions Given:  Yes.    Post Pull Pulses Present:  Yes.    Dressing Applied:  Yes.    Comments:  Bed rest started at 0935 x 4 hr.

## 2018-11-12 ENCOUNTER — Telehealth (HOSPITAL_COMMUNITY): Payer: Self-pay | Admitting: Licensed Clinical Social Worker

## 2018-11-12 DIAGNOSIS — I5021 Acute systolic (congestive) heart failure: Secondary | ICD-10-CM

## 2018-11-12 DIAGNOSIS — I5023 Acute on chronic systolic (congestive) heart failure: Secondary | ICD-10-CM | POA: Diagnosis not present

## 2018-11-12 LAB — CBC WITH DIFFERENTIAL/PLATELET
Abs Immature Granulocytes: 0.01 10*3/uL (ref 0.00–0.07)
Basophils Absolute: 0 10*3/uL (ref 0.0–0.1)
Basophils Relative: 1 %
Eosinophils Absolute: 0.1 10*3/uL (ref 0.0–0.5)
Eosinophils Relative: 2 %
HCT: 45.2 % (ref 39.0–52.0)
Hemoglobin: 15.7 g/dL (ref 13.0–17.0)
Immature Granulocytes: 0 %
Lymphocytes Relative: 28 %
Lymphs Abs: 1.4 10*3/uL (ref 0.7–4.0)
MCH: 30.3 pg (ref 26.0–34.0)
MCHC: 34.7 g/dL (ref 30.0–36.0)
MCV: 87.1 fL (ref 80.0–100.0)
Monocytes Absolute: 0.7 10*3/uL (ref 0.1–1.0)
Monocytes Relative: 14 %
Neutro Abs: 2.6 10*3/uL (ref 1.7–7.7)
Neutrophils Relative %: 55 %
Platelets: 161 10*3/uL (ref 150–400)
RBC: 5.19 MIL/uL (ref 4.22–5.81)
RDW: 15.3 % (ref 11.5–15.5)
WBC: 4.8 10*3/uL (ref 4.0–10.5)
nRBC: 0 % (ref 0.0–0.2)

## 2018-11-12 LAB — COMPREHENSIVE METABOLIC PANEL WITH GFR
ALT: 29 U/L (ref 0–44)
AST: 46 U/L — ABNORMAL HIGH (ref 15–41)
Albumin: 2.9 g/dL — ABNORMAL LOW (ref 3.5–5.0)
Alkaline Phosphatase: 70 U/L (ref 38–126)
Anion gap: 9 (ref 5–15)
BUN: 30 mg/dL — ABNORMAL HIGH (ref 8–23)
CO2: 26 mmol/L (ref 22–32)
Calcium: 9.3 mg/dL (ref 8.9–10.3)
Chloride: 96 mmol/L — ABNORMAL LOW (ref 98–111)
Creatinine, Ser: 1.13 mg/dL (ref 0.61–1.24)
GFR calc Af Amer: >60 mL/min
GFR calc non Af Amer: >60 mL/min
Glucose, Bld: 100 mg/dL — ABNORMAL HIGH (ref 70–99)
Potassium: 4.6 mmol/L (ref 3.5–5.1)
Sodium: 131 mmol/L — ABNORMAL LOW (ref 135–145)
Total Bilirubin: 1.8 mg/dL — ABNORMAL HIGH (ref 0.3–1.2)
Total Protein: 7.6 g/dL (ref 6.5–8.1)

## 2018-11-12 LAB — FACTOR 5 LEIDEN

## 2018-11-12 LAB — COOXEMETRY PANEL
Carboxyhemoglobin: 1.1 % (ref 0.5–1.5)
Methemoglobin: 0.5 % (ref 0.0–1.5)
O2 Saturation: 73.3 %
Total hemoglobin: 15.8 g/dL (ref 12.0–16.0)

## 2018-11-12 MED ORDER — THIAMINE HCL 100 MG/ML IJ SOLN
100.0000 mg | Freq: Every day | INTRAMUSCULAR | Status: DC
  Administered 2018-11-13 – 2018-11-16 (×4): 100 mg via INTRAVENOUS
  Filled 2018-11-12 (×4): qty 2

## 2018-11-12 NOTE — Progress Notes (Signed)
Subjective:  No complaints  Objective:  Vital Signs in the last 24 hours: Temp:  [97.5 F (36.4 C)-99 F (37.2 C)] 98.7 F (37.1 C) (10/21 0749) Pulse Rate:  [69-101] 101 (10/21 0749) Resp:  [9-23] 16 (10/21 0749) BP: (107-163)/(78-90) 122/80 (10/21 0749) SpO2:  [91 %-100 %] 97 % (10/21 0749) Weight:  [66 kg-66.5 kg] 66 kg (10/21 0509)  Intake/Output from previous day: 10/20 0701 - 10/21 0700 In: 1139.5 [P.O.:290; I.V.:849.5] Out: 1725 [Urine:1725] Intake/Output from this shift: No intake/output data recorded.  Physical Exam: General appearance: alert and cooperative Neck: no adenopathy, no carotid bruit, no JVD, supple, symmetrical, trachea midline and thyroid not enlarged, symmetric, no tenderness/mass/nodules Heart: regular rate and rhythm, S1, S2 normal, no murmur, click, rub or gallop Extremities: edema none detected Neurologic: Alert and oriented X 3, normal strength and tone. Normal symmetric reflexes. Normal coordination and gait  Lab Results: Recent Labs    11/06/2018 0505 10/31/2018 0818 11/12/18 0458  WBC 5.5  --  4.8  HGB 16.9 17.7*  17.7* 15.7  PLT 186  --  161   Recent Labs    11/05/2018 0505 11/13/2018 0818 11/12/18 0458  NA 127* 130*  131* 131*  K 4.9 4.3  4.3 4.6  CL 91*  --  96*  CO2 26  --  26  GLUCOSE 99  --  100*  BUN 34*  --  30*  CREATININE 1.38*  --  1.13   No results for input(s): TROPONINI in the last 72 hours.  Invalid input(s): CK, MB Hepatic Function Panel Recent Labs    11/12/18 0458  PROT 7.6  ALBUMIN 2.9*  AST 46*  ALT 29  ALKPHOS 70  BILITOT 1.8*   No results for input(s): CHOL in the last 72 hours. No results for input(s): PROTIME in the last 72 hours.  Imaging: Imaging results have been reviewed  Cardiac Studies:  Assessment/Plan:  Cardiomyopathy  Discussed in VAD MRB and we are in agreement that he would benefit from durable LVAD given underlying LV dysfunction and satisfactory hemodynamic response to milrinone  gtt. His MELD score is relatively low, not suggesting that intrinsic liver disease is prohibitive to surgery, pending GI consultation. Plan HeartMate 3 LVAD implant in next few days.   LOS: 8 days    Wonda Olds 11/12/2018, 8:46 AM

## 2018-11-12 NOTE — Progress Notes (Signed)
CARDIAC REHAB PHASE I   PRE:  Rate/Rhythm: 108 ST  BP:  Supine:   Sitting: 119/88  Standing:    SaO2: 99% 2L  MODE:  Ambulation: 850 ft 6 minute walk test  POST:  Rate/Rhythm: 112 ST  BP:  Supine:   Sitting: 113/89  Standing:    SaO2: 97%RA 0905-0937 Pt able to walk 850 ft in 6 minutes without having to stop and rest. Stated breathing felt good during walk. Sats good on RA. Left off oxygen after walk. Oxygen in reach if needed. Pt has steady gait and tolerated well. Gave IS and pt can reach 1000 ml correctly. Discussed that he will be using this after surgery.    Graylon Good, RN BSN  11/12/2018 9:30 AM

## 2018-11-12 NOTE — Progress Notes (Signed)
CSW Met at bedside with patient. Patient reports he is ready for LVAD implantation and awaiting final surgery date. CSW and patient discussed the importance of not drinking post LVAD. CSW offered resources as needed for alcohol dependency and supportive services in the community. Patient verbalizes understanding of importance and denies any concerns at this time. CSW will reach out to patient's daughter via phone to follow up on caregiver role. CSW continues to follow for LVAD implant. Raquel Sarna, [place], [place]

## 2018-11-12 NOTE — Telephone Encounter (Signed)
CSW contacted patient's daughter Stephani Police to discuss caregiver role. CSW spoke at length about role and responsibilities of the caregiver. Daughter asked appropriate questions and appears very supportive. Daughter did share that the alcohol use was an issue in the past and was grateful for the support from the team in addressing with her dad. She states that it is "helpful to all be on the same page with this issue". Daughter asked if her husband Minna Merritts could also participate in the training as well since he will also be in the home environment. Daughter verbalizes understanding of caregiver role and agreeable to plan. CSW provided needed contact information and will continue to be available throughout implant hospitalization. Raquel Sarna, [place], [place]

## 2018-11-12 NOTE — Progress Notes (Signed)
Physical Therapy Treatment & Discharge Patient Details Name: Keith Nguyen. MRN: [id number] DOB: 01/08/1951 Today's Date: 11/12/2018    History of Present Illness Pt is a 68 y.o. male admitted 11/05/2018 with SOB, worked up for CHF. S/p cardiac cath 10/15 and 10/20. Pt being worked up for [place] placement. PMH includes Hep C, HTN, HLD.   PT Comments    Pt independent with ambulation and ADL tasks; VSS. Initiated education regarding LVAD and sternal precautions. Pt in good spirits with good support system. Encouraged continued ambulation with cardiac rehab and nursing staff. Will d/c acute PT.  If pt to proceed with LVAD implantation, please reorder for post-op mobility and d/c planning.     Follow Up Recommendations  No PT follow up     Equipment Recommendations  None recommended by PT    Recommendations for Other Services       Precautions / Restrictions Precautions Precautions: None Precaution Comments: Initiated sternal precautions education (handout provided) Restrictions Weight Bearing Restrictions: No    Mobility  Bed Mobility Overal bed mobility: Independent                Transfers Overall transfer level: Independent Equipment used: None Transfers: Sit to/from [employer] transfer comment: Indep to stand with hands on knees as if maintaining sternal precautions  Ambulation/Gait Ambulation/Gait assistance: Independent Gait Distance (Feet): 500 Feet Assistive device: None;IV Pole Gait Pattern/deviations: WFL(Within Functional Limits)   Gait velocity interpretation: 1.31 - 2.62 ft/sec, indicative of limited community Metallurgist Rankin (Stroke Patients Only)       Balance Overall balance assessment: No apparent balance deficits (not formally assessed)                                          Cognition Arousal/Alertness: Awake/alert Behavior  During Therapy: WFL for tasks assessed/performed Overall Cognitive Status: Within Functional Limits for tasks assessed                                        Exercises      General Comments General comments (skin integrity, edema, etc.): Initiated LVAD and sternal precautions education. Encouraged pt to continue ambulating with nursing staff and cardiac rehab; PT to be reordered post-op LVAD if pt to proceed with sx      Pertinent Vitals/Pain Pain Assessment: No/denies pain    Home Living                      Prior Function            PT Goals (current goals can now be found in the care plan section) Progress towards PT goals: Goals met/education completed, patient discharged from PT    Frequency    Min 3X/week      PT Plan Current plan remains appropriate    Co-evaluation              AM-PAC PT "6 Clicks" Mobility   Outcome Measure  Help needed turning from your back to your side while in a flat bed without using bedrails?: None Help needed moving from lying  on your back to sitting on the side of a flat bed without using bedrails?: None Help needed moving to and from a bed to a chair (including a wheelchair)?: None Help needed standing up from a chair using your arms (e.g., wheelchair or bedside chair)?: None Help needed to walk in hospital room?: None Help needed climbing 3-5 steps with a railing? : None 6 Click Score: 24    End of Session   Activity Tolerance: Patient tolerated treatment well Patient left: in chair;with call bell/phone within reach Nurse Communication: Mobility status PT Visit Diagnosis: Unsteadiness on feet (R26.81);Muscle weakness (generalized) (M62.81);Difficulty in walking, not elsewhere classified (R26.2)     Time: 1129-1150 PT Time Calculation (min) (ACUTE ONLY): 21 min  Charges:  $Therapeutic Activity: 8-22 mins                    Mabeline Caras, PT, DPT Acute Rehabilitation Services  Pager  [phone] Office [place] 11/12/2018, 1:13 PM

## 2018-11-12 NOTE — Consult Note (Signed)
[facility] CONSULT Reason for consult: Cirrhosis of the liver Referring Physician: Dr Aundra Dubin.  PCP: Dr. Nancy Fetter, primary GI: Dr. Paulita Fujita. Keith Nguyen. is an 68 y.o. male.  HPI: He has a long history of abuse of alcohol and drugs.  He developed hepatitis C and was treated with Harvoni in 2016 the [facility] liver clinic.  He had genotype 1b and follow-up labs showed resolution.  He had ultrasound for elastography during his work-up showing fibrosis score F3-4.  Follow-up labs have shown no detectable hepatitis C.  Patient had EGD showing minimal varices and follow-up ultrasounds negative for hepatoma showing probable early cirrhosis.  According to the patient he had a hepatitis B vaccine, unable to find that but he does have slight elevation of his hepatitis B surface antibody.  Here in the hospital there is no detectable hepatitis C virus.  Radiology here has included CT scan of the liver suggesting early cirrhosis with question of lesion in the head of the pancreas and large gallbladder calculi.  MRI showed a pancreatic lesion and did show gallstones. Doppler US showed hepatopedal flow no gross varices. Patient is being evaluated for LVAD.  Evaluation of his most recent parameters reveal him to be a child A cirrhotic. Patient states that he stopped alcohol several months ago complete Past Medical History:  Diagnosis Date  . Abnormal cardiac function test 09/16/2015  . Aortic atherosclerosis ([place]) 09/16/2015  . History of hepatitis C 09/16/2015   Prior treatment with Harvoni   . Hyperlipidemia 09/16/2015  . Hypertensive heart disease without CHF 09/16/2015    Past Surgical History:  Procedure Laterality Date  . CARDIAC CATHETERIZATION N/A 09/22/2015   Procedure: Left Heart Cath and Coronary Angiography;  Surgeon: Troy Sine, MD;  Location: [facility];  Service: Cardiovascular;  Laterality: N/A;  . RIGHT HEART CATH N/A 11/10/2018   Procedure: RIGHT HEART CATH;  Surgeon: Larey Dresser, MD;  Location: [facility];  Service: Cardiovascular;  Laterality: N/A;  . RIGHT/LEFT HEART CATH AND CORONARY ANGIOGRAPHY N/A 11/16/2018   Procedure: RIGHT/LEFT HEART CATH AND CORONARY ANGIOGRAPHY;  Surgeon: Larey Dresser, MD;  Location: [facility];  Service: Cardiovascular;  Laterality: N/A;  . TONSILLECTOMY      Family History  Problem Relation Age of Onset  . Stroke Mother   . Lung cancer Father   . Hypertension Sister     Social History:  reports that he has been smoking. He has never used smokeless tobacco. He reports current alcohol use of about 4.0 - 6.0 standard drinks of alcohol per week. He reports that he does not use drugs.  Allergies:  Allergies  Allergen Reactions  . Lipitor [Atorvastatin]     Elevated liver enzymes    Medications; Prior to Admission medications   Medication Sig Start Date End Date Taking? Authorizing Provider  albuterol (PROVENTIL HFA;VENTOLIN HFA) 108 (90 Base) MCG/ACT inhaler Inhale 1-2 puffs into the lungs every 6 (six) hours as needed for wheezing or shortness of breath. 03/24/18  Yes Wieters, Hallie C, PA-C  allopurinol (ZYLOPRIM) 100 MG tablet Take 100 mg by mouth daily.   Yes [provider]  aspirin 81 MG tablet Take 81 mg by mouth daily.   Yes [provider]  furosemide (LASIX) 40 MG tablet Take 40 mg by mouth daily.   Yes [provider]  hydrALAZINE (APRESOLINE) 25 MG tablet Take 25 mg by mouth 2 (two) times daily.   Yes [provider]  losartan (COZAAR) 100 MG tablet Take 100 mg by mouth daily.   Yes [provider]  metoprolol succinate (TOPROL-XL) 100 MG 24 hr tablet Take 200 mg by mouth daily. Take with or immediately following a meal.   Yes [provider]   . allopurinol  100 mg Oral Daily  . aspirin EC  81 mg Oral Daily  . Chlorhexidine Gluconate Cloth  6 each Topical Daily  . digoxin  0.125 mg Oral Daily  . feeding supplement (ENSURE ENLIVE)  237 mL Oral  TID WC  . heparin  5,000 Units Subcutaneous Q8H  . isosorbide-hydrALAZINE  1 tablet Oral TID  . loratadine  10 mg Oral Daily  . mouth rinse  15 mL Mouth Rinse BID  . metoprolol succinate  25 mg Oral Daily  . sodium chloride flush  10-40 mL Intracatheter Q12H  . sodium chloride flush  3 mL Intravenous Q12H  . sodium chloride flush  3 mL Intravenous Q12H  . sodium chloride flush  3 mL Intravenous Q12H  . spironolactone  25 mg Oral Daily   PRN Meds sodium chloride, acetaminophen, albuterol, methocarbamol, nitroGLYCERIN, ondansetron (ZOFRAN) IV, ondansetron **OR** [DISCONTINUED] ondansetron (ZOFRAN) IV, polyethylene glycol, sodium chloride, sodium chloride flush, sodium chloride flush Results for orders placed or performed during the hospital encounter of 11/14/2018 (from the past 48 hour(s))  Surgical pcr screen     Status: None   Collection Time: 11/10/18  9:55 AM   Specimen: Nasal Mucosa; Nasal Swab  Result Value Ref Range   MRSA, PCR NEGATIVE NEGATIVE   Staphylococcus aureus NEGATIVE NEGATIVE    Comment: (NOTE) The Xpert SA Assay (FDA approved for NASAL specimens in patients 50 years of age and older), is one component of a comprehensive surveillance program. It is not intended to diagnose infection nor to guide or monitor treatment. Performed at [facility], [place] [address], [place], [place] [address]   CEA     Status: None   Collection Time: 11/10/18 10:30 AM  Result Value Ref Range   CEA 4.3 0.0 - 4.7 ng/mL    Comment: (NOTE)                             Nonsmokers          <3.9                             Smokers             <5.6 [employer] Electrochemiluminescence Immunoassay (ECLIA) Values obtained with different assay methods or kits cannot be used interchangeably.  Results cannot be interpreted as absolute evidence of the presence or absence of malignant disease. Performed At: [facility] [place], [place] [id number] Rush Farmer  MD PY:[id number]   Blood gas, arterial     Status: Abnormal   Collection Time: 10/23/2018  1:28 AM  Result Value Ref Range   FIO2 21.00    Delivery systems ROOM AIR    pH, Arterial 7.482 (H) 7.350 - 7.450   pCO2 arterial 37.9 32.0 - 48.0 mmHg   pO2, Arterial 85.6 83.0 - 108.0 mmHg   Bicarbonate 28.0 20.0 - 28.0 mmol/L   Acid-Base Excess 4.5 (H) 0.0 - 2.0 mmol/L   O2 Saturation 96.3 %   Patient temperature 98.6    Collection site LEFT RADIAL    Drawn by [id number]  Sample type ARTERIAL DRAW    Allens test (pass/fail) PASS PASS  .Cooxemetry Panel (carboxy, met, total hgb, O2 sat)     Status: Abnormal   Collection Time: 11/02/2018  4:15 AM  Result Value Ref Range   Total hemoglobin 16.8 (H) 12.0 - 16.0 g/dL   O2 Saturation 61.2 %   Carboxyhemoglobin 1.2 0.5 - 1.5 %   Methemoglobin 0.5 0.0 - 1.5 %  CBC with Differential/Platelet     Status: None   Collection Time: 11/16/2018  5:05 AM  Result Value Ref Range   WBC 5.5 4.0 - 10.5 K/uL   RBC 5.59 4.22 - 5.81 MIL/uL   Hemoglobin 16.9 13.0 - 17.0 g/dL   HCT 47.8 39.0 - 52.0 %   MCV 85.5 80.0 - 100.0 fL   MCH 30.2 26.0 - 34.0 pg   MCHC 35.4 30.0 - 36.0 g/dL   RDW 15.2 11.5 - 15.5 %   Platelets 186 150 - 400 K/uL   nRBC 0.0 0.0 - 0.2 %   Neutrophils Relative % 50 %   Neutro Abs 2.7 1.7 - 7.7 K/uL   Lymphocytes Relative 32 %   Lymphs Abs 1.8 0.7 - 4.0 K/uL   Monocytes Relative 17 %   Monocytes Absolute 0.9 0.1 - 1.0 K/uL   Eosinophils Relative 1 %   Eosinophils Absolute 0.1 0.0 - 0.5 K/uL   Basophils Relative 0 %   Basophils Absolute 0.0 0.0 - 0.1 K/uL   Immature Granulocytes 0 %   Abs Immature Granulocytes 0.01 0.00 - 0.07 K/uL    Comment: Performed at [facility], [address] [place], [place] [address]  Comprehensive metabolic panel     Status: Abnormal   Collection Time: 10/29/2018  5:05 AM  Result Value Ref Range   Sodium 127 (L) 135 - 145 mmol/L   Potassium 4.9 3.5 - 5.1 mmol/L   Chloride 91 (L) 98 - 111 mmol/L    CO2 26 22 - 32 mmol/L   Glucose, Bld 99 70 - 99 mg/dL   BUN 34 (H) 8 - 23 mg/dL   Creatinine, Ser 1.38 (H) 0.61 - 1.24 mg/dL   Calcium 9.8 8.9 - 10.3 mg/dL   Total Protein 7.5 6.5 - 8.1 g/dL   Albumin 3.1 (L) 3.5 - 5.0 g/dL   AST 55 (H) 15 - 41 U/L   ALT 32 0 - 44 U/L   Alkaline Phosphatase 64 38 - 126 U/L   Total Bilirubin 2.0 (H) 0.3 - 1.2 mg/dL   GFR calc non Af Amer 52 (L) >60 mL/min   GFR calc Af Amer >60 >60 mL/min   Anion gap 10 5 - 15    Comment: Performed at [facility], [place] [address] [place], [place] [address]  Digoxin level     Status: Abnormal   Collection Time: 11/15/2018  5:05 AM  Result Value Ref Range   Digoxin Level 0.4 (L) 0.8 - 2.0 ng/mL    Comment: Performed at [facility] [address], [place],  [address]  POCT I-Stat EG7     Status: Abnormal   Collection Time: 11/22/2018  8:18 AM  Result Value Ref Range   pH, Ven 7.395 7.250 - 7.430   pCO2, Ven 48.3 44.0 - 60.0 mmHg   pO2, Ven 39.0 32.0 - 45.0 mmHg   Bicarbonate 29.6 (H) 20.0 - 28.0 mmol/L   TCO2 31 22 - 32 mmol/L   O2 Saturation 73.0 %  Acid-Base Excess 4.0 (H) 0.0 - 2.0 mmol/L   Sodium 131 (L) 135 - 145 mmol/L   Potassium 4.3 3.5 - 5.1 mmol/L   Calcium, Ion 1.26 1.15 - 1.40 mmol/L   HCT 52.0 39.0 - 52.0 %   Hemoglobin 17.7 (H) 13.0 - 17.0 g/dL   Patient temperature HIDE    Sample type VENOUS   POCT I-Stat EG7     Status: Abnormal   Collection Time: 11/19/2018  8:18 AM  Result Value Ref Range   pH, Ven 7.406 7.250 - 7.430   pCO2, Ven 46.9 44.0 - 60.0 mmHg   pO2, Ven 39.0 32.0 - 45.0 mmHg   Bicarbonate 29.5 (H) 20.0 - 28.0 mmol/L   TCO2 31 22 - 32 mmol/L   O2 Saturation 74.0 %   Acid-Base Excess 4.0 (H) 0.0 - 2.0 mmol/L   Sodium 130 (L) 135 - 145 mmol/L   Potassium 4.3 3.5 - 5.1 mmol/L   Calcium, Ion 1.21 1.15 - 1.40 mmol/L   HCT 52.0 39.0 - 52.0 %   Hemoglobin 17.7 (H) 13.0 - 17.0 g/dL   Patient temperature HIDE    Sample type VENOUS   CBC with Differential/Platelet      Status: None   Collection Time: 11/12/18  4:58 AM  Result Value Ref Range   WBC 4.8 4.0 - 10.5 K/uL   RBC 5.19 4.22 - 5.81 MIL/uL   Hemoglobin 15.7 13.0 - 17.0 g/dL   HCT 45.2 39.0 - 52.0 %   MCV 87.1 80.0 - 100.0 fL   MCH 30.3 26.0 - 34.0 pg   MCHC 34.7 30.0 - 36.0 g/dL   RDW 15.3 11.5 - 15.5 %   Platelets 161 150 - 400 K/uL   nRBC 0.0 0.0 - 0.2 %   Neutrophils Relative % 55 %   Neutro Abs 2.6 1.7 - 7.7 K/uL   Lymphocytes Relative 28 %   Lymphs Abs 1.4 0.7 - 4.0 K/uL   Monocytes Relative 14 %   Monocytes Absolute 0.7 0.1 - 1.0 K/uL   Eosinophils Relative 2 %   Eosinophils Absolute 0.1 0.0 - 0.5 K/uL   Basophils Relative 1 %   Basophils Absolute 0.0 0.0 - 0.1 K/uL   Immature Granulocytes 0 %   Abs Immature Granulocytes 0.01 0.00 - 0.07 K/uL    Comment: Performed at [facility], [address], [place], [place] [address]  Comprehensive metabolic panel     Status: Abnormal   Collection Time: 11/12/18  4:58 AM  Result Value Ref Range   Sodium 131 (L) 135 - 145 mmol/L   Potassium 4.6 3.5 - 5.1 mmol/L   Chloride 96 (L) 98 - 111 mmol/L   CO2 26 22 - 32 mmol/L   Glucose, Bld 100 (H) 70 - 99 mg/dL   BUN 30 (H) 8 - 23 mg/dL   Creatinine, Ser 1.13 0.61 - 1.24 mg/dL   Calcium 9.3 8.9 - 10.3 mg/dL   Total Protein 7.6 6.5 - 8.1 g/dL   Albumin 2.9 (L) 3.5 - 5.0 g/dL   AST 46 (H) 15 - 41 U/L   ALT 29 0 - 44 U/L   Alkaline Phosphatase 70 38 - 126 U/L   Total Bilirubin 1.8 (H) 0.3 - 1.2 mg/dL   GFR calc non Af Amer >60 >60 mL/min   GFR calc Af Amer >60 >60 mL/min   Anion gap 9 5 - 15    Comment: Performed at [facility], [place]  [address] [place], [place] [address]  .Cooxemetry Panel (carboxy, met, total hgb, O2 sat)     Status: None   Collection Time: 11/12/18  5:03 AM  Result Value Ref Range   Total hemoglobin 15.8 12.0 - 16.0 g/dL   O2 Saturation 73.3 %   Carboxyhemoglobin 1.1 0.5 - 1.5 %   Methemoglobin 0.5 0.0 - 1.5 %    No results  found.            Blood pressure 122/80, pulse (!) 101, temperature 98.7 F (37.1 C), temperature source Oral, resp. rate 16, height '5\' 11"'  (1.803 m), weight 66 kg, SpO2 97 %.  Physical exam:   General--alert African-American male, no asterixis ENT--nonicteric Neck--supple no lymphadenopathy Heart--regular rate and rhythm Lungs--clear, no shortness of breath Abdomen--soft and nontender, no splenomegaly, unable to palpate liver Psych--alert and oriented answers questions appropriate   Assessment: 1.  Cirrhosis of the liver.  This was probably due to a combination of alcohol and hepatitis C.  The hepatitis C has been adequately treated and there is no detectable virus.  He has stopped all alcohol consumption several months ago and we did reinforce this to him.  Most current parameters continue him to be Childs A cirrhotic.  I do not feel there is anything that could be done to include his operative risk.  His liver function appears to be doing fairly well. 2.  CHF.  Patient being evaluated for LVAD  Plan: As a Childs A cirrhotic, his risk should be acceptable.  His hepatic reserve could be suboptimal but hopefully doing okay. Please let us know if we can be of any further help.   Nancy Fetter 11/12/2018, 9:53 AM   This note was created using voice recognition software and minor errors may Have occurred unintentionally. Pager: [phone] If no answer or after hours call [phone]

## 2018-11-12 NOTE — Progress Notes (Signed)
Nutrition Follow-up  RD working remotely.  DOCUMENTATION CODES:   Severe malnutrition in context of chronic illness  INTERVENTION:   - Ensure Enlive po TID, each supplement provides 350 kcal and 20 grams of protein  - Magic cup TID with meals, each supplement provides 290 kcal and 9 grams of protein  NUTRITION DIAGNOSIS:   Severe Malnutrition related to chronic illness (CHF, COPD) as evidenced by severe fat depletion, severe muscle depletion, percent weight loss (19.5% weight loss in less than 8 months).  Ongoing, being addressed via oral nutrition supplements  GOAL:   Patient will meet greater than or equal to 90% of their needs  Progressing  MONITOR:   PO intake, Supplement acceptance, Labs, Weight trends, I & O's  REASON FOR ASSESSMENT:   Consult LVAD Eval  ASSESSMENT:   68 year old male who presented to the ED on 10/12 with SOB. PMH of CHF, HLD, CAD, COPD, HTN, CKD stage III, hepatitis C, tobacco use, cirrhosis, polysubstance abuse.  10/15 - s/p right heart cath 10/20 - s/p right/left heart cath  Noted plan for LVAD placement either Friday or Monday. GI saw pt today and cleared him for surgery.  Weight down 19 lbs since admit. Suspect weight loss is related to negative fluid balance.  Unable to reach pt via phone call to room. PO intake at meals has improved and pt is accepting Ensure Enlive supplements per [facility]. Will continue with current supplement regimen.  Meal Completion: 100% x last 6 recorded meals  Medications reviewed and include: Ensure Enlive TID, spironolactone, milrinone  Labs reviewed: sodium 131, chloride 96, BUN 30  UOP: 1725 ml x 24 hours I/O's: -15.3 L since admit  Diet Order:   Diet Order            Diet 2 gram sodium Room service appropriate? Yes; Fluid consistency: Thin  Diet effective now              EDUCATION NEEDS:   Education needs have been addressed  Skin:  Skin Assessment: Reviewed RN Assessment  Last BM:   11/09/18  Height:   Ht Readings from Last 1 Encounters:  11/04/18 5\' 11"  (1.803 m)    Weight:   Wt Readings from Last 1 Encounters:  11/12/18 66 kg    Ideal Body Weight:  78.2 kg  BMI:  Body mass index is 20.29 kg/m.  Estimated Nutritional Needs:   Kcal:  2000-2200  Protein:  90-110 grams  Fluid:  >/= 1.8 L    G[place] Face, MS, RD, LDN Inpatient Clinical Dietitian Pager: [phone] Weekend/After Hours: [phone]

## 2018-11-12 NOTE — Progress Notes (Signed)
Patient ID: Delia Bedillion., male   DOB: 1950/05/09, 68 y.o.   MRN: [id number]     Advanced Heart Failure Rounding Note  PCP-Cardiologist: No primary care provider on file.   Subjective:    He remains on milrinone 0.375 mcg. No problems yesterday, walked with PT. Creatinine lower at 1.13 today.  CVP 3-4, no complaints.   CT chest with moderate emphysema, 4.5 cm ascending aorta.   CT abdomen with cirrhosis, ?gallstone, subtle 10 x 10 mm lesion head/neck pancreas.   MRI abdomen pancreas protocol: No pancreatic lesion, gallstones in gallbladder without cholecystitis.   Limited echo with Definity was reviewed, I do not see LV thrombus.   PFTs with minimal obstruction.   RHC Procedural Findings (milrinone 0.375): Hemodynamics (mmHg) RA mean 14 RV 46/13 PA 49/20, 31 PCWP mean 16 Oxygen saturations: PA 70% AO 93% Cardiac Output (Fick) 5.88  Cardiac Index (Fick) 3.06 PVR 2.55 PAPI 2.07 CVP/PCWP 0.875 Cardiac Output (Thermo) 3.16 Cardiac Index (Thermo) 1.64  RHC/LHC (10/23/2018):  Coronary Findings  Diagnostic Dominance: Left Left Main  No significant disease.  Left Anterior Descending  Luminal irregularities.  Left Circumflex  Large vessel supplies left PDA. Luminal irregularities.  Right Coronary Artery  Small, nondominant vessel. 30% stenosis proximally.  Intervention  No interventions have been documented. Right Heart  Right Heart Pressures RHC Procedural Findings (milrinone 0.375): Hemodynamics (mmHg) RA mean 1 RV 25/1 PA 25/2 mean 15 PCWP mean 8 LV 108/1 AO 108/73  Oxygen saturations: PA 74% AO 96%  Cardiac Output (Fick) 4.81  Cardiac Index (Fick) 2.63      Objective:   Weight Range: 66 kg Body mass index is 20.29 kg/m.   Vital Signs:   Temp:  [97.5 F (36.4 C)-99 F (37.2 C)] 98.7 F (37.1 C) (10/21 0749) Pulse Rate:  [69-101] 101 (10/21 0749) Resp:  [9-23] 16 (10/21 0749) BP: (107-163)/(78-90) 122/80 (10/21 0749) SpO2:  [91 %-100 %]  97 % (10/21 0749) Weight:  [66 kg-66.5 kg] 66 kg (10/21 0509) Last BM Date: 11/09/18  Weight change: Filed Weights   10/31/2018 0455 11/12/18 0503 11/12/18 0509  Weight: 64.9 kg 66.5 kg 66 kg    Intake/Output:   Intake/Output Summary (Last 24 hours) at 11/12/2018 0848 Last data filed at 11/12/2018 0506 Gross per 24 hour  Intake 1139.48 ml  Output 1725 ml  Net -585.52 ml      Physical Exam   General: NAD Neck: No JVD, no thyromegaly or thyroid nodule.  Lungs: Clear to auscultation bilaterally with normal respiratory effort. CV: Lateral PMI.  Heart regular S1/S2, no S3/S4, no murmur.  No peripheral edema.   Abdomen: Soft, nontender, no hepatosplenomegaly, no distention.  Skin: Intact without lesions or rashes.  Neurologic: Alert and oriented x 3.  Psych: Normal affect. Extremities: No clubbing or cyanosis.  HEENT: Normal.   Telemetry   NSR 90s-100s. Personally reviewed   Labs    CBC Recent Labs    10/31/2018 0505 11/22/2018 0818 11/12/18 0458  WBC 5.5  --  4.8  NEUTROABS 2.7  --  2.6  HGB 16.9 17.7*  17.7* 15.7  HCT 47.8 52.0  52.0 45.2  MCV 85.5  --  87.1  PLT 186  --  [id number]   Basic Metabolic Panel Recent Labs    11/10/2018 0505 10/28/2018 0818 11/12/18 0458  NA 127* 130*  131* 131*  K 4.9 4.3  4.3 4.6  CL 91*  --  96*  CO2 26  --  26  GLUCOSE  99  --  100*  BUN 34*  --  30*  CREATININE 1.38*  --  1.13  CALCIUM 9.8  --  9.3   Liver Function Tests Recent Labs    10/28/2018 0505 11/12/18 0458  AST 55* 46*  ALT 32 29  ALKPHOS 64 70  BILITOT 2.0* 1.8*  PROT 7.5 7.6  ALBUMIN 3.1* 2.9*   No results for input(s): LIPASE, AMYLASE in the last 72 hours. Cardiac Enzymes No results for input(s): CKTOTAL, CKMB, CKMBINDEX, TROPONINI in the last 72 hours.  BNP: BNP (last 3 results) Recent Labs    11/19/2018 2019  BNP >4,500.0*    ProBNP (last 3 results) No results for input(s): PROBNP in the last 8760 hours.   D-Dimer No results for input(s):  DDIMER in the last 72 hours. Hemoglobin A1C No results for input(s): HGBA1C in the last 72 hours. Fasting Lipid Panel No results for input(s): CHOL, HDL, LDLCALC, TRIG, CHOLHDL, LDLDIRECT in the last 72 hours. Thyroid Function Tests No results for input(s): TSH, T4TOTAL, T3FREE, THYROIDAB in the last 72 hours.  Invalid input(s): FREET3  Other results:   Imaging    No results found.   Medications:     Scheduled Medications: . allopurinol  100 mg Oral Daily  . aspirin EC  81 mg Oral Daily  . Chlorhexidine Gluconate Cloth  6 each Topical Daily  . digoxin  0.125 mg Oral Daily  . feeding supplement (ENSURE ENLIVE)  237 mL Oral TID WC  . heparin  5,000 Units Subcutaneous Q8H  . isosorbide-hydrALAZINE  1 tablet Oral TID  . loratadine  10 mg Oral Daily  . mouth rinse  15 mL Mouth Rinse BID  . metoprolol succinate  25 mg Oral Daily  . sodium chloride flush  10-40 mL Intracatheter Q12H  . sodium chloride flush  3 mL Intravenous Q12H  . sodium chloride flush  3 mL Intravenous Q12H  . sodium chloride flush  3 mL Intravenous Q12H  . spironolactone  25 mg Oral Daily    Infusions: . sodium chloride    . milrinone 0.375 mcg/kg/min (11/12/18 0743)    PRN Medications: sodium chloride, acetaminophen, albuterol, methocarbamol, nitroGLYCERIN, ondansetron (ZOFRAN) IV, ondansetron **OR** [DISCONTINUED] ondansetron (ZOFRAN) IV, polyethylene glycol, sodium chloride, sodium chloride flush, sodium chloride flush   Assessment/Plan   1. Acute on chronic systolic CHF: Nonischemic cardiomyopathy diagnosed in 2017 with echo showing EF 40% and LHC showing mild nonobstructive CAD.  He saw Dr. Wynonia Lawman in the past, but no cardiology evaluation since 2017.  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  He has drunk moderate ETOH in the past (no longer  drinks) but does not seem to have drunk enough to cause a cardiomyopathy.  He was markedly volume overloaded on exam initially with biventricular failure at admission with rise in creatinine concerning for cardiorenal syndrome. Initial co-ox 40% suggested low CO, milrinone started and increased to 0.375.  He was begun on Lasix gtt at 12 mg/hr and later stopped.  RHC showed R>L heart failure.  Cardiac output looked good on milrinone by Fick but was low by thermodilution (discordant values).  Repeat LHC/RHC yesterday, very low filling pressures and good cardiac index by Fick.  No significant coronary disease noted.  Today, CVP 3-4 with co-ox 73%.  - No Lasix today.  - Continue milrinone 0.375 mcg/kg/min.     -  Continue digoxin, level ok recently.   - Continue Bidil 1 tab tid.  - Continue spironolactone 25 mg daily.   - Continue Toprol XL to 25 mg daily with volume overload and low output (also with low HR at times).  - We are working him up for LVAD.  CVP remains low, suspect his RV would support LVAD.  However, he also has cirrhosis which will be a consideration.  INR was 1.2 and albumen 3.2 so probably not severe liver dysfunction => GI saw today, Ardine Eng A and think he could handle surgery.  PFTs with minimal obstruction despite smoking history.  He is from [facility] but would stay with his daughter in [place]. There was question of pancreatic lesion but pancreatic protocol MRI showed no lesion.  Anticipate LVAD placement later this admission, Friday versus Monday.   2. AKI on CKD stage 3: Suspect this may be a combination of cardiorenal syndrome and contrast nephropathy (had CTA chest at admission).  Minimal contrast with coronary angiography, creatinine stable at 1.13 today.  3. 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  He was drinking moderate ETOH as well but has quit completely for about 1 month. INR 1.2, albumen 3.2. NH3 was normal. Ardine Eng A per GI and  should be stable for surgery.  4. COPD/smoking: He recently quit smoking.  CT chest with moderate emphysema. He is now off oxygen. Minimal obstruction on PFTs.  5. Confusion:  Completely resolved.  Had overnight 10/14-15.  NH3 was normal, doubt hepatic encephalopathy.  No recent ETOH, not due to ETOH withdrawal.  Think may have been due to low output and hospitalization.  CT head unremarkable.  6. Hyponatremia: Need to fluid restrict, 1500 cc.  7. Aortic insufficiency: ?Moderate range.   Will decide on surgical timing with TCTS.    Length of Stay: 8  Loralie Champagne, MD  11/12/2018, 8:48 AM  Advanced Heart Failure Team Pager [phone] (M-F; 7a - 4p)  Please contact [facility] for night-coverage after hours (4p -7a ) and weekends on [email or web address]

## 2018-11-12 NOTE — TOC Progression Note (Signed)
Transition of Care (TOC) - Progression Note    Patient Details  Name: Keith Nguyen. MRN: [id number] Date of Birth: [birth year]  Transition of Care [facility]) CM/SW Contact  Zenon Mayo, RN Phone Number: 11/12/2018, 8:52 PM  Clinical Narrative:    Patient is a transfer from [facility], from home with wife, he is for LVAD workup on milrinone drip.    Expected Discharge Plan: [place]    Expected Discharge Plan and Services Expected Discharge Plan: [place]   Discharge Planning Services: CM Consult   Living arrangements for the past 2 months: Single Family Home                                       Social Determinants of Health (SDOH) Interventions    Readmission Risk Interventions No flowsheet data found.

## 2018-11-12 NOTE — Progress Notes (Signed)
PROGRESS NOTE    Keith Nguyen.  [id number] DOB: 1950/11/02 DOA: 11/01/2018 PCP: [name], MD   Brief Narrative: 68 year old with past medical history significant for systolic heart failure ejection fraction 35 to 40%, hypertension, CKD stage III, hepatitis C cirrhosis treated with Harvoni COPD tobacco alcohol use quit last month, visiting daughter in [place] presented to the ED on 10/12 with progressive dyspnea associated with nonproductive cough and fluid retention for 3 days.  Evaluation in the ED patient was noted to be in heart failure, CT angio was done and it was negative for PE did reveal moderate emphysema, 4.5 cm ascending aorta,  anasarca and a small left pleural effusion.  Patient was evaluated by cardiology for acute systolic heart failure exacerbation, managed with Lasix drip as well as milrinone drip.  Echo this admission showed lower ejection fraction 20 to 25%.  Initially there was rise in creatinine concerning for cardiorenal syndrome which improved with milrinone drip.  Patient underwent left heart cath/pulmonary function test for evaluation of LVAD.  Plan for LVAD probably the end of the week.  GI has been consulted for evaluation of cirrhosis in anticipation of LVAD.    Assessment & Plan:   Principal Problem:   Acute on chronic congestive heart failure (HCC) Active Problems:   Hyperlipidemia   History of hepatitis C   Essential hypertension   Acute on chronic systolic (congestive) heart failure (HCC)   Protein-calorie malnutrition, severe   DNR no code (do not resuscitate)   Other fatigue  1-Acute on chronic systolic heart failure with acute hypoxic respiratory failure; Heart failure team following. Continue with milrinone drip. Continue with digoxin, spironolactone metoprolol BiDil. Lasix on hold. Status post right heart cath and left heart cath this admission. Plan for probably LVAD the end of the week.  Awaiting GI evaluation.  2-Cardiorenal  syndrome with AKI on chronic kidney disease stage III: Creatinine improved and is stable. Lasix and losartan on hold.  3-Hepatitis C liver cirrhosis: Hepatitis C RNA quantitative confirm hepatitis C clearance. Patient to be evaluated by GI today.  On Aldactone. Per GI patient is child A cirrhotic, his risk should be  acceptable.  4-Hypertension: On BiDil, spironolactone.  5-Hyponatremia, improving. 6-Delirium: Resolved thought to be related to medication 7-sleep apnea: Pulse oxygen therapy while sleeping.  Will need home oxygen at discharge 8-severe protein caloric malnutrition: Low albumin.  Seen by nutritionist        Nutrition Problem: Severe Malnutrition Etiology: chronic illness(CHF, COPD)    Signs/Symptoms: severe fat depletion, severe muscle depletion, percent weight loss(19.5% weight loss in less than 8 months) Percent weight loss: 19.5 %(less than 8 months)    Interventions: Ensure Enlive (each supplement provides 350kcal and 20 grams of protein), Magic cup, Liberalize Diet  Estimated body mass index is 20.29 kg/m as calculated from the following:   Height as of this encounter: 5\' 11"  (1.803 m).   Weight as of this encounter: 66 kg.   DVT prophylaxis: Heparin Code Status: Full code Family Communication: Care discussed with patient Disposition Plan: Remain in the hospital on IV Miradon, he will require LVAD Consultants:   Cardiology  Procedures:  Left and right cath Echo Antimicrobials:    Subjective: Alert and oriented he was going for a walk with cardiac rehab.  He denies shortness of breath.  Denies chest pain  Objective: Vitals:   11/12/18 0043 11/12/18 0503 11/12/18 0509 11/12/18 0749  BP: 107/82 116/78  122/80  Pulse: (!) 101 100  (!) 101  Resp: 18   16  Temp: 98.9 F (37.2 C) 99 F (37.2 C)  98.7 F (37.1 C)  TempSrc: Oral Oral  Oral  SpO2: 96% 100%  97%  Weight:  66.5 kg 66 kg   Height:        Intake/Output Summary (Last 24  hours) at 11/12/2018 0908 Last data filed at 11/12/2018 0800 Gross per 24 hour  Intake 1139.48 ml  Output 2125 ml  Net -985.52 ml   Filed Weights   10/31/2018 0455 11/12/18 0503 11/12/18 0509  Weight: 64.9 kg 66.5 kg 66 kg    Examination:  General exam: Appears calm and comfortable  Respiratory system: Clear to auscultation. Respiratory effort normal. Cardiovascular system: S1 & S2 heard, RRR. No JVD, murmurs, rubs, gallops or clicks. No pedal edema. Gastrointestinal system: Abdomen is nondistended, soft and nontender. No organomegaly or masses felt. Normal bowel sounds heard. Central nervous system: Alert and oriented. No focal neurological deficits. Extremities: Symmetric 5 x 5 power. Skin: No rashes, lesions or ulcers    Data Reviewed: I have personally reviewed following labs and imaging studies  CBC: Recent Labs  Lab 11/08/18 0415 11/09/18 0410 11/10/18 0531 10/27/2018 0505 10/25/2018 0818 11/12/18 0458  WBC 5.4 5.3 5.0 5.5  --  4.8  NEUTROABS 2.8 2.7 2.5 2.7  --  2.6  HGB 16.3 17.2* 16.4 16.9 17.7*  17.7* 15.7  HCT 48.3 49.0 48.5 47.8 52.0  52.0 45.2  MCV 86.7 86.7 87.4 85.5  --  87.1  PLT 216 222 201 186  --  [id number]   Basic Metabolic Panel: Recent Labs  Lab 11/07/2018 1807  11/08/18 0415 11/08/18 0757 11/09/18 0410 11/10/18 0531 11/05/2018 0505 10/26/2018 0818 11/12/18 0458  NA  --    < > 124*  --  129* 129* 127* 130*  131* 131*  K  --    < > 3.7  --  4.3 4.6 4.9 4.3  4.3 4.6  CL  --    < > 79*  --  86* 89* 91*  --  96*  CO2  --    < > 32  --  31 29 26   --  26  GLUCOSE  --    < > 124*  --  104* 125* 99  --  100*  BUN  --    < > 32*  --  29* 33* 34*  --  30*  CREATININE  --    < > 1.42*  --  1.33* 1.37* 1.38*  --  1.13  CALCIUM  --    < > 9.3  --  9.4 9.7 9.8  --  9.3  MG 1.8  --  19.7* 2.1  --   --   --   --   --    < > = values in this interval not displayed.   GFR: Estimated Creatinine Clearance: 57.6 mL/min (by C-G formula based on SCr of 1.13 mg/dL).  Liver Function Tests: Recent Labs  Lab 11/08/18 0415 11/09/18 0410 11/10/18 0531 11/05/2018 0505 11/12/18 0458  AST 104* 93* 64* 55* 46*  ALT 57* 50* 41 32 29  ALKPHOS 71 74 74 64 70  BILITOT 1.4* 1.3* 1.4* 2.0* 1.8*  PROT 7.4 8.2* 8.3* 7.5 7.6  ALBUMIN 3.0* 3.2* 3.3* 3.1* 2.9*   Recent Labs  Lab 11/07/18 1645  LIPASE 35  AMYLASE 176*   Recent Labs  Lab 11/11/2018 0829  AMMONIA 30   Coagulation Profile: Recent Labs  Lab 10/30/2018 1056  11/08/18 0415  INR 1.7* 1.2   Cardiac Enzymes: No results for input(s): CKTOTAL, CKMB, CKMBINDEX, TROPONINI in the last 168 hours. BNP (last 3 results) No results for input(s): PROBNP in the last 8760 hours. HbA1C: No results for input(s): HGBA1C in the last 72 hours. CBG: Recent Labs  Lab 11/05/18 2006  GLUCAP 123*   Lipid Profile: No results for input(s): CHOL, HDL, LDLCALC, TRIG, CHOLHDL, LDLDIRECT in the last 72 hours. Thyroid Function Tests: No results for input(s): TSH, T4TOTAL, FREET4, T3FREE, THYROIDAB in the last 72 hours. Anemia Panel: No results for input(s): VITAMINB12, FOLATE, FERRITIN, TIBC, IRON, RETICCTPCT in the last 72 hours. Sepsis Labs: No results for input(s): PROCALCITON, LATICACIDVEN in the last 168 hours.  Recent Results (from the past 240 hour(s))  SARS CORONAVIRUS 2 (TAT 6-24 HRS) Nasopharyngeal Nasopharyngeal Swab     Status: None   Collection Time: 11/04/18 12:29 AM   Specimen: Nasopharyngeal Swab  Result Value Ref Range Status   SARS Coronavirus 2 NEGATIVE NEGATIVE Final    Comment: (NOTE) SARS-CoV-2 target nucleic acids are NOT DETECTED. The SARS-CoV-2 RNA is generally detectable in upper and lower respiratory specimens during the acute phase of infection. Negative results do not preclude SARS-CoV-2 infection, do not rule out co-infections with other pathogens, and should not be used as the sole basis for treatment or other patient management decisions. Negative results must be combined with  clinical observations, patient history, and epidemiological information. The expected result is Negative. Fact Sheet for Patients: SugarRoll.be Fact Sheet for Healthcare Providers: https://www.woods-mathews.com/ This test is not yet approved or cleared by the [place] FDA and  has been authorized for detection and/or diagnosis of SARS-CoV-2 by FDA under an Emergency Use Authorization (EUA). This EUA will remain  in effect (meaning this test can be used) for the duration of the COVID-19 declaration under Section 56 4(b)(1) of the Act, 21 U.S.C. section 360bbb-3(b)(1), unless the authorization is terminated or revoked sooner. Performed at [facility], [place] [address], [place], [place] [address]   MRSA PCR Screening     Status: None   Collection Time: 11/05/18  2:15 PM   Specimen: Nasal Mucosa; Nasopharyngeal  Result Value Ref Range Status   MRSA by PCR NEGATIVE NEGATIVE Final    Comment:        The GeneXpert MRSA Assay (FDA approved for NASAL specimens only), is one component of a comprehensive MRSA colonization surveillance program. It is not intended to diagnose MRSA infection nor to guide or monitor treatment for MRSA infections. Performed at [facility], [place] [address] [place], [place], [place] [address]   Surgical pcr screen     Status: None   Collection Time: 11/10/18  9:55 AM   Specimen: Nasal Mucosa; Nasal Swab  Result Value Ref Range Status   MRSA, PCR NEGATIVE NEGATIVE Final   Staphylococcus aureus NEGATIVE NEGATIVE Final    Comment: (NOTE) The Xpert SA Assay (FDA approved for NASAL specimens in patients 57 years of age and older), is one component of a comprehensive surveillance program. It is not intended to diagnose infection nor to guide or monitor treatment. Performed at [facility], [place] [address], [place], [place] [address]          Radiology Studies: No results found.       Scheduled Meds: . allopurinol  100 mg Oral Daily  . aspirin EC  81 mg Oral Daily  . Chlorhexidine Gluconate Cloth  6 each Topical Daily  .  digoxin  0.125 mg Oral Daily  . feeding supplement (ENSURE ENLIVE)  237 mL Oral TID WC  . heparin  5,000 Units Subcutaneous Q8H  . isosorbide-hydrALAZINE  1 tablet Oral TID  . loratadine  10 mg Oral Daily  . mouth rinse  15 mL Mouth Rinse BID  . metoprolol succinate  25 mg Oral Daily  . sodium chloride flush  10-40 mL Intracatheter Q12H  . sodium chloride flush  3 mL Intravenous Q12H  . sodium chloride flush  3 mL Intravenous Q12H  . sodium chloride flush  3 mL Intravenous Q12H  . spironolactone  25 mg Oral Daily   Continuous Infusions: . sodium chloride    . milrinone 0.375 mcg/kg/min (11/12/18 0743)     LOS: 8 days    Time spent: 35 minutes    Elmarie Shiley, MD [facility] Pager [phone]  If 7PM-7AM, please contact night-coverage [email or web address] Password [email or web address] 11/12/2018, 9:08 AM

## 2018-11-13 DIAGNOSIS — I5023 Acute on chronic systolic (congestive) heart failure: Secondary | ICD-10-CM | POA: Diagnosis not present

## 2018-11-13 DIAGNOSIS — Z515 Encounter for palliative care: Secondary | ICD-10-CM

## 2018-11-13 LAB — CBC WITH DIFFERENTIAL/PLATELET
Abs Immature Granulocytes: 0.02 10*3/uL (ref 0.00–0.07)
Basophils Absolute: 0 10*3/uL (ref 0.0–0.1)
Basophils Relative: 1 %
Eosinophils Absolute: 0.2 10*3/uL (ref 0.0–0.5)
Eosinophils Relative: 3 %
HCT: 44.9 % (ref 39.0–52.0)
Hemoglobin: 15.1 g/dL (ref 13.0–17.0)
Immature Granulocytes: 0 %
Lymphocytes Relative: 27 %
Lymphs Abs: 1.5 10*3/uL (ref 0.7–4.0)
MCH: 29.2 pg (ref 26.0–34.0)
MCHC: 33.6 g/dL (ref 30.0–36.0)
MCV: 86.8 fL (ref 80.0–100.0)
Monocytes Absolute: 0.8 10*3/uL (ref 0.1–1.0)
Monocytes Relative: 14 %
Neutro Abs: 3 10*3/uL (ref 1.7–7.7)
Neutrophils Relative %: 55 %
Platelets: 174 10*3/uL (ref 150–400)
RBC: 5.17 MIL/uL (ref 4.22–5.81)
RDW: 15.2 % (ref 11.5–15.5)
WBC: 5.5 10*3/uL (ref 4.0–10.5)
nRBC: 0 % (ref 0.0–0.2)

## 2018-11-13 LAB — BASIC METABOLIC PANEL
Anion gap: 10 (ref 5–15)
BUN: 28 mg/dL — ABNORMAL HIGH (ref 8–23)
CO2: 26 mmol/L (ref 22–32)
Calcium: 9.2 mg/dL (ref 8.9–10.3)
Chloride: 96 mmol/L — ABNORMAL LOW (ref 98–111)
Creatinine, Ser: 1.12 mg/dL (ref 0.61–1.24)
GFR calc Af Amer: >60 mL/min (ref >60)
GFR calc non Af Amer: >60 mL/min (ref >60)
Glucose, Bld: 106 mg/dL — ABNORMAL HIGH (ref 70–99)
Potassium: 4.7 mmol/L (ref 3.5–5.1)
Sodium: 132 mmol/L — ABNORMAL LOW (ref 135–145)

## 2018-11-13 LAB — COOXEMETRY PANEL
Carboxyhemoglobin: 1 % (ref 0.5–1.5)
Methemoglobin: 0.6 % (ref 0.0–1.5)
O2 Saturation: 63.1 %
Total hemoglobin: 14.5 g/dL (ref 12.0–16.0)

## 2018-11-13 MED ORDER — POLYETHYLENE GLYCOL 3350 17 G PO PACK
17.0000 g | PACK | Freq: Two times a day (BID) | ORAL | Status: DC
  Administered 2018-11-13 – 2018-11-14 (×3): 17 g via ORAL
  Filled 2018-11-13 (×4): qty 1

## 2018-11-13 MED ORDER — SENNOSIDES-DOCUSATE SODIUM 8.6-50 MG PO TABS
1.0000 | ORAL_TABLET | Freq: Two times a day (BID) | ORAL | Status: DC
  Administered 2018-11-13 – 2018-11-16 (×5): 1 via ORAL
  Filled 2018-11-13 (×5): qty 1

## 2018-11-13 NOTE — Progress Notes (Signed)
Patient ID: Keith Nguyen., male   DOB: 1950-10-06, 69 y.o.   MRN: [id number]  This NP visited patient at the bedside as a follow up to initial conversation regarding goals of care and LVAD implantation.  Patient verbalizes optimism and readiness for his anticipated LVAD procedure this coming Monday. I brought to the room a advanced directive packet along with a MOST form.  Patient tells me he is not interested in completing these forms at this time.  I then spoke to the patient's wife by telephone to introduce the role of palliative medicine into a holistic treatment plan.    She does not voice any specific question or concern, she tells me that she will be staying with her daughter and son-in-law moving forward.  And once her husband discharges from the hospital post LVAD they both will be staying here in [place] with her daughter.  She too is optimistic and hopeful for continued quality of life post LVAD  Discussed with patient and his wife  the importance of continued conversation with each other and their family and the  medical providers regarding overall plan of care and treatment options,  ensuring decisions are within the context of the patients values and GOCs.  Questions and concerns addressed  PMT will continue to support holistically  Total time spent on the unit was 35 minutes  Greater than 50% of the time was spent in counseling and coordination of care  Wadie Lessen NP  Palliative Medicine Team Team Phone # [phone] Pager [phone]

## 2018-11-13 NOTE — Progress Notes (Addendum)
Patient ID: Keith Koepke., male   DOB: 1950-12-25, 68 y.o.   MRN: [id number]     Advanced Heart Failure Rounding Note  PCP-Cardiologist: No primary care provider on file.   Subjective:    No complaints this morning. Feels well.   He remains on milrinone 0.375 mcg. Co-ox 63%. Creatinine lower at 1.12 today.  CVP 4-5 (checked at bedside).  CT chest with moderate emphysema, 4.5 cm ascending aorta.   CT abdomen with cirrhosis, ?gallstone, subtle 10 x 10 mm lesion head/neck pancreas.   MRI abdomen pancreas protocol: No pancreatic lesion, gallstones in gallbladder without cholecystitis.   Limited echo with Definity was reviewed, I do not see LV thrombus.   PFTs with minimal obstruction.   RHC Procedural Findings (milrinone 0.375): Hemodynamics (mmHg) RA mean 14 RV 46/13 PA 49/20, 31 PCWP mean 16 Oxygen saturations: PA 70% AO 93% Cardiac Output (Fick) 5.88  Cardiac Index (Fick) 3.06 PVR 2.55 PAPI 2.07 CVP/PCWP 0.875 Cardiac Output (Thermo) 3.16 Cardiac Index (Thermo) 1.64  RHC/LHC (11/09/2018):  Coronary Findings  Diagnostic Dominance: Left Left Main  No significant disease.  Left Anterior Descending  Luminal irregularities.  Left Circumflex  Large vessel supplies left PDA. Luminal irregularities.  Right Coronary Artery  Small, nondominant vessel. 30% stenosis proximally.  Intervention  No interventions have been documented. Right Heart  Right Heart Pressures RHC Procedural Findings (milrinone 0.375): Hemodynamics (mmHg) RA mean 1 RV 25/1 PA 25/2 mean 15 PCWP mean 8 LV 108/1 AO 108/73  Oxygen saturations: PA 74% AO 96%  Cardiac Output (Fick) 4.81  Cardiac Index (Fick) 2.63      Objective:   Weight Range: 66.7 kg Body mass index is 20.51 kg/m.   Vital Signs:   Temp:  [97.8 F (36.6 C)-98.4 F (36.9 C)] 97.8 F (36.6 C) (10/22 0407) Pulse Rate:  [92-104] 101 (10/22 0407) Resp:  [15-26] 26 (10/22 0407) BP: (102-112)/(55-88) 105/79 (10/22  0407) SpO2:  [98 %-100 %] 98 % (10/22 0800) Weight:  [66.7 kg] 66.7 kg (10/22 0407) Last BM Date: 11/09/18  Weight change: Filed Weights   11/12/18 0503 11/12/18 0509 11/13/18 0407  Weight: 66.5 kg 66 kg 66.7 kg    Intake/Output:   Intake/Output Summary (Last 24 hours) at 11/13/2018 0821 Last data filed at 11/13/2018 0800 Gross per 24 hour  Intake 562.76 ml  Output 1600 ml  Net -1037.24 ml      Physical Exam  CVP 4-5 PHYSICAL EXAM: General:  Well appearing. No respiratory difficulty HEENT: normal Neck: supple. no JVD. Carotids 2+ bilat; no bruits. No lymphadenopathy or thyromegaly appreciated. Cor: PMI nondisplaced. Regular rate & rhythm. No rubs, gallops or murmurs. Lungs: clear Abdomen: soft, nontender, nondistended. No hepatosplenomegaly. No bruits or masses. Good bowel sounds. Extremities: no cyanosis, clubbing, rash, edema Neuro: alert & oriented x 3, cranial nerves grossly intact. moves all 4 extremities w/o difficulty. Affect pleasant.   Telemetry   NSR 90s-100s. Personally reviewed   Labs    CBC Recent Labs    11/12/18 0458 11/13/18 0251  WBC 4.8 5.5  NEUTROABS 2.6 3.0  HGB 15.7 15.1  HCT 45.2 44.9  MCV 87.1 86.8  PLT 161 [id number]   Basic Metabolic Panel Recent Labs    11/12/18 0458 11/13/18 0251  NA 131* 132*  K 4.6 4.7  CL 96* 96*  CO2 26 26  GLUCOSE 100* 106*  BUN 30* 28*  CREATININE 1.13 1.12  CALCIUM 9.3 9.2   Liver Function Tests Recent Labs  10/25/2018 0505 11/12/18 0458  AST 55* 46*  ALT 32 29  ALKPHOS 64 70  BILITOT 2.0* 1.8*  PROT 7.5 7.6  ALBUMIN 3.1* 2.9*   No results for input(s): LIPASE, AMYLASE in the last 72 hours. Cardiac Enzymes No results for input(s): CKTOTAL, CKMB, CKMBINDEX, TROPONINI in the last 72 hours.  BNP: BNP (last 3 results) Recent Labs    11/07/2018 2019  BNP >4,500.0*    ProBNP (last 3 results) No results for input(s): PROBNP in the last 8760 hours.   D-Dimer No results for input(s):  DDIMER in the last 72 hours. Hemoglobin A1C No results for input(s): HGBA1C in the last 72 hours. Fasting Lipid Panel No results for input(s): CHOL, HDL, LDLCALC, TRIG, CHOLHDL, LDLDIRECT in the last 72 hours. Thyroid Function Tests No results for input(s): TSH, T4TOTAL, T3FREE, THYROIDAB in the last 72 hours.  Invalid input(s): FREET3  Other results:   Imaging    No results found.   Medications:     Scheduled Medications:  allopurinol  100 mg Oral Daily   aspirin EC  81 mg Oral Daily   Chlorhexidine Gluconate Cloth  6 each Topical Daily   digoxin  0.125 mg Oral Daily   feeding supplement (ENSURE ENLIVE)  237 mL Oral TID WC   heparin  5,000 Units Subcutaneous Q8H   isosorbide-hydrALAZINE  1 tablet Oral TID   loratadine  10 mg Oral Daily   mouth rinse  15 mL Mouth Rinse BID   metoprolol succinate  25 mg Oral Daily   sodium chloride flush  10-40 mL Intracatheter Q12H   sodium chloride flush  3 mL Intravenous Q12H   sodium chloride flush  3 mL Intravenous Q12H   sodium chloride flush  3 mL Intravenous Q12H   spironolactone  25 mg Oral Daily   thiamine  100 mg Intravenous Daily    Infusions:  sodium chloride     milrinone 0.375 mcg/kg/min (11/12/18 1800)    PRN Medications: sodium chloride, acetaminophen, albuterol, methocarbamol, nitroGLYCERIN, ondansetron (ZOFRAN) IV, ondansetron **OR** [DISCONTINUED] ondansetron (ZOFRAN) IV, polyethylene glycol, sodium chloride, sodium chloride flush, sodium chloride flush   Assessment/Plan   1. Acute on chronic systolic CHF: Nonischemic cardiomyopathy diagnosed in 2017 with echo showing EF 40% and LHC showing mild nonobstructive CAD.  He saw Dr. Wynonia Lawman in the past, but no cardiology evaluation since 2017.  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  He has drunk  moderate ETOH in the past (no longer drinks) but does not seem to have drunk enough to cause a cardiomyopathy.  He was markedly volume overloaded on exam initially with biventricular failure and NYHA class IV at admission with rise in creatinine concerning for cardiorenal syndrome. Initial co-ox 40% suggested low CO, milrinone started and increased to 0.375.  He was begun on Lasix gtt at 12 mg/hr and later stopped.  RHC showed R>L heart failure.  Cardiac output looked good on milrinone by Fick but was low by thermodilution (discordant values).  Repeat LHC/RHC showed very low filling pressures and good cardiac index by Fick.  No significant coronary disease noted.  Today, CVP 4-5 with co-ox 63%.  -  Continue to hold Lasix today  - Continue milrinone 0.375 mcg/kg/min.     - Continue digoxin, level ok recently.   - Continue Bidil 1 tab tid.  - Continue spironolactone 25 mg  daily.   - Continue Toprol XL to 25 mg daily with volume overload and low output (also with low HR at times).  - We are working him up for LVAD.  CVP remains low, suspect his RV would support LVAD.  However, he also has cirrhosis which will be a consideration.  INR was 1.2 and albumen 3.2 so probably not severe liver dysfunction => GI saw this admit, Ardine Eng A and think he could handle surgery.  PFTs with minimal obstruction despite smoking history.  He is from [facility] but would stay with his daughter in [place]. There was question of pancreatic lesion but pancreatic protocol MRI showed no lesion.  Anticipate LVAD placement later this admission, plan for Monday.   2. AKI on CKD stage 3: Suspect this may be a combination of cardiorenal syndrome and contrast nephropathy (had CTA chest at admission).  Minimal contrast with coronary angiography, creatinine stable at 1.12 today.  3. 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  He was drinking moderate ETOH as well but has quit completely for about 1  month. INR 1.2, albumen 3.2. NH3 was normal. Ardine Eng A per GI and should be stable for surgery.  4. COPD/smoking: He recently quit smoking.  CT chest with moderate emphysema. He is now off oxygen. Minimal obstruction on PFTs.  5. Confusion:  Completely resolved.  Had overnight 10/14-15.  NH3 was normal, doubt hepatic encephalopathy.  No recent ETOH, not due to ETOH withdrawal.  Think may have been due to low output and hospitalization.  CT head unremarkable.  6. Hyponatremia: Na 132. Continue to fluid restrict, 1500 cc.  7. Aortic insufficiency: ?Moderate range.   LVAD placement tentatively scheduled for 10/26   Length of Stay: [address] [place]  11/13/2018, 8:21 AM  Advanced Heart Failure Team Pager [phone] (M-F; 7a - 4p)  Please contact [facility] for night-coverage after hours (4p -7a ) and weekends on [email or web address]  Patient seen with PA, agree with the above note.   He is doing well today, CVP 4-5 with co-ox 63%.  No complaints, on room air.    General: NAD Neck: No JVD, no thyromegaly or thyroid nodule.  Lungs: Clear to auscultation bilaterally with normal respiratory effort. CV: Nondisplaced PMI.  Heart regular S1/S2, no S3/S4, no murmur.  No peripheral edema.   Abdomen: Soft, nontender, no hepatosplenomegaly, no distention.  Skin: Intact without lesions or rashes.  Neurologic: Alert and oriented x 3.  Psych: Normal affect. Extremities: No clubbing or cyanosis.  HEENT: Normal.   He was seen by GI yesterday, Childs A cirrhosis, should be ok for surgery.   No diuretic today.   NYHA class IV and low output at admission, started on milrinone.  Continue milrinone 0.375.   Plan for LVAD placement on Monday.   Loralie Champagne 11/13/2018 10:53 AM

## 2018-11-13 NOTE — Progress Notes (Addendum)
Antwaine Boomhower has been discussed with the VAD Medical Review board on 11/10/2018. The team feels as if the patient is a good candidate for Destination LVAD therapy. The patient meets criteria for a LVAD implant as listed below:  1)  Has failed to respond to optimal medical management (including beta-blockers and ACE inhibitors if tolerated) for at least 45 of the last 60 days, or have been balloon dependent for 7 days, or IV inotrope dependent for 14 days; and,       *On Inotropes Milrinone started 11/05/2018  2)  Has a left ventricular ejection fraction (LVEF) < 25% and, have demonstrated functional limitation with a peak oxygen consumption of <14 ml/kg/min unless balloon pump or inotrope dependent or physically unable to perform the test.         *EF 20-25%  By echo (date)  11/05/2018       3)  Social work and palliative care evaluations demonstrate appropriate support system in place for discharge to home with a VAD and that end of life discussions have taken place. Both services have expressed no concern regarding patient's candidacy.         *Social work consult (date): 11/01/2018        *Palliative Care Consult (date): 11/10/2018  4)  Primary caretaker identified that can be taught along with the patient how to manage        the VAD equipment.        *Name: Matthan Sledge (wife) and Zayde Stroupe (daughter)  5)  Deemed appropriate by our financial coordinator: Cecilio Asper        Prior approval: Authorization #: [id number]  4)  VAD Coordinator, Tanda Rockers has met with patient and caregiver, shown them the VAD equipment and discussed with the patient and caregiver about lifestyle changes necessary for success on mechanical circulatory device.        *Met with on 11/07/2018       *Consent for VAD Evaluation/Caregiver Agreement/HIPPA Release/Photo Release signed on 11/07/2018  7)  Patient has been discussed with [facility] (Dr.Devore) and felt to be an appropriate candidate.    8)   Intermacs profile: 2  INTERMACS 1: Critical cardiogenic shock describes a patient who is "crashing and burning", in which a patient has life-threatening hypotension and rapidly escalating inotropic pressor support, with critical organ hypoperfusion often confirmed by worsening acidosis and lactate levels.  INTERMACS 2: Progressive decline describes a patient who has been demonstrated "dependent" on inotropic support but nonetheless shows signs of continuing deterioration in nutrition, renal function, fluid retention, or other major status indicator. Patient profile 2 can also describe a patient with refractory volume overload, perhaps with evidence of impaired perfusion, in whom inotropic infusions cannot be maintained due to tachyarrhythmias, clinical ischemia, or other intolerance.  INTERMACS 3: Stable but inotrope dependent describes a patient who is clinically stable on mild-moderate doses of intravenous inotropes (or has a temporary circulatory support device) after repeated documentation of failure to wean without symptomatic hypotension, worsening symptoms, or progressive organ dysfunction (usually renal). It is critical to monitor nutrition, renal function, fluid balance, and overall status carefully in order to distinguish between a  patient who is truly stable at Patient Profile 3 and a patient who has unappreciated decline rendering them Patient Profile 2. This patient may be either at home or in the hospital.   INTERMACS 4: Resting symptoms describes a patient who is at home on oral therapy but frequently has symptoms of congestion at rest or  with activities of daily living (ADL). He or she may have orthopnea, shortness of breath during ADL such as dressing or bathing, gastrointestinal symptoms (abdominal discomfort, nausea, poor appetite), disabling ascites or severe lower extremity edema. This patient should be carefully considered for more intensive management and surveillance  programs, which may in some cases, reveal poor compliance that would compromise outcomes with any therapy.  .  INTERMACS 5: Exertion Intolerant describes a patient who is comfortable at rest but unable to engage in any activity, living predominantly within the house or housebound. This patient has no congestive symptoms, but may have chronically elevated volume status, frequently with renal dysfunction, and may be characterized as exercise intolerant.   INTERMACS 6: Exertion Limited also describes a patient who is comfortable at rest without evidence of fluid overload, but who is able to do some mild activity. Activities of daily living are comfortable and minor activities outside the home such as visiting friends or going to a restaurant can be performed, but fatigue results within a few minutes of any meaningful physical exertion. This patient has occasional episodes of worsening symptoms and is likely to have had a hospitalization for heart failure within the past year.   INTERMACS 7: Advanced NYHA Class 3 describes a patient who is clinically stable with a reasonable level of comfortable activity, despite history of previous decompensation that is not recent. This patient is usually able to walk more than a block. Any decompensation requiring intravenous diuretics or hospitalization within the previous month should make this person a Patient Profile 6 or lower.    9)  NYHA Class: IV   Emerson Monte RN [place] Coordinator  Office: [phone]  24/7 Pager: [phone]

## 2018-11-13 NOTE — Progress Notes (Signed)
PROGRESS NOTE    Keith Nguyen.  [id number] DOB: Feb 17, 1950 DOA: 11/11/2018 PCP: [name], MD   Brief Narrative: 68 year old with past medical history significant for systolic heart failure ejection fraction 35 to 40%, hypertension, CKD stage III, hepatitis C cirrhosis treated with Harvoni COPD tobacco alcohol use quit last month, visiting daughter in [place] presented to the ED on 10/12 with progressive dyspnea associated with nonproductive cough and fluid retention for 3 days.  Evaluation in the ED patient was noted to be in heart failure, CT angio was done and it was negative for PE did reveal moderate emphysema, 4.5 cm ascending aorta,  anasarca and a small left pleural effusion.  Patient was evaluated by cardiology for acute systolic heart failure exacerbation, managed with Lasix drip as well as milrinone drip.  Echo this admission showed lower ejection fraction 20 to 25%.  Initially there was rise in creatinine concerning for cardiorenal syndrome which improved with milrinone drip.  Patient underwent left heart cath/pulmonary function test for evaluation of LVAD.  Plan for LVAD probably the end of the week.  GI has been consulted for evaluation of cirrhosis in anticipation of LVAD.    Assessment & Plan:   Principal Problem:   Acute on chronic congestive heart failure (HCC) Active Problems:   Hyperlipidemia   History of hepatitis C   Essential hypertension   Acute on chronic systolic (congestive) heart failure (HCC)   Protein-calorie malnutrition, severe   DNR no code (do not resuscitate)   Other fatigue  1-Acute on chronic systolic heart failure with acute hypoxic respiratory failure; Heart failure team following. Continue with milrinone drip. Continue with digoxin, spironolactone metoprolol BiDil. Lasix on hold. Status post right heart cath and left heart cath this admission. Plan for probably LVAD the end of the week.    2-Cardiorenal syndrome with AKI on chronic  kidney disease stage III: Creatinine improved and is stable. Lasix and losartan on hold.  3-Hepatitis C liver cirrhosis: Hepatitis C RNA quantitative confirm hepatitis C clearance. Patient to be evaluated by GI today.  On Aldactone. Per GI patient is child A cirrhotic, his risk should be  acceptable.  4-Hypertension: On BiDil, spironolactone.  5-Hyponatremia, improving. 6-Delirium: Resolved thought to be related to medication 7-sleep apnea: Pulse oxygen therapy while sleeping.  Will need home oxygen at discharge 8-severe protein caloric malnutrition: Low albumin.  Seen by nutritionist 9-constipation; schedule miralax, add senna       Nutrition Problem: Severe Malnutrition Etiology: chronic illness(CHF, COPD)    Signs/Symptoms: severe fat depletion, severe muscle depletion, percent weight loss(19.5% weight loss in less than 8 months) Percent weight loss: 19.5 %(less than 8 months)    Interventions: Ensure Enlive (each supplement provides 350kcal and 20 grams of protein), Magic cup, Liberalize Diet  Estimated body mass index is 20.51 kg/m as calculated from the following:   Height as of this encounter: 5\' 11"  (1.803 m).   Weight as of this encounter: 66.7 kg.   DVT prophylaxis: Heparin Code Status: Full code Family Communication: Care discussed with patient Disposition Plan: Remain in the hospital on IV Miradon, he will require LVAD Consultants:   Cardiology  Procedures:  Left and right cath Echo Antimicrobials:    Subjective: Denies worsening dyspnea. No BM in several days. Denies abdominal pain   Objective: Vitals:   11/13/18 0010 11/13/18 0407 11/13/18 0800 11/13/18 0842  BP: 104/79 105/79  112/85  Pulse: (!) 104 (!) 101  96  Resp: 20 (!) 26 17 (!) 22  Temp: 98.2 F (36.8 C) 97.8 F (36.6 C)  98.4 F (36.9 C)  TempSrc: Oral Oral  Oral  SpO2: 100% 98% 98% 98%  Weight:  66.7 kg    Height:        Intake/Output Summary (Last 24 hours) at  11/13/2018 0851 Last data filed at 11/13/2018 0800 Gross per 24 hour  Intake 762.76 ml  Output 1600 ml  Net -837.24 ml   Filed Weights   11/12/18 0503 11/12/18 0509 11/13/18 0407  Weight: 66.5 kg 66 kg 66.7 kg    Examination:  General exam: NAD Respiratory system: CTA Cardiovascular system: S 1, S 2 RRR Gastrointestinal system: BS present, soft, nt Central nervous system: non focal.  Extremities: symmetric power Skin:  No rashes   Data Reviewed: I have personally reviewed following labs and imaging studies  CBC: Recent Labs  Lab 11/09/18 0410 11/10/18 0531 11/02/2018 0505 11/13/2018 0818 11/12/18 0458 11/13/18 0251  WBC 5.3 5.0 5.5  --  4.8 5.5  NEUTROABS 2.7 2.5 2.7  --  2.6 3.0  HGB 17.2* 16.4 16.9 17.7*  17.7* 15.7 15.1  HCT 49.0 48.5 47.8 52.0  52.0 45.2 44.9  MCV 86.7 87.4 85.5  --  87.1 86.8  PLT 222 201 186  --  161 [id number]   Basic Metabolic Panel: Recent Labs  Lab 11/02/2018 1807  11/08/18 0415 11/08/18 0757 11/09/18 0410 11/10/18 0531 10/26/2018 0505 11/15/2018 0818 11/12/18 0458 11/13/18 0251  NA  --    < > 124*  --  129* 129* 127* 130*  131* 131* 132*  K  --    < > 3.7  --  4.3 4.6 4.9 4.3  4.3 4.6 4.7  CL  --    < > 79*  --  86* 89* 91*  --  96* 96*  CO2  --    < > 32  --  31 29 26   --  26 26  GLUCOSE  --    < > 124*  --  104* 125* 99  --  100* 106*  BUN  --    < > 32*  --  29* 33* 34*  --  30* 28*  CREATININE  --    < > 1.42*  --  1.33* 1.37* 1.38*  --  1.13 1.12  CALCIUM  --    < > 9.3  --  9.4 9.7 9.8  --  9.3 9.2  MG 1.8  --  19.7* 2.1  --   --   --   --   --   --    < > = values in this interval not displayed.   GFR: Estimated Creatinine Clearance: 58.7 mL/min (by C-G formula based on SCr of 1.12 mg/dL). Liver Function Tests: Recent Labs  Lab 11/08/18 0415 11/09/18 0410 11/10/18 0531 11/08/2018 0505 11/12/18 0458  AST 104* 93* 64* 55* 46*  ALT 57* 50* 41 32 29  ALKPHOS 71 74 74 64 70  BILITOT 1.4* 1.3* 1.4* 2.0* 1.8*  PROT 7.4 8.2*  8.3* 7.5 7.6  ALBUMIN 3.0* 3.2* 3.3* 3.1* 2.9*   Recent Labs  Lab 11/07/18 1645  LIPASE 35  AMYLASE 176*   No results for input(s): AMMONIA in the last 168 hours. Coagulation Profile: Recent Labs  Lab 11/05/2018 1056 11/08/18 0415  INR 1.7* 1.2   Cardiac Enzymes: No results for input(s): CKTOTAL, CKMB, CKMBINDEX, TROPONINI in the last 168 hours. BNP (last 3 results) No results for input(s): PROBNP in the last  8760 hours. HbA1C: No results for input(s): HGBA1C in the last 72 hours. CBG: No results for input(s): GLUCAP in the last 168 hours. Lipid Profile: No results for input(s): CHOL, HDL, LDLCALC, TRIG, CHOLHDL, LDLDIRECT in the last 72 hours. Thyroid Function Tests: No results for input(s): TSH, T4TOTAL, FREET4, T3FREE, THYROIDAB in the last 72 hours. Anemia Panel: No results for input(s): VITAMINB12, FOLATE, FERRITIN, TIBC, IRON, RETICCTPCT in the last 72 hours. Sepsis Labs: No results for input(s): PROCALCITON, LATICACIDVEN in the last 168 hours.  Recent Results (from the past 240 hour(s))  SARS CORONAVIRUS 2 (TAT 6-24 HRS) Nasopharyngeal Nasopharyngeal Swab     Status: None   Collection Time: 11/04/18 12:29 AM   Specimen: Nasopharyngeal Swab  Result Value Ref Range Status   SARS Coronavirus 2 NEGATIVE NEGATIVE Final    Comment: (NOTE) SARS-CoV-2 target nucleic acids are NOT DETECTED. The SARS-CoV-2 RNA is generally detectable in upper and lower respiratory specimens during the acute phase of infection. Negative results do not preclude SARS-CoV-2 infection, do not rule out co-infections with other pathogens, and should not be used as the sole basis for treatment or other patient management decisions. Negative results must be combined with clinical observations, patient history, and epidemiological information. The expected result is Negative. Fact Sheet for Patients: SugarRoll.be Fact Sheet for Healthcare Providers:  https://www.woods-mathews.com/ This test is not yet approved or cleared by the [place] FDA and  has been authorized for detection and/or diagnosis of SARS-CoV-2 by FDA under an Emergency Use Authorization (EUA). This EUA will remain  in effect (meaning this test can be used) for the duration of the COVID-19 declaration under Section 56 4(b)(1) of the Act, 21 U.S.C. section 360bbb-3(b)(1), unless the authorization is terminated or revoked sooner. Performed at [facility], [place] [address] [place], [place] [address]   MRSA PCR Screening     Status: None   Collection Time: 11/05/18  2:15 PM   Specimen: Nasal Mucosa; Nasopharyngeal  Result Value Ref Range Status   MRSA by PCR NEGATIVE NEGATIVE Final    Comment:        The GeneXpert MRSA Assay (FDA approved for NASAL specimens only), is one component of a comprehensive MRSA colonization surveillance program. It is not intended to diagnose MRSA infection nor to guide or monitor treatment for MRSA infections. Performed at [facility], [place] [address] [place], [place] [address]   Surgical pcr screen     Status: None   Collection Time: 11/10/18  9:55 AM   Specimen: Nasal Mucosa; Nasal Swab  Result Value Ref Range Status   MRSA, PCR NEGATIVE NEGATIVE Final   Staphylococcus aureus NEGATIVE NEGATIVE Final    Comment: (NOTE) The Xpert SA Assay (FDA approved for NASAL specimens in patients 17 years of age and older), is one component of a comprehensive surveillance program. It is not intended to diagnose infection nor to guide or monitor treatment. Performed at [facility], [place] [address] [place], [place] [address]          Radiology Studies: No results found.      Scheduled Meds: . allopurinol  100 mg Oral Daily  . aspirin EC  81 mg Oral Daily  . Chlorhexidine Gluconate Cloth  6 each Topical Daily  . digoxin  0.125 mg Oral Daily  . feeding supplement (ENSURE ENLIVE)   237 mL Oral TID WC  . heparin  5,000 Units Subcutaneous Q8H  . isosorbide-hydrALAZINE  1 tablet Oral TID  .  loratadine  10 mg Oral Daily  . mouth rinse  15 mL Mouth Rinse BID  . metoprolol succinate  25 mg Oral Daily  . sodium chloride flush  10-40 mL Intracatheter Q12H  . sodium chloride flush  3 mL Intravenous Q12H  . sodium chloride flush  3 mL Intravenous Q12H  . sodium chloride flush  3 mL Intravenous Q12H  . spironolactone  25 mg Oral Daily  . thiamine  100 mg Intravenous Daily   Continuous Infusions: . sodium chloride    . milrinone 0.375 mcg/kg/min (11/12/18 1800)     LOS: 9 days    Time spent: 35 minutes    Elmarie Shiley, MD [facility] Pager [phone]  If 7PM-7AM, please contact night-coverage [email or web address] Password [facility] 11/13/2018, 8:51 AM

## 2018-11-14 DIAGNOSIS — I509 Heart failure, unspecified: Secondary | ICD-10-CM | POA: Diagnosis not present

## 2018-11-14 LAB — CBC WITH DIFFERENTIAL/PLATELET
Abs Immature Granulocytes: 0.02 10*3/uL (ref 0.00–0.07)
Basophils Absolute: 0 10*3/uL (ref 0.0–0.1)
Basophils Relative: 0 %
Eosinophils Absolute: 0.2 10*3/uL (ref 0.0–0.5)
Eosinophils Relative: 4 %
HCT: 44.5 % (ref 39.0–52.0)
Hemoglobin: 14.7 g/dL (ref 13.0–17.0)
Immature Granulocytes: 0 %
Lymphocytes Relative: 25 %
Lymphs Abs: 1.2 10*3/uL (ref 0.7–4.0)
MCH: 29.5 pg (ref 26.0–34.0)
MCHC: 33 g/dL (ref 30.0–36.0)
MCV: 89.4 fL (ref 80.0–100.0)
Monocytes Absolute: 0.7 10*3/uL (ref 0.1–1.0)
Monocytes Relative: 14 %
Neutro Abs: 2.7 10*3/uL (ref 1.7–7.7)
Neutrophils Relative %: 57 %
Platelets: 167 10*3/uL (ref 150–400)
RBC: 4.98 MIL/uL (ref 4.22–5.81)
RDW: 15.6 % — ABNORMAL HIGH (ref 11.5–15.5)
WBC: 4.7 10*3/uL (ref 4.0–10.5)
nRBC: 0 % (ref 0.0–0.2)

## 2018-11-14 LAB — COOXEMETRY PANEL
Carboxyhemoglobin: 1.3 % (ref 0.5–1.5)
Methemoglobin: 1 % (ref 0.0–1.5)
O2 Saturation: 63 %
Total hemoglobin: 15.2 g/dL (ref 12.0–16.0)

## 2018-11-14 LAB — BASIC METABOLIC PANEL
Anion gap: 10 (ref 5–15)
BUN: 22 mg/dL (ref 8–23)
CO2: 23 mmol/L (ref 22–32)
Calcium: 9.1 mg/dL (ref 8.9–10.3)
Chloride: 98 mmol/L (ref 98–111)
Creatinine, Ser: 1.11 mg/dL (ref 0.61–1.24)
GFR calc Af Amer: >60 mL/min (ref >60)
GFR calc non Af Amer: >60 mL/min (ref >60)
Glucose, Bld: 99 mg/dL (ref 70–99)
Potassium: 4.8 mmol/L (ref 3.5–5.1)
Sodium: 131 mmol/L — ABNORMAL LOW (ref 135–145)

## 2018-11-14 MED ORDER — MILRINONE LACTATE IN DEXTROSE 20-5 MG/100ML-% IV SOLN
0.3000 ug/kg/min | INTRAVENOUS | Status: AC
  Administered 2018-11-17: 08:00:00 0.375 ug/kg/min via INTRAVENOUS
  Filled 2018-11-14 (×2): qty 100

## 2018-11-14 MED ORDER — SODIUM CHLORIDE 0.9 % IV SOLN
750.0000 mg | INTRAVENOUS | Status: DC
  Filled 2018-11-14: qty 750

## 2018-11-14 MED ORDER — TRANEXAMIC ACID (OHS) PUMP PRIME SOLUTION
2.0000 mg/kg | INTRAVENOUS | Status: DC
  Filled 2018-11-14: qty 1.35

## 2018-11-14 MED ORDER — DEXMEDETOMIDINE HCL IN NACL 400 MCG/100ML IV SOLN
0.1000 ug/kg/h | INTRAVENOUS | Status: AC
  Administered 2018-11-17: 09:00:00 0.7 ug/kg/h via INTRAVENOUS
  Filled 2018-11-14: qty 100

## 2018-11-14 MED ORDER — EPINEPHRINE HCL 5 MG/250ML IV SOLN IN NS
0.0000 ug/min | INTRAVENOUS | Status: AC
  Administered 2018-11-17: 11:00:00 3 ug/min via INTRAVENOUS
  Filled 2018-11-14: qty 250

## 2018-11-14 MED ORDER — SODIUM CHLORIDE 0.9 % IV SOLN
1.5000 g | INTRAVENOUS | Status: AC
  Administered 2018-11-17: 1.5 g via INTRAVENOUS
  Filled 2018-11-14: qty 1.5

## 2018-11-14 MED ORDER — PHENYLEPHRINE HCL-NACL 20-0.9 MG/250ML-% IV SOLN
0.0000 ug/min | INTRAVENOUS | Status: AC
  Administered 2018-11-17: 08:00:00 20 ug/min via INTRAVENOUS
  Filled 2018-11-14: qty 250

## 2018-11-14 MED ORDER — GLUTARALDEHYDE 0.625% SOAKING SOLUTION
TOPICAL | Status: DC
  Filled 2018-11-14: qty 50

## 2018-11-14 MED ORDER — VANCOMYCIN HCL 10 G IV SOLR
1250.0000 mg | INTRAVENOUS | Status: AC
  Administered 2018-11-17: 08:00:00 1250 mg via INTRAVENOUS
  Filled 2018-11-14: qty 1250

## 2018-11-14 MED ORDER — TRANEXAMIC ACID (OHS) BOLUS VIA INFUSION
15.0000 mg/kg | INTRAVENOUS | Status: AC
  Administered 2018-11-17: 08:00:00 1011 mg via INTRAVENOUS
  Filled 2018-11-14: qty 1011

## 2018-11-14 MED ORDER — VASOPRESSIN 20 UNIT/ML IV SOLN
0.0400 [IU]/min | INTRAVENOUS | Status: DC
  Filled 2018-11-14: qty 2

## 2018-11-14 MED ORDER — SODIUM CHLORIDE 0.9 % IV SOLN
INTRAVENOUS | Status: DC
  Filled 2018-11-14: qty 30

## 2018-11-14 MED ORDER — VANCOMYCIN HCL 1 G IV SOLR
1000.0000 mg | INTRAVENOUS | Status: DC
  Filled 2018-11-14: qty 1000

## 2018-11-14 MED ORDER — TRANEXAMIC ACID 1000 MG/10ML IV SOLN
1.5000 mg/kg/h | INTRAVENOUS | Status: AC
  Administered 2018-11-17: 09:00:00 1.5 mg/kg/h via INTRAVENOUS
  Filled 2018-11-14: qty 25

## 2018-11-14 MED ORDER — POTASSIUM CHLORIDE 2 MEQ/ML IV SOLN
80.0000 meq | INTRAVENOUS | Status: DC
  Filled 2018-11-14: qty 40

## 2018-11-14 MED ORDER — MAGNESIUM SULFATE 50 % IJ SOLN
40.0000 meq | INTRAMUSCULAR | Status: DC
  Filled 2018-11-14: qty 9.85

## 2018-11-14 MED ORDER — INSULIN REGULAR(HUMAN) IN NACL 100-0.9 UT/100ML-% IV SOLN
INTRAVENOUS | Status: AC
  Administered 2018-11-17: 08:00:00 1 [IU]/h via INTRAVENOUS
  Filled 2018-11-14: qty 100

## 2018-11-14 MED ORDER — DOPAMINE-DEXTROSE 3.2-5 MG/ML-% IV SOLN
0.0000 ug/kg/min | INTRAVENOUS | Status: DC
  Filled 2018-11-14: qty 250

## 2018-11-14 MED ORDER — NOREPINEPHRINE 4 MG/250ML-% IV SOLN
0.0000 ug/min | INTRAVENOUS | Status: AC
  Administered 2018-11-17: 11:00:00 3 ug/min via INTRAVENOUS
  Filled 2018-11-14: qty 250

## 2018-11-14 MED ORDER — BISACODYL 5 MG PO TBEC
5.0000 mg | DELAYED_RELEASE_TABLET | Freq: Every day | ORAL | Status: DC | PRN
  Administered 2018-11-14: 5 mg via ORAL
  Filled 2018-11-14: qty 1

## 2018-11-14 MED ORDER — NITROGLYCERIN IN D5W 200-5 MCG/ML-% IV SOLN
0.0000 ug/min | INTRAVENOUS | Status: AC
  Administered 2018-11-17: 13:00:00 10 ug/min via INTRAVENOUS
  Filled 2018-11-14: qty 250

## 2018-11-14 MED ORDER — FLUCONAZOLE IN SODIUM CHLORIDE 400-0.9 MG/200ML-% IV SOLN
400.0000 mg | INTRAVENOUS | Status: AC
  Administered 2018-11-17: 08:00:00 400 mg via INTRAVENOUS
  Filled 2018-11-14: qty 200

## 2018-11-14 MED ORDER — BISACODYL 10 MG RE SUPP: 10.0000 mg | Freq: Every day | RECTAL | Status: DC | PRN

## 2018-11-14 MED ORDER — SODIUM CHLORIDE 0.9 % IV SOLN
600.0000 mg | INTRAVENOUS | Status: AC
  Administered 2018-11-17: 08:00:00 600 mg via INTRAVENOUS
  Filled 2018-11-14: qty 600

## 2018-11-14 MED ORDER — DOBUTAMINE IN D5W 4-5 MG/ML-% IV SOLN
2.0000 ug/kg/min | INTRAVENOUS | Status: DC
  Filled 2018-11-14: qty 250

## 2018-11-14 NOTE — Progress Notes (Signed)
Patient ID: Keith Angstadt., male   DOB: Apr 17, 1950, 68 y.o.   MRN: [id number]     Advanced Heart Failure Rounding Note  PCP-Cardiologist: No primary care provider on file.   Subjective:    No complaints this morning. Feels well. Walking in the hall.   He remains on milrinone 0.375 mcg. Co-ox 63%. Creatinine 1.11.  CVP 6 today.  CT chest with moderate emphysema, 4.5 cm ascending aorta.   CT abdomen with cirrhosis, ?gallstone, subtle 10 x 10 mm lesion head/neck pancreas.   MRI abdomen pancreas protocol: No pancreatic lesion, gallstones in gallbladder without cholecystitis.   Limited echo with Definity was reviewed, I do not see LV thrombus.   PFTs with minimal obstruction.   RHC Procedural Findings (milrinone 0.375): Hemodynamics (mmHg) RA mean 14 RV 46/13 PA 49/20, 31 PCWP mean 16 Oxygen saturations: PA 70% AO 93% Cardiac Output (Fick) 5.88  Cardiac Index (Fick) 3.06 PVR 2.55 PAPI 2.07 CVP/PCWP 0.875 Cardiac Output (Thermo) 3.16 Cardiac Index (Thermo) 1.64  RHC/LHC (11/07/2018):  Coronary Findings  Diagnostic Dominance: Left Left Main  No significant disease.  Left Anterior Descending  Luminal irregularities.  Left Circumflex  Large vessel supplies left PDA. Luminal irregularities.  Right Coronary Artery  Small, nondominant vessel. 30% stenosis proximally.  Intervention  No interventions have been documented. Right Heart  Right Heart Pressures RHC Procedural Findings (milrinone 0.375): Hemodynamics (mmHg) RA mean 1 RV 25/1 PA 25/2 mean 15 PCWP mean 8 LV 108/1 AO 108/73  Oxygen saturations: PA 74% AO 96%  Cardiac Output (Fick) 4.81  Cardiac Index (Fick) 2.63      Objective:   Weight Range: 67.4 kg Body mass index is 20.72 kg/m.   Vital Signs:   Temp:  [97.8 F (36.6 C)-98.7 F (37.1 C)] 98.7 F (37.1 C) (10/23 1201) Pulse Rate:  [75-107] 98 (10/23 1201) Resp:  [17-21] 20 (10/23 1200) BP: (97-126)/(78-86) 114/85 (10/23 1200) SpO2:   [97 %-100 %] 98 % (10/23 1200) Weight:  [67.4 kg] 67.4 kg (10/23 0350) Last BM Date: 11/09/18  Weight change: Filed Weights   11/12/18 0509 11/13/18 0407 11/14/18 0350  Weight: 66 kg 66.7 kg 67.4 kg    Intake/Output:   Intake/Output Summary (Last 24 hours) at 11/14/2018 1217 Last data filed at 11/14/2018 1200 Gross per 24 hour  Intake 544.09 ml  Output 2200 ml  Net -1655.91 ml      Physical Exam  CVP 6 General: NAD Neck: No JVD, no thyromegaly or thyroid nodule.  Lungs: Clear to auscultation bilaterally with normal respiratory effort. CV: Lateral PMI.  Heart regular S1/S2, no S3/S4, no murmur.  No peripheral edema. Abdomen: Soft, nontender, no hepatosplenomegaly, no distention.  Skin: Intact without lesions or rashes.  Neurologic: Alert and oriented x 3.  Psych: Normal affect. Extremities: No clubbing or cyanosis.  HEENT: Normal.    Telemetry   NSR 90s. Personally reviewed   Labs    CBC Recent Labs    11/13/18 0251 11/14/18 0413  WBC 5.5 4.7  NEUTROABS 3.0 2.7  HGB 15.1 14.7  HCT 44.9 44.5  MCV 86.8 89.4  PLT 174 [id number]   Basic Metabolic Panel Recent Labs    11/13/18 0251 11/14/18 0413  NA 132* 131*  K 4.7 4.8  CL 96* 98  CO2 26 23  GLUCOSE 106* 99  BUN 28* 22  CREATININE 1.12 1.11  CALCIUM 9.2 9.1   Liver Function Tests Recent Labs    11/12/18 0458  AST 46*  ALT 29  ALKPHOS 70  BILITOT 1.8*  PROT 7.6  ALBUMIN 2.9*   No results for input(s): LIPASE, AMYLASE in the last 72 hours. Cardiac Enzymes No results for input(s): CKTOTAL, CKMB, CKMBINDEX, TROPONINI in the last 72 hours.  BNP: BNP (last 3 results) Recent Labs    11/01/2018 2019  BNP >4,500.0*    ProBNP (last 3 results) No results for input(s): PROBNP in the last 8760 hours.   D-Dimer No results for input(s): DDIMER in the last 72 hours. Hemoglobin A1C No results for input(s): HGBA1C in the last 72 hours. Fasting Lipid Panel No results for input(s): CHOL, HDL, LDLCALC,  TRIG, CHOLHDL, LDLDIRECT in the last 72 hours. Thyroid Function Tests No results for input(s): TSH, T4TOTAL, T3FREE, THYROIDAB in the last 72 hours.  Invalid input(s): FREET3  Other results:   Imaging    No results found.   Medications:     Scheduled Medications: . allopurinol  100 mg Oral Daily  . aspirin EC  81 mg Oral Daily  . Chlorhexidine Gluconate Cloth  6 each Topical Daily  . digoxin  0.125 mg Oral Daily  . [START ON 11/16/2018] epinephrine  0-10 mcg/min Intravenous To OR  . feeding supplement (ENSURE ENLIVE)  237 mL Oral TID WC  . [START ON 10/25/2018] glutaraldehyde   Topical To OR  . heparin  5,000 Units Subcutaneous Q8H  . [START ON 11/08/2018] insulin   Intravenous To OR  . isosorbide-hydrALAZINE  1 tablet Oral TID  . loratadine  10 mg Oral Daily  . [START ON 10/29/2018] magnesium sulfate  40 mEq Other To OR  . mouth rinse  15 mL Mouth Rinse BID  . metoprolol succinate  25 mg Oral Daily  . [START ON 10/27/2018] phenylephrine  0-100 mcg/min Intravenous To OR  . polyethylene glycol  17 g Oral BID  . [START ON 10/24/2018] potassium chloride  80 mEq Other To OR  . senna-docusate  1 tablet Oral BID  . sodium chloride flush  10-40 mL Intracatheter Q12H  . sodium chloride flush  3 mL Intravenous Q12H  . sodium chloride flush  3 mL Intravenous Q12H  . sodium chloride flush  3 mL Intravenous Q12H  . spironolactone  25 mg Oral Daily  . thiamine  100 mg Intravenous Daily  . [START ON 11/08/2018] tranexamic acid  15 mg/kg Intravenous To OR  . [START ON 11/05/2018] tranexamic acid  2 mg/kg Intracatheter To OR  . [START ON 10/30/2018] vancomycin  1,000 mg Other To OR    Infusions: . sodium chloride    . [START ON 11/16/2018] cefUROXime (ZINACEF)  IV    . [START ON 10/24/2018] cefUROXime (ZINACEF)  IV    . [START ON 11/19/2018] dexmedetomidine    . [START ON 11/04/2018] DOBUTamine    . [START ON 11/15/2018] DOPamine    . [START ON 10/26/2018] fluconazole  (DIFLUCAN) IV    . [START ON 11/12/2018] heparin 30,000 units/NS 1000 mL solution for CELLSAVER    . milrinone 0.375 mcg/kg/min (11/14/18 1207)  . [START ON 11/04/2018] milrinone    . [START ON 11/04/2018] nitroGLYCERIN    . [START ON 11/13/2018] norepinephrine (LEVOPHED) Adult infusion    . [START ON 10/30/2018] rifampin (RIFADIN) IVPB    . [START ON 10/23/2018] tranexamic acid (CYKLOKAPRON) infusion ([facility])    . [START ON 11/07/2018] vancomycin    . [START ON 11/03/2018] vasopressin (PITRESSIN) infusion - *FOR SHOCK*      PRN Medications: sodium chloride, acetaminophen, albuterol, bisacodyl, bisacodyl, methocarbamol, nitroGLYCERIN,  ondansetron (ZOFRAN) IV, ondansetron **OR** [DISCONTINUED] ondansetron (ZOFRAN) IV, sodium chloride, sodium chloride flush, sodium chloride flush   Assessment/Plan   1. Acute on chronic systolic CHF: Nonischemic cardiomyopathy diagnosed in 2017 with echo showing EF 40% and LHC showing mild nonobstructive CAD.  He saw Dr. Wynonia Lawman in the past, but no cardiology evaluation since 2017.  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  He has drunk moderate ETOH in the past (no longer drinks) but does not seem to have drunk enough to cause a cardiomyopathy.  He was markedly volume overloaded on exam initially with biventricular failure and NYHA class IV at admission with rise in creatinine concerning for cardiorenal syndrome. Initial co-ox 40% suggested low CO, milrinone started and increased to 0.375.  He was begun on Lasix gtt at 12 mg/hr and later stopped.  RHC showed R>L heart failure.  Cardiac output looked good on milrinone by Fick but was low by thermodilution (discordant values).  Repeat LHC/RHC showed very low filling pressures and good cardiac index by Fick.  No significant coronary disease noted.  Today, CVP 6 with  co-ox 63%.  - Continue to hold Lasix today, follow CVP. - Continue milrinone 0.375 mcg/kg/min.     - Continue digoxin, level ok recently.   - Continue Bidil 1 tab tid.  - Continue spironolactone 25 mg daily.   - Continue Toprol XL to 25 mg daily with volume overload and low output (also with low HR at times).  - Plan for LVAD on Monday.  CVP remains low, suspect his RV would support LVAD.  However, he also has cirrhosis which will be a consideration.  INR was 1.2 and albumen 3.2 so probably not severe liver dysfunction => GI saw this admit, Ardine Eng A and think he could handle surgery.  PFTs with minimal obstruction despite smoking history.  He is from [facility] but would stay with his daughter in [place]. There was question of pancreatic lesion but pancreatic protocol MRI showed no lesion.  2. AKI on CKD stage 3: Suspect this may be a combination of cardiorenal syndrome and contrast nephropathy (had CTA chest at admission).  Minimal contrast with coronary angiography, creatinine stable at 1.11 today.  3. 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  He was drinking moderate ETOH as well but has quit completely for about 1 month. INR 1.2, albumen 3.2. NH3 was normal. Ardine Eng A per GI and should be stable for surgery.  4. COPD/smoking: He recently quit smoking.  CT chest with moderate emphysema. He is now off oxygen. Minimal obstruction on PFTs.  5. Confusion:  Completely resolved.  Had overnight 10/14-15.  NH3 was normal, doubt hepatic encephalopathy.  No recent ETOH, not due to ETOH withdrawal.  Think may have been due to low output and hospitalization.  CT head unremarkable.  6. Hyponatremia: Na 131. Continue to fluid restrict, 1500 cc.  7. Aortic insufficiency: ?Moderate range.   Length of Stay: [place], MD  11/14/2018, 12:17 PM  Advanced Heart Failure Team Pager [phone] (M-F; 7a - 4p)  Please contact [facility] for night-coverage  after hours (4p -7a ) and weekends on [email or web address]

## 2018-11-14 NOTE — Progress Notes (Signed)
CARDIAC REHAB PHASE I   PRE:  Rate/Rhythm: 102 ST  BP:  Supine: 110/76  Sitting:   Standing:    SaO2: 93%RA  MODE:  Ambulation: 800 ft   POST:  Rate/Rhythm: 112 ST  BP:  Supine:   Sitting:   Standing: 100/88   SaO2: 96%RA 1000-1048 Pt assisted to bathroom for BM and he then walked 800 ft on RA independently and tolerated well. Encouraged more walks this weekend. Discussed sternal precautions for after surgery.   Graylon Good, RN BSN  11/14/2018 10:45 AM

## 2018-11-14 NOTE — Progress Notes (Signed)
PROGRESS NOTE    Keith Nguyen.  [id number] DOB: Jul 26, 1950 DOA: 11/19/2018 PCP: [name], MD   Brief Narrative: 68 year old with past medical history significant for systolic heart failure ejection fraction 35 to 40%, hypertension, CKD stage III, hepatitis C cirrhosis treated with Harvoni COPD tobacco alcohol use quit last month, visiting daughter in [place] presented to the ED on 10/12 with progressive dyspnea associated with nonproductive cough and fluid retention for 3 days.  Evaluation in the ED patient was noted to be in heart failure, CT angio was done and it was negative for PE did reveal moderate emphysema, 4.5 cm ascending aorta,  anasarca and a small left pleural effusion.  Patient was evaluated by cardiology for acute systolic heart failure exacerbation, managed with Lasix drip as well as milrinone drip.  Echo this admission showed lower ejection fraction 20 to 25%.  Initially there was rise in creatinine concerning for cardiorenal syndrome which improved with milrinone drip.  Patient underwent left heart cath/pulmonary function test for evaluation of LVAD.  Plan for LVAD probably the end of the week.  GI has been consulted for evaluation of cirrhosis in anticipation of LVAD.    Assessment & Plan:   Principal Problem:   Acute on chronic congestive heart failure (HCC) Active Problems:   Hyperlipidemia   History of hepatitis C   Essential hypertension   Acute on chronic systolic (congestive) heart failure (HCC)   Protein-calorie malnutrition, severe   DNR no code (do not resuscitate)   Other fatigue   Palliative care by specialist  1-Acute on chronic systolic heart failure with acute hypoxic respiratory failure; Heart failure team following. Continue with milrinone drip. Continue with digoxin, spironolactone metoprolol BiDil. Lasix on hold. Status post right heart cath and left heart cath this admission. Plan for probably LVAD on Monday.  Management per  cardiology.   2-Cardiorenal syndrome with AKI on chronic kidney disease stage III: Creatinine improved and is stable. Lasix and losartan on hold.  3-Hepatitis C liver cirrhosis: Hepatitis C RNA quantitative confirm hepatitis C clearance. Patient to be evaluated by GI today.  On Aldactone. Per GI patient is child A cirrhotic, his risk should be  acceptable.  4-Hypertension: On BiDil, spironolactone.  5-Hyponatremia, improved.  6-Delirium: Resolved thought to be related to medication 7-sleep apnea: Pulse oxygen therapy while sleeping.  Will need home oxygen at discharge 8-severe protein caloric malnutrition: Low albumin.  Seen by nutritionist 9-Constipation; schedule miralax, added senna. No BM. Will try dulcolax oral and or suppository.        Nutrition Problem: Severe Malnutrition Etiology: chronic illness(CHF, COPD)    Signs/Symptoms: severe fat depletion, severe muscle depletion, percent weight loss(19.5% weight loss in less than 8 months) Percent weight loss: 19.5 %(less than 8 months)    Interventions: Ensure Enlive (each supplement provides 350kcal and 20 grams of protein), Magic cup, Liberalize Diet  Estimated body mass index is 20.72 kg/m as calculated from the following:   Height as of this encounter: 5\' 11"  (1.803 m).   Weight as of this encounter: 67.4 kg.   DVT prophylaxis: Heparin Code Status: Full code Family Communication: Care discussed with patient Disposition Plan: Remain in the hospital on IV Miradon, he will require LVAD Consultants:   Cardiology  Procedures:  Left and right cath Echo Antimicrobials:    Subjective: Denies worsening dyspnea.  No BM yet.  Denis abdominal pain  Objective: Vitals:   11/13/18 1522 11/13/18 1957 11/13/18 2346 11/14/18 0350  BP: 112/80 126/78 119/82 97/82  Pulse: 75 96 93 93  Resp: 19 19 (!) 21 17  Temp: 98 F (36.7 C) 97.8 F (36.6 C) 98.4 F (36.9 C) 98.1 F (36.7 C)  TempSrc: Oral Oral Oral Oral   SpO2: 100% 98% 99% 99%  Weight:    67.4 kg  Height:        Intake/Output Summary (Last 24 hours) at 11/14/2018 [id number] Last data filed at 11/14/2018 0400 Gross per 24 hour  Intake 736.89 ml  Output 2350 ml  Net -1613.11 ml   Filed Weights   11/12/18 0509 11/13/18 0407 11/14/18 0350  Weight: 66 kg 66.7 kg 67.4 kg    Examination:  General exam: NAD Respiratory system: CTA Cardiovascular system: S 1, S 2 RRR Gastrointestinal system: BS present, soft,, nt Central nervous system: Non focal.  Extremities: Symmetric power.  Skin:  No rashes.    Data Reviewed: I have personally reviewed following labs and imaging studies  CBC: Recent Labs  Lab 11/10/18 0531 11/15/2018 0505 11/08/2018 0818 11/12/18 0458 11/13/18 0251 11/14/18 0413  WBC 5.0 5.5  --  4.8 5.5 4.7  NEUTROABS 2.5 2.7  --  2.6 3.0 2.7  HGB 16.4 16.9 17.7*  17.7* 15.7 15.1 14.7  HCT 48.5 47.8 52.0  52.0 45.2 44.9 44.5  MCV 87.4 85.5  --  87.1 86.8 89.4  PLT 201 186  --  161 174 [id number]   Basic Metabolic Panel: Recent Labs  Lab 11/08/18 0415 11/08/18 0757  11/10/18 0531 11/09/2018 0505 11/06/2018 0818 11/12/18 0458 11/13/18 0251 11/14/18 0413  NA 124*  --    < > 129* 127* 130*  131* 131* 132* 131*  K 3.7  --    < > 4.6 4.9 4.3  4.3 4.6 4.7 4.8  CL 79*  --    < > 89* 91*  --  96* 96* 98  CO2 32  --    < > 29 26  --  26 26 23   GLUCOSE 124*  --    < > 125* 99  --  100* 106* 99  BUN 32*  --    < > 33* 34*  --  30* 28* 22  CREATININE 1.42*  --    < > 1.37* 1.38*  --  1.13 1.12 1.11  CALCIUM 9.3  --    < > 9.7 9.8  --  9.3 9.2 9.1  MG 19.7* 2.1  --   --   --   --   --   --   --    < > = values in this interval not displayed.   GFR: Estimated Creatinine Clearance: 59.9 mL/min (by C-G formula based on SCr of 1.11 mg/dL). Liver Function Tests: Recent Labs  Lab 11/08/18 0415 11/09/18 0410 11/10/18 0531 11/08/2018 0505 11/12/18 0458  AST 104* 93* 64* 55* 46*  ALT 57* 50* 41 32 29  ALKPHOS 71 74 74 64 70   BILITOT 1.4* 1.3* 1.4* 2.0* 1.8*  PROT 7.4 8.2* 8.3* 7.5 7.6  ALBUMIN 3.0* 3.2* 3.3* 3.1* 2.9*   Recent Labs  Lab 11/07/18 1645  LIPASE 35  AMYLASE 176*   No results for input(s): AMMONIA in the last 168 hours. Coagulation Profile: Recent Labs  Lab 11/08/18 0415  INR 1.2   Cardiac Enzymes: No results for input(s): CKTOTAL, CKMB, CKMBINDEX, TROPONINI in the last 168 hours. BNP (last 3 results) No results for input(s): PROBNP in the last 8760 hours. HbA1C: No results for input(s): HGBA1C in the last 72  hours. CBG: No results for input(s): GLUCAP in the last 168 hours. Lipid Profile: No results for input(s): CHOL, HDL, LDLCALC, TRIG, CHOLHDL, LDLDIRECT in the last 72 hours. Thyroid Function Tests: No results for input(s): TSH, T4TOTAL, FREET4, T3FREE, THYROIDAB in the last 72 hours. Anemia Panel: No results for input(s): VITAMINB12, FOLATE, FERRITIN, TIBC, IRON, RETICCTPCT in the last 72 hours. Sepsis Labs: No results for input(s): PROCALCITON, LATICACIDVEN in the last 168 hours.  Recent Results (from the past 240 hour(s))  MRSA PCR Screening     Status: None   Collection Time: 11/05/18  2:15 PM   Specimen: Nasal Mucosa; Nasopharyngeal  Result Value Ref Range Status   MRSA by PCR NEGATIVE NEGATIVE Final    Comment:        The GeneXpert MRSA Assay (FDA approved for NASAL specimens only), is one component of a comprehensive MRSA colonization surveillance program. It is not intended to diagnose MRSA infection nor to guide or monitor treatment for MRSA infections. Performed at [facility], [place] [address] [place], [place] [address]   Surgical pcr screen     Status: None   Collection Time: 11/10/18  9:55 AM   Specimen: Nasal Mucosa; Nasal Swab  Result Value Ref Range Status   MRSA, PCR NEGATIVE NEGATIVE Final   Staphylococcus aureus NEGATIVE NEGATIVE Final    Comment: (NOTE) The Xpert SA Assay (FDA approved for NASAL specimens in patients 38  years of age and older), is one component of a comprehensive surveillance program. It is not intended to diagnose infection nor to guide or monitor treatment. Performed at [facility], [place] [address], [place], [place] [address]          Radiology Studies: No results found.      Scheduled Meds: . allopurinol  100 mg Oral Daily  . aspirin EC  81 mg Oral Daily  . Chlorhexidine Gluconate Cloth  6 each Topical Daily  . digoxin  0.125 mg Oral Daily  . [START ON 10/25/2018] epinephrine  0-10 mcg/min Intravenous To OR  . feeding supplement (ENSURE ENLIVE)  237 mL Oral TID WC  . [START ON 10/31/2018] glutaraldehyde   Topical To OR  . heparin  5,000 Units Subcutaneous Q8H  . [START ON 10/29/2018] insulin   Intravenous To OR  . isosorbide-hydrALAZINE  1 tablet Oral TID  . loratadine  10 mg Oral Daily  . [START ON 11/16/2018] magnesium sulfate  40 mEq Other To OR  . mouth rinse  15 mL Mouth Rinse BID  . metoprolol succinate  25 mg Oral Daily  . [START ON 11/06/2018] phenylephrine  0-100 mcg/min Intravenous To OR  . polyethylene glycol  17 g Oral BID  . [START ON 11/16/2018] potassium chloride  80 mEq Other To OR  . senna-docusate  1 tablet Oral BID  . sodium chloride flush  10-40 mL Intracatheter Q12H  . sodium chloride flush  3 mL Intravenous Q12H  . sodium chloride flush  3 mL Intravenous Q12H  . sodium chloride flush  3 mL Intravenous Q12H  . spironolactone  25 mg Oral Daily  . thiamine  100 mg Intravenous Daily  . [START ON 11/08/2018] tranexamic acid  15 mg/kg Intravenous To OR  . [START ON 11/22/2018] tranexamic acid  2 mg/kg Intracatheter To OR  . [START ON 11/04/2018] vancomycin  1,000 mg Other To OR   Continuous Infusions: . sodium chloride    . [START ON 11/10/2018] cefUROXime (ZINACEF)  IV    . [  START ON 11/06/2018] cefUROXime (ZINACEF)  IV    . [START ON 10/25/2018] dexmedetomidine    . [START ON 10/26/2018] DOBUTamine    . [START ON 11/13/2018] DOPamine     . [START ON 11/03/2018] fluconazole (DIFLUCAN) IV    . [START ON 11/03/2018] heparin 30,000 units/NS 1000 mL solution for CELLSAVER    . milrinone 0.375 mcg/kg/min (11/13/18 2133)  . [START ON 11/13/2018] milrinone    . [START ON 11/12/2018] nitroGLYCERIN    . [START ON 11/15/2018] norepinephrine (LEVOPHED) Adult infusion    . [START ON 11/11/2018] rifampin (RIFADIN) IVPB    . [START ON 11/16/2018] tranexamic acid (CYKLOKAPRON) infusion ([facility])    . [START ON 10/25/2018] vancomycin    . [START ON 10/30/2018] vasopressin (PITRESSIN) infusion - *FOR SHOCK*       LOS: 10 days    Time spent: 35 minutes    Elmarie Shiley, MD [facility] Pager [phone]  If 7PM-7AM, please contact night-coverage [email or web address] Password [email or web address] 11/14/2018, 8:21 AM

## 2018-11-15 DIAGNOSIS — I5023 Acute on chronic systolic (congestive) heart failure: Secondary | ICD-10-CM | POA: Diagnosis not present

## 2018-11-15 LAB — CBC WITH DIFFERENTIAL/PLATELET
Abs Immature Granulocytes: 0.02 10*3/uL (ref 0.00–0.07)
Basophils Absolute: 0 10*3/uL (ref 0.0–0.1)
Basophils Relative: 1 %
Eosinophils Absolute: 0.2 10*3/uL (ref 0.0–0.5)
Eosinophils Relative: 5 %
HCT: 45.2 % (ref 39.0–52.0)
Hemoglobin: 15.1 g/dL (ref 13.0–17.0)
Immature Granulocytes: 0 %
Lymphocytes Relative: 29 %
Lymphs Abs: 1.4 10*3/uL (ref 0.7–4.0)
MCH: 29.6 pg (ref 26.0–34.0)
MCHC: 33.4 g/dL (ref 30.0–36.0)
MCV: 88.6 fL (ref 80.0–100.0)
Monocytes Absolute: 0.8 10*3/uL (ref 0.1–1.0)
Monocytes Relative: 16 %
Neutro Abs: 2.3 10*3/uL (ref 1.7–7.7)
Neutrophils Relative %: 49 %
Platelets: 185 10*3/uL (ref 150–400)
RBC: 5.1 MIL/uL (ref 4.22–5.81)
RDW: 15.6 % — ABNORMAL HIGH (ref 11.5–15.5)
WBC: 4.7 10*3/uL (ref 4.0–10.5)
nRBC: 0 % (ref 0.0–0.2)

## 2018-11-15 LAB — BASIC METABOLIC PANEL
Anion gap: 10 (ref 5–15)
BUN: 23 mg/dL (ref 8–23)
CO2: 23 mmol/L (ref 22–32)
Calcium: 9.6 mg/dL (ref 8.9–10.3)
Chloride: 97 mmol/L — ABNORMAL LOW (ref 98–111)
Creatinine, Ser: 1.12 mg/dL (ref 0.61–1.24)
GFR calc Af Amer: >60 mL/min (ref >60)
GFR calc non Af Amer: >60 mL/min (ref >60)
Glucose, Bld: 105 mg/dL — ABNORMAL HIGH (ref 70–99)
Potassium: 4.9 mmol/L (ref 3.5–5.1)
Sodium: 130 mmol/L — ABNORMAL LOW (ref 135–145)

## 2018-11-15 LAB — COOXEMETRY PANEL
Carboxyhemoglobin: 1.2 % (ref 0.5–1.5)
Methemoglobin: 1 % (ref 0.0–1.5)
O2 Saturation: 68.8 %
Total hemoglobin: 15.6 g/dL (ref 12.0–16.0)

## 2018-11-15 NOTE — Progress Notes (Signed)
PROGRESS NOTE    Keith Nguyen.  [id number] DOB: 07-13-1950 DOA: 11/02/2018 PCP: [name], MD   Brief Narrative: 68 year old with past medical history significant for systolic heart failure ejection fraction 35 to 40%, hypertension, CKD stage III, hepatitis C cirrhosis treated with Harvoni COPD tobacco alcohol use quit last month, visiting daughter in [place] presented to the ED on 10/12 with progressive dyspnea associated with nonproductive cough and fluid retention for 3 days.  Evaluation in the ED patient was noted to be in heart failure, CT angio was done and it was negative for PE did reveal moderate emphysema, 4.5 cm ascending aorta,  anasarca and a small left pleural effusion.  Patient was evaluated by cardiology for acute systolic heart failure exacerbation, managed with Lasix drip as well as milrinone drip.  Echo this admission showed lower ejection fraction 20 to 25%.  Initially there was rise in creatinine concerning for cardiorenal syndrome which improved with milrinone drip.  Patient underwent left heart cath/pulmonary function test for evaluation of LVAD.  Plan for LVAD probably the end of the week.  GI has been consulted for evaluation of cirrhosis in anticipation of LVAD.    Assessment & Plan:   Principal Problem:   Acute on chronic congestive heart failure (HCC) Active Problems:   Hyperlipidemia   History of hepatitis C   Essential hypertension   Acute on chronic systolic (congestive) heart failure (HCC)   Protein-calorie malnutrition, severe   DNR no code (do not resuscitate)   Other fatigue   Palliative care by specialist  1-Acute on chronic systolic heart failure with acute hypoxic respiratory failure; Heart failure team following. Continue with milrinone drip. Continue with digoxin, spironolactone metoprolol BiDil. Lasix on hold. Status post right heart cath and left heart cath this admission. Plan for probably LVAD on Monday.  Management per  cardiology.   2-Cardiorenal syndrome with AKI on chronic kidney disease stage III: Creatinine improved and is stable. Lasix and losartan on hold.  3-Hepatitis C liver cirrhosis: Hepatitis C RNA quantitative confirm hepatitis C clearance. Patient to be evaluated by GI today.  On Aldactone. Per GI patient is child A cirrhotic, his risk should be  acceptable.  4-Hypertension: On BiDil, spironolactone.  5-Hyponatremia, improved.  6-Delirium: Resolved thought to be related to medication 7-sleep apnea: Pulse oxygen therapy while sleeping.  Will need home oxygen at discharge 8-severe protein caloric malnutrition: Low albumin.  Seen by nutritionist 9-Constipation; schedule miralax, added senna. Had 2 BM on 10/23.       Nutrition Problem: Severe Malnutrition Etiology: chronic illness(CHF, COPD)    Signs/Symptoms: severe fat depletion, severe muscle depletion, percent weight loss(19.5% weight loss in less than 8 months) Percent weight loss: 19.5 %(less than 8 months)    Interventions: Ensure Enlive (each supplement provides 350kcal and 20 grams of protein), Magic cup, Liberalize Diet  Estimated body mass index is 20.63 kg/m as calculated from the following:   Height as of this encounter: 5\' 11"  (1.803 m).   Weight as of this encounter: 67.1 kg.   DVT prophylaxis: Heparin Code Status: Full code Family Communication: Care discussed with patient Disposition Plan: Remain in the hospital on IV Miradon, he will require LVAD Consultants:   Cardiology  Procedures:  Left and right cath Echo Antimicrobials:    Subjective: Had 2 BM yesterday.  No new complaints.   Objective: Vitals:   11/14/18 1915 11/14/18 2300 11/15/18 0339 11/15/18 1109  BP: 110/83 112/82 118/87 111/78  Pulse: 100 92 96  Resp: 20 17 18  (!) 21  Temp: 97.6 F (36.4 C) 98.4 F (36.9 C) 97.7 F (36.5 C) 98 F (36.7 C)  TempSrc: Oral Oral Oral Oral  SpO2: 99% 93% 97%   Weight:   67.1 kg   Height:         Intake/Output Summary (Last 24 hours) at 11/15/2018 1150 Last data filed at 11/15/2018 0700 Gross per 24 hour  Intake 1071.84 ml  Output 750 ml  Net 321.84 ml   Filed Weights   11/13/18 0407 11/14/18 0350 11/15/18 0339  Weight: 66.7 kg 67.4 kg 67.1 kg    Examination:  General exam: NAD Respiratory system: CTA Cardiovascular system: S 1, S 2 RRR Gastrointestinal system: BS present, soft, nt Central nervous system: Non focal.  Extremities: Symmetric power.  Skin: No rashes   Data Reviewed: I have personally reviewed following labs and imaging studies  CBC: Recent Labs  Lab 10/23/2018 0505 11/01/2018 0818 11/12/18 0458 11/13/18 0251 11/14/18 0413 11/15/18 0339  WBC 5.5  --  4.8 5.5 4.7 4.7  NEUTROABS 2.7  --  2.6 3.0 2.7 2.3  HGB 16.9 17.7*  17.7* 15.7 15.1 14.7 15.1  HCT 47.8 52.0  52.0 45.2 44.9 44.5 45.2  MCV 85.5  --  87.1 86.8 89.4 88.6  PLT 186  --  161 174 167 123XX123   Basic Metabolic Panel: Recent Labs  Lab 10/25/2018 0505 11/13/2018 0818 11/12/18 0458 11/13/18 0251 11/14/18 0413 11/15/18 0339  NA 127* 130*  131* 131* 132* 131* 130*  K 4.9 4.3  4.3 4.6 4.7 4.8 4.9  CL 91*  --  96* 96* 98 97*  CO2 26  --  26 26 23 23   GLUCOSE 99  --  100* 106* 99 105*  BUN 34*  --  30* 28* 22 23  CREATININE 1.38*  --  1.13 1.12 1.11 1.12  CALCIUM 9.8  --  9.3 9.2 9.1 9.6   GFR: Estimated Creatinine Clearance: 59.1 mL/min (by C-G formula based on SCr of 1.12 mg/dL). Liver Function Tests: Recent Labs  Lab 11/09/18 0410 11/10/18 0531 11/15/2018 0505 11/12/18 0458  AST 93* 64* 55* 46*  ALT 50* 41 32 29  ALKPHOS 74 74 64 70  BILITOT 1.3* 1.4* 2.0* 1.8*  PROT 8.2* 8.3* 7.5 7.6  ALBUMIN 3.2* 3.3* 3.1* 2.9*   No results for input(s): LIPASE, AMYLASE in the last 168 hours. No results for input(s): AMMONIA in the last 168 hours. Coagulation Profile: No results for input(s): INR, PROTIME in the last 168 hours. Cardiac Enzymes: No results for input(s): CKTOTAL,  CKMB, CKMBINDEX, TROPONINI in the last 168 hours. BNP (last 3 results) No results for input(s): PROBNP in the last 8760 hours. HbA1C: No results for input(s): HGBA1C in the last 72 hours. CBG: No results for input(s): GLUCAP in the last 168 hours. Lipid Profile: No results for input(s): CHOL, HDL, LDLCALC, TRIG, CHOLHDL, LDLDIRECT in the last 72 hours. Thyroid Function Tests: No results for input(s): TSH, T4TOTAL, FREET4, T3FREE, THYROIDAB in the last 72 hours. Anemia Panel: No results for input(s): VITAMINB12, FOLATE, FERRITIN, TIBC, IRON, RETICCTPCT in the last 72 hours. Sepsis Labs: No results for input(s): PROCALCITON, LATICACIDVEN in the last 168 hours.  Recent Results (from the past 240 hour(s))  MRSA PCR Screening     Status: None   Collection Time: 11/05/18  2:15 PM   Specimen: Nasal Mucosa; Nasopharyngeal  Result Value Ref Range Status   MRSA by PCR NEGATIVE NEGATIVE Final  Comment:        The GeneXpert MRSA Assay (FDA approved for NASAL specimens only), is one component of a comprehensive MRSA colonization surveillance program. It is not intended to diagnose MRSA infection nor to guide or monitor treatment for MRSA infections. Performed at [facility], [place] [address], [place], [place] [address]   Surgical pcr screen     Status: None   Collection Time: 11/10/18  9:55 AM   Specimen: Nasal Mucosa; Nasal Swab  Result Value Ref Range Status   MRSA, PCR NEGATIVE NEGATIVE Final   Staphylococcus aureus NEGATIVE NEGATIVE Final    Comment: (NOTE) The Xpert SA Assay (FDA approved for NASAL specimens in patients 43 years of age and older), is one component of a comprehensive surveillance program. It is not intended to diagnose infection nor to guide or monitor treatment. Performed at [facility], [place] [address], [place], [place] [address]          Radiology Studies: No results found.      Scheduled Meds: . allopurinol  100 mg  Oral Daily  . aspirin EC  81 mg Oral Daily  . Chlorhexidine Gluconate Cloth  6 each Topical Daily  . digoxin  0.125 mg Oral Daily  . [START ON 11/02/2018] epinephrine  0-10 mcg/min Intravenous To OR  . feeding supplement (ENSURE ENLIVE)  237 mL Oral TID WC  . [START ON 11/04/2018] glutaraldehyde   Topical To OR  . heparin  5,000 Units Subcutaneous Q8H  . [START ON 10/27/2018] insulin   Intravenous To OR  . isosorbide-hydrALAZINE  1 tablet Oral TID  . loratadine  10 mg Oral Daily  . [START ON 11/01/2018] magnesium sulfate  40 mEq Other To OR  . mouth rinse  15 mL Mouth Rinse BID  . metoprolol succinate  25 mg Oral Daily  . [START ON 11/01/2018] phenylephrine  0-100 mcg/min Intravenous To OR  . polyethylene glycol  17 g Oral BID  . [START ON 11/20/2018] potassium chloride  80 mEq Other To OR  . senna-docusate  1 tablet Oral BID  . sodium chloride flush  10-40 mL Intracatheter Q12H  . sodium chloride flush  3 mL Intravenous Q12H  . sodium chloride flush  3 mL Intravenous Q12H  . sodium chloride flush  3 mL Intravenous Q12H  . spironolactone  25 mg Oral Daily  . thiamine  100 mg Intravenous Daily  . [START ON 10/24/2018] tranexamic acid  15 mg/kg Intravenous To OR  . [START ON 10/26/2018] tranexamic acid  2 mg/kg Intracatheter To OR  . [START ON 11/11/2018] vancomycin  1,000 mg Other To OR   Continuous Infusions: . sodium chloride    . [START ON 11/07/2018] cefUROXime (ZINACEF)  IV    . [START ON 10/26/2018] cefUROXime (ZINACEF)  IV    . [START ON 11/22/2018] dexmedetomidine    . [START ON 11/19/2018] DOBUTamine    . [START ON 11/07/2018] DOPamine    . [START ON 10/24/2018] fluconazole (DIFLUCAN) IV    . [START ON 10/23/2018] heparin 30,000 units/NS 1000 mL solution for CELLSAVER    . milrinone 0.375 mcg/kg/min (11/14/18 2252)  . [START ON 11/08/2018] milrinone    . [START ON 11/10/2018] nitroGLYCERIN    . [START ON 11/02/2018] norepinephrine (LEVOPHED) Adult infusion    . [START  ON 10/24/2018] rifampin (RIFADIN) IVPB    . [START ON 10/30/2018] tranexamic acid (CYKLOKAPRON) infusion ([facility])    . [START ON 10/25/2018] vancomycin    . [  START ON 11/21/2018] vasopressin (PITRESSIN) infusion - *FOR SHOCK*       LOS: 11 days    Time spent: 35 minutes    Elmarie Shiley, MD [facility] Pager [phone]  If 7PM-7AM, please contact night-coverage [email or web address] Password [email or web address] 11/15/2018, 11:50 AM

## 2018-11-15 NOTE — Progress Notes (Signed)
Patient ID: Keith Gargus., male   DOB: [birth year], 68 y.o.   MRN: [id number]     Advanced Heart Failure Rounding Note  PCP-Cardiologist: No primary care provider on file.   Subjective:    Feels good on milrinone 0.375. Co-ox 69% Weight and renal function stable. CVP 5   Studies:  CT chest with moderate emphysema, 4.5 cm ascending aorta.  CT abdomen with cirrhosis, ?gallstone, subtle 10 x 10 mm lesion head/neck pancreas.  MRI abdomen pancreas protocol: No pancreatic lesion, gallstones in gallbladder without cholecystitis.  Limited echo with Definity was reviewed, I do not see LV thrombus.    PFTs with minimal obstruction.   RHC Procedural Findings (milrinone 0.375): Hemodynamics (mmHg) RA mean 14 RV 46/13 PA 49/20, 31 PCWP mean 16 Oxygen saturations: PA 70% AO 93% Cardiac Output (Fick) 5.88  Cardiac Index (Fick) 3.06 PVR 2.55 PAPI 2.07 CVP/PCWP 0.875 Cardiac Output (Thermo) 3.16 Cardiac Index (Thermo) 1.64  RHC/LHC (11/07/2018):  Coronary Findings  Diagnostic Dominance: Left Left Main  No significant disease.  Left Anterior Descending  Luminal irregularities.  Left Circumflex  Large vessel supplies left PDA. Luminal irregularities.  Right Coronary Artery  Small, nondominant vessel. 30% stenosis proximally.  Intervention  No interventions have been documented. Right Heart  Right Heart Pressures RHC Procedural Findings (milrinone 0.375): Hemodynamics (mmHg) RA mean 1 RV 25/1 PA 25/2 mean 15 PCWP mean 8 LV 108/1 AO 108/73  Oxygen saturations: PA 74% AO 96%  Cardiac Output (Fick) 4.81  Cardiac Index (Fick) 2.63      Objective:   Weight Range: 67.1 kg Body mass index is 20.63 kg/m.   Vital Signs:   Temp:  [97.6 F (36.4 C)-98.7 F (37.1 C)] 98 F (36.7 C) (10/24 1109) Pulse Rate:  [87-100] 96 (10/24 0339) Resp:  [14-21] 18 (10/24 1217) BP: (110-125)/(73-87) 111/78 (10/24 1109) SpO2:  [93 %-99 %] 97 % (10/24 0339) Weight:  [67.1 kg]  67.1 kg (10/24 0339) Last BM Date: 11/15/18  Weight change: Filed Weights   11/13/18 0407 11/14/18 0350 11/15/18 0339  Weight: 66.7 kg 67.4 kg 67.1 kg    Intake/Output:   Intake/Output Summary (Last 24 hours) at 11/15/2018 1333 Last data filed at 11/15/2018 1100 Gross per 24 hour  Intake 1300.44 ml  Output 1050 ml  Net 250.44 ml      Physical Exam  CVP 5 General:  Well appearing. No resp difficulty HEENT: normal Neck: supple. no JVD. Carotids 2+ bilat; no bruits. No lymphadenopathy or thryomegaly appreciated. Cor: PMI nondisplaced. Regular rate & rhythm. No rubs, gallops or murmurs. Lungs: clear Abdomen: soft, nontender, nondistended. No hepatosplenomegaly. No bruits or masses. Good bowel sounds. Extremities: no cyanosis, clubbing, rash, edema Neuro: alert & orientedx3, cranial nerves grossly intact. moves all 4 extremities w/o difficulty. Affect pleasant   Telemetry   NSR 90. Personally reviewed   Labs    CBC Recent Labs    11/14/18 0413 11/15/18 0339  WBC 4.7 4.7  NEUTROABS 2.7 2.3  HGB 14.7 15.1  HCT 44.5 45.2  MCV 89.4 88.6  PLT 167 [id number]   Basic Metabolic Panel Recent Labs    11/14/18 0413 11/15/18 0339  NA 131* 130*  K 4.8 4.9  CL 98 97*  CO2 23 23  GLUCOSE 99 105*  BUN 22 23  CREATININE 1.11 1.12  CALCIUM 9.1 9.6   Liver Function Tests No results for input(s): AST, ALT, ALKPHOS, BILITOT, PROT, ALBUMIN in the last 72 hours. No results for input(s): LIPASE, AMYLASE  in the last 72 hours. Cardiac Enzymes No results for input(s): CKTOTAL, CKMB, CKMBINDEX, TROPONINI in the last 72 hours.  BNP: BNP (last 3 results) Recent Labs    10/30/2018 2019  BNP >4,500.0*    ProBNP (last 3 results) No results for input(s): PROBNP in the last 8760 hours.   D-Dimer No results for input(s): DDIMER in the last 72 hours. Hemoglobin A1C No results for input(s): HGBA1C in the last 72 hours. Fasting Lipid Panel No results for input(s): CHOL, HDL,  LDLCALC, TRIG, CHOLHDL, LDLDIRECT in the last 72 hours. Thyroid Function Tests No results for input(s): TSH, T4TOTAL, T3FREE, THYROIDAB in the last 72 hours.  Invalid input(s): FREET3  Other results:   Imaging    No results found.   Medications:     Scheduled Medications: . allopurinol  100 mg Oral Daily  . aspirin EC  81 mg Oral Daily  . Chlorhexidine Gluconate Cloth  6 each Topical Daily  . digoxin  0.125 mg Oral Daily  . [START ON 10/31/2018] epinephrine  0-10 mcg/min Intravenous To OR  . feeding supplement (ENSURE ENLIVE)  237 mL Oral TID WC  . [START ON 11/12/2018] glutaraldehyde   Topical To OR  . heparin  5,000 Units Subcutaneous Q8H  . [START ON 10/26/2018] insulin   Intravenous To OR  . isosorbide-hydrALAZINE  1 tablet Oral TID  . loratadine  10 mg Oral Daily  . [START ON 11/12/2018] magnesium sulfate  40 mEq Other To OR  . mouth rinse  15 mL Mouth Rinse BID  . metoprolol succinate  25 mg Oral Daily  . [START ON 10/29/2018] phenylephrine  0-100 mcg/min Intravenous To OR  . polyethylene glycol  17 g Oral BID  . [START ON 10/23/2018] potassium chloride  80 mEq Other To OR  . senna-docusate  1 tablet Oral BID  . sodium chloride flush  10-40 mL Intracatheter Q12H  . sodium chloride flush  3 mL Intravenous Q12H  . sodium chloride flush  3 mL Intravenous Q12H  . sodium chloride flush  3 mL Intravenous Q12H  . spironolactone  25 mg Oral Daily  . thiamine  100 mg Intravenous Daily  . [START ON 11/11/2018] tranexamic acid  15 mg/kg Intravenous To OR  . [START ON 10/26/2018] tranexamic acid  2 mg/kg Intracatheter To OR  . [START ON 10/26/2018] vancomycin  1,000 mg Other To OR    Infusions: . sodium chloride    . [START ON 11/09/2018] cefUROXime (ZINACEF)  IV    . [START ON 10/30/2018] cefUROXime (ZINACEF)  IV    . [START ON 11/15/2018] dexmedetomidine    . [START ON 11/14/2018] DOBUTamine    . [START ON 11/11/2018] DOPamine    . [START ON 11/18/2018] fluconazole  (DIFLUCAN) IV    . [START ON 11/10/2018] heparin 30,000 units/NS 1000 mL solution for CELLSAVER    . milrinone 0.375 mcg/kg/min (11/15/18 0700)  . [START ON 11/16/2018] milrinone    . [START ON 10/28/2018] nitroGLYCERIN    . [START ON 10/31/2018] norepinephrine (LEVOPHED) Adult infusion    . [START ON 11/14/2018] rifampin (RIFADIN) IVPB    . [START ON 11/19/2018] tranexamic acid (CYKLOKAPRON) infusion ([facility])    . [START ON 11/20/2018] vancomycin    . [START ON 11/11/2018] vasopressin (PITRESSIN) infusion - *FOR SHOCK*      PRN Medications: sodium chloride, acetaminophen, albuterol, bisacodyl, bisacodyl, methocarbamol, nitroGLYCERIN, ondansetron (ZOFRAN) IV, ondansetron **OR** [DISCONTINUED] ondansetron (ZOFRAN) IV, sodium chloride, sodium chloride flush, sodium chloride flush  Assessment/Plan   1. Acute on chronic systolic CHF: Nonischemic cardiomyopathy diagnosed in 2017 with echo showing EF 40% and LHC showing mild nonobstructive CAD.  He saw Dr. Wynonia Lawman in the past, but no cardiology evaluation since 2017.  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  He has drunk moderate ETOH in the past (no longer drinks) but does not seem to have drunk enough to cause a cardiomyopathy.  He was markedly volume overloaded on exam initially with biventricular failure and NYHA class IV at admission with rise in creatinine concerning for cardiorenal syndrome. Initial co-ox 40% suggested low CO, milrinone started and increased to 0.375.  He was begun on Lasix gtt at 12 mg/hr and later stopped.  RHC showed R>L heart failure.  Cardiac output looked good on milrinone by Fick but was low by thermodilution (discordant values).  Repeat LHC/RHC showed very low filling pressures and good cardiac index by Fick.  No significant coronary disease noted.  Today, CVP 5 with  co-ox 69%.  - Continue to hold Lasix today, follow CVP. - Continue milrinone 0.375 mcg/kg/min.     - Continue digoxin, level ok recently.   - Continue Bidil 1 tab tid.  - Continue spironolactone 25 mg daily.   - Continue Toprol XL to 25 mg daily with volume overload and low output (also with low HR at times).  - Plan for LVAD on Monday.  CVP remains low, suspect his RV would support LVAD.  However, he also has cirrhosis which will be a consideration.  INR was 1.2 and albumin 3.2 so probably not severe liver dysfunction => GI saw this admit, Ardine Eng A and think he could handle surgery.  PFTs with minimal obstruction despite smoking history.  He is from [facility] but would stay with his daughter in [place]. There was question of pancreatic lesion but pancreatic protocol MRI showed no lesion.  2. AKI on CKD stage 3: Suspect this may be a combination of cardiorenal syndrome and contrast nephropathy (had CTA chest at admission).  Minimal contrast with coronary angiography - creatinine stable at 1.12 today.  3. 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  He was drinking moderate ETOH as well but has quit completely for about 1 month. INR 1.2, albumin 3.2. NH3 was normal. Ardine Eng A per GI and should be stable for surgery.  4. COPD/smoking: He recently quit smoking.  CT chest with moderate emphysema. He is now off oxygen. Minimal obstruction on PFTs.  5. Confusion:  Completely resolved.  Had overnight 10/14-15.  NH3 was normal, doubt hepatic encephalopathy.  No recent ETOH, not due to ETOH withdrawal.  Think may have been due to low output and hospitalization.  CT head unremarkable.  - no confusion today 6. Hyponatremia:  - Na 131-> 130. Continue to fluid restrict, 1500 cc.  7. Aortic insufficiency: ?Moderate range.   Length of Stay: [place], MD  11/15/2018, 1:33 PM  Advanced Heart Failure Team Pager [phone] (M-F; 7a - 4p)  Please contact  [facility] for night-coverage after hours (4p -7a ) and weekends on [email or web address]

## 2018-11-16 ENCOUNTER — Inpatient Hospital Stay (HOSPITAL_COMMUNITY): Payer: 59

## 2018-11-16 ENCOUNTER — Other Ambulatory Visit: Payer: Self-pay

## 2018-11-16 ENCOUNTER — Encounter (HOSPITAL_COMMUNITY): Payer: Self-pay | Admitting: Anesthesiology

## 2018-11-16 DIAGNOSIS — I5023 Acute on chronic systolic (congestive) heart failure: Secondary | ICD-10-CM | POA: Diagnosis not present

## 2018-11-16 LAB — COMPREHENSIVE METABOLIC PANEL
ALT: 31 U/L (ref 0–44)
AST: 54 U/L — ABNORMAL HIGH (ref 15–41)
Albumin: 2.8 g/dL — ABNORMAL LOW (ref 3.5–5.0)
Alkaline Phosphatase: 66 U/L (ref 38–126)
Anion gap: 10 (ref 5–15)
BUN: 29 mg/dL — ABNORMAL HIGH (ref 8–23)
CO2: 22 mmol/L (ref 22–32)
Calcium: 9.3 mg/dL (ref 8.9–10.3)
Chloride: 97 mmol/L — ABNORMAL LOW (ref 98–111)
Creatinine, Ser: 1.2 mg/dL (ref 0.61–1.24)
GFR calc Af Amer: >60 mL/min (ref >60)
GFR calc non Af Amer: >60 mL/min (ref >60)
Glucose, Bld: 122 mg/dL — ABNORMAL HIGH (ref 70–99)
Potassium: 4.6 mmol/L (ref 3.5–5.1)
Sodium: 129 mmol/L — ABNORMAL LOW (ref 135–145)
Total Bilirubin: 0.7 mg/dL (ref 0.3–1.2)
Total Protein: 7.2 g/dL (ref 6.5–8.1)

## 2018-11-16 LAB — BASIC METABOLIC PANEL
Anion gap: 10 (ref 5–15)
BUN: 28 mg/dL — ABNORMAL HIGH (ref 8–23)
CO2: 24 mmol/L (ref 22–32)
Calcium: 9.3 mg/dL (ref 8.9–10.3)
Chloride: 98 mmol/L (ref 98–111)
Creatinine, Ser: 1.16 mg/dL (ref 0.61–1.24)
GFR calc Af Amer: >60 mL/min (ref >60)
GFR calc non Af Amer: >60 mL/min (ref >60)
Glucose, Bld: 101 mg/dL — ABNORMAL HIGH (ref 70–99)
Potassium: 4.9 mmol/L (ref 3.5–5.1)
Sodium: 132 mmol/L — ABNORMAL LOW (ref 135–145)

## 2018-11-16 LAB — CBC WITH DIFFERENTIAL/PLATELET
Abs Immature Granulocytes: 0.02 10*3/uL (ref 0.00–0.07)
Basophils Absolute: 0 10*3/uL (ref 0.0–0.1)
Basophils Relative: 1 %
Eosinophils Absolute: 0.4 10*3/uL (ref 0.0–0.5)
Eosinophils Relative: 8 %
HCT: 43.1 % (ref 39.0–52.0)
Hemoglobin: 14.7 g/dL (ref 13.0–17.0)
Immature Granulocytes: 0 %
Lymphocytes Relative: 30 %
Lymphs Abs: 1.4 10*3/uL (ref 0.7–4.0)
MCH: 30.2 pg (ref 26.0–34.0)
MCHC: 34.1 g/dL (ref 30.0–36.0)
MCV: 88.5 fL (ref 80.0–100.0)
Monocytes Absolute: 0.7 10*3/uL (ref 0.1–1.0)
Monocytes Relative: 16 %
Neutro Abs: 2 10*3/uL (ref 1.7–7.7)
Neutrophils Relative %: 45 %
Platelets: 209 10*3/uL (ref 150–400)
RBC: 4.87 MIL/uL (ref 4.22–5.81)
RDW: 15.9 % — ABNORMAL HIGH (ref 11.5–15.5)
WBC: 4.5 10*3/uL (ref 4.0–10.5)
nRBC: 0 % (ref 0.0–0.2)

## 2018-11-16 LAB — APTT: aPTT: 31 seconds (ref 24–36)

## 2018-11-16 LAB — URINALYSIS, ROUTINE W REFLEX MICROSCOPIC
Bacteria, UA: NONE SEEN
Bilirubin Urine: NEGATIVE
Glucose, UA: NEGATIVE mg/dL
Hgb urine dipstick: NEGATIVE
Ketones, ur: NEGATIVE mg/dL
Nitrite: NEGATIVE
Protein, ur: NEGATIVE mg/dL
Specific Gravity, Urine: 1.008 (ref 1.005–1.030)
pH: 7 (ref 5.0–8.0)

## 2018-11-16 LAB — PROTIME-INR
INR: 1 (ref 0.8–1.2)
Prothrombin Time: 13 seconds (ref 11.4–15.2)

## 2018-11-16 LAB — COOXEMETRY PANEL
Carboxyhemoglobin: 1.1 % (ref 0.5–1.5)
Methemoglobin: 0.8 % (ref 0.0–1.5)
O2 Saturation: 61.1 %
Total hemoglobin: 14.9 g/dL (ref 12.0–16.0)

## 2018-11-16 LAB — HEMOGLOBIN A1C
Hgb A1c MFr Bld: 6.5 % — ABNORMAL HIGH (ref 4.8–5.6)
Mean Plasma Glucose: 139.85 mg/dL

## 2018-11-16 MED ORDER — CHLORHEXIDINE GLUCONATE CLOTH 2 % EX PADS
6.0000 | MEDICATED_PAD | Freq: Once | CUTANEOUS | Status: AC
  Administered 2018-11-17: 6 via TOPICAL

## 2018-11-16 MED ORDER — METOPROLOL TARTRATE 12.5 MG HALF TABLET
12.5000 mg | ORAL_TABLET | Freq: Once | ORAL | Status: AC
  Administered 2018-11-17: 05:00:00 12.5 mg via ORAL
  Filled 2018-11-16: qty 1

## 2018-11-16 MED ORDER — MANNITOL 20 % IV SOLN
INTRAVENOUS | Status: DC
  Filled 2018-11-16: qty 13

## 2018-11-16 MED ORDER — TEMAZEPAM 15 MG PO CAPS: 15.0000 mg | ORAL_CAPSULE | Freq: Once | ORAL | Status: DC | PRN

## 2018-11-16 MED ORDER — BISACODYL 5 MG PO TBEC
5.0000 mg | DELAYED_RELEASE_TABLET | Freq: Once | ORAL | Status: AC
  Administered 2018-11-16: 22:00:00 5 mg via ORAL
  Filled 2018-11-16: qty 1

## 2018-11-16 MED ORDER — PLASMA-LYTE 148 IV SOLN
INTRAVENOUS | Status: DC
  Filled 2018-11-16: qty 2.5

## 2018-11-16 MED ORDER — VANCOMYCIN HCL 1000 MG IV SOLR
INTRAVENOUS | Status: DC
  Filled 2018-11-16: qty 1000

## 2018-11-16 MED ORDER — CHLORHEXIDINE GLUCONATE 0.12 % MT SOLN
15.0000 mL | Freq: Once | OROMUCOSAL | Status: AC
  Administered 2018-11-17: 05:00:00 15 mL via OROMUCOSAL
  Filled 2018-11-16: qty 15

## 2018-11-16 MED ORDER — CHLORHEXIDINE GLUCONATE CLOTH 2 % EX PADS
6.0000 | MEDICATED_PAD | Freq: Once | CUTANEOUS | Status: AC
  Administered 2018-11-16: 22:00:00 6 via TOPICAL

## 2018-11-16 NOTE — Progress Notes (Addendum)
Patient ID: Keith Quiceno., male   DOB: 04/11/1950, 68 y.o.   MRN: [id number]     Advanced Heart Failure Rounding Note  PCP-Cardiologist: No primary care provider on file.   Subjective:    Feels good on milrinone 0.375. No co-ox today. CVP 3   Denies CP or SOB.    Studies:  CT chest with moderate emphysema, 4.5 cm ascending aorta.  CT abdomen with cirrhosis, ?gallstone, subtle 10 x 10 mm lesion head/neck pancreas.  MRI abdomen pancreas protocol: No pancreatic lesion, gallstones in gallbladder without cholecystitis.  Limited echo with Definity was reviewed, I do not see LV thrombus.    PFTs with minimal obstruction.   RHC Procedural Findings (milrinone 0.375): Hemodynamics (mmHg) RA mean 14 RV 46/13 PA 49/20, 31 PCWP mean 16 Oxygen saturations: PA 70% AO 93% Cardiac Output (Fick) 5.88  Cardiac Index (Fick) 3.06 PVR 2.55 PAPI 2.07 CVP/PCWP 0.875 Cardiac Output (Thermo) 3.16 Cardiac Index (Thermo) 1.64  RHC/LHC (10/30/2018):  Coronary Findings  Diagnostic Dominance: Left Left Main  No significant disease.  Left Anterior Descending  Luminal irregularities.  Left Circumflex  Large vessel supplies left PDA. Luminal irregularities.  Right Coronary Artery  Small, nondominant vessel. 30% stenosis proximally.  Intervention  No interventions have been documented. Right Heart  Right Heart Pressures RHC Procedural Findings (milrinone 0.375): Hemodynamics (mmHg) RA mean 1 RV 25/1 PA 25/2 mean 15 PCWP mean 8 LV 108/1 AO 108/73  Oxygen saturations: PA 74% AO 96%  Cardiac Output (Fick) 4.81  Cardiac Index (Fick) 2.63      Objective:   Weight Range: 67.5 kg Body mass index is 20.75 kg/m.   Vital Signs:   Temp:  [97.8 F (36.6 C)-98.3 F (36.8 C)] 98.2 F (36.8 C) (10/25 1117) Pulse Rate:  [96-106] 106 (10/24 2326) Resp:  [13-22] 17 (10/25 1117) BP: (98-121)/(69-89) 99/76 (10/25 1117) SpO2:  [94 %-100 %] 94 % (10/25 0323) Weight:  [67.5 kg] 67.5  kg (10/25 0335) Last BM Date: 11/15/18  Weight change: Filed Weights   11/14/18 0350 11/15/18 0339 11/16/18 0335  Weight: 67.4 kg 67.1 kg 67.5 kg    Intake/Output:   Intake/Output Summary (Last 24 hours) at 11/16/2018 1245 Last data filed at 11/16/2018 1200 Gross per 24 hour  Intake 1454.97 ml  Output 1675 ml  Net -220.03 ml      Physical Exam   General:  Well appearing. No resp difficulty HEENT: normal Neck: supple. no JVD. Carotids 2+ bilat; no bruits. No lymphadenopathy or thryomegaly appreciated. Cor: PMI nondisplaced. Mildly tachy No rubs, gallops or murmurs. Lungs: clear Abdomen: soft, nontender, nondistended. No hepatosplenomegaly. No bruits or masses. Good bowel sounds. Extremities: no cyanosis, clubbing, rash, edema Neuro: alert & orientedx3, cranial nerves grossly intact. moves all 4 extremities w/o difficulty. Affect pleasant  Telemetry   NSR 90-100. Personally reviewed   Labs    CBC Recent Labs    11/15/18 0339 11/16/18 0337  WBC 4.7 4.5  NEUTROABS 2.3 2.0  HGB 15.1 14.7  HCT 45.2 43.1  MCV 88.6 88.5  PLT 185 [id number]   Basic Metabolic Panel Recent Labs    11/15/18 0339 11/16/18 0337  NA 130* 132*  K 4.9 4.9  CL 97* 98  CO2 23 24  GLUCOSE 105* 101*  BUN 23 28*  CREATININE 1.12 1.16  CALCIUM 9.6 9.3   Liver Function Tests No results for input(s): AST, ALT, ALKPHOS, BILITOT, PROT, ALBUMIN in the last 72 hours. No results for input(s): LIPASE, AMYLASE in  the last 72 hours. Cardiac Enzymes No results for input(s): CKTOTAL, CKMB, CKMBINDEX, TROPONINI in the last 72 hours.  BNP: BNP (last 3 results) Recent Labs    11/18/2018 2019  BNP >4,500.0*    ProBNP (last 3 results) No results for input(s): PROBNP in the last 8760 hours.   D-Dimer No results for input(s): DDIMER in the last 72 hours. Hemoglobin A1C No results for input(s): HGBA1C in the last 72 hours. Fasting Lipid Panel No results for input(s): CHOL, HDL, LDLCALC, TRIG,  CHOLHDL, LDLDIRECT in the last 72 hours. Thyroid Function Tests No results for input(s): TSH, T4TOTAL, T3FREE, THYROIDAB in the last 72 hours.  Invalid input(s): FREET3  Other results:   Imaging    No results found.   Medications:     Scheduled Medications: . allopurinol  100 mg Oral Daily  . aspirin EC  81 mg Oral Daily  . Chlorhexidine Gluconate Cloth  6 each Topical Daily  . digoxin  0.125 mg Oral Daily  . [START ON 10/31/2018] epinephrine  0-10 mcg/min Intravenous To OR  . feeding supplement (ENSURE ENLIVE)  237 mL Oral TID WC  . [START ON 11/18/2018] glutaraldehyde   Topical To OR  . [START ON 11/21/2018] heparin-papaverine-plasmalyte irrigation   Irrigation To OR  . heparin  5,000 Units Subcutaneous Q8H  . [START ON 11/12/2018] insulin   Intravenous To OR  . isosorbide-hydrALAZINE  1 tablet Oral TID  . [START ON 11/06/2018] [place] Blood Cardioplegia vial (lidocaine/magnesium/mannitol 0.26g-4g-6.4g)   Intracoronary To OR  . loratadine  10 mg Oral Daily  . [START ON 11/16/2018] magnesium sulfate  40 mEq Other To OR  . mouth rinse  15 mL Mouth Rinse BID  . metoprolol succinate  25 mg Oral Daily  . [START ON 11/16/2018] phenylephrine  0-100 mcg/min Intravenous To OR  . polyethylene glycol  17 g Oral BID  . [START ON 11/13/2018] potassium chloride  80 mEq Other To OR  . senna-docusate  1 tablet Oral BID  . sodium chloride flush  10-40 mL Intracatheter Q12H  . sodium chloride flush  3 mL Intravenous Q12H  . sodium chloride flush  3 mL Intravenous Q12H  . sodium chloride flush  3 mL Intravenous Q12H  . spironolactone  25 mg Oral Daily  . thiamine  100 mg Intravenous Daily  . [START ON 11/06/2018] tranexamic acid  15 mg/kg Intravenous To OR  . [START ON 11/07/2018] tranexamic acid  2 mg/kg Intracatheter To OR  . [START ON 11/04/2018] vancomycin 1000 mg in NS (1000 ml) irrigation for Dr. Roxy Manns case   Irrigation To OR  . [START ON 11/12/2018] vancomycin  1,000 mg Other  To OR    Infusions: . sodium chloride    . [START ON 11/04/2018] cefUROXime (ZINACEF)  IV    . [START ON 10/29/2018] cefUROXime (ZINACEF)  IV    . [START ON 11/03/2018] dexmedetomidine    . [START ON 11/06/2018] DOBUTamine    . [START ON 11/08/2018] DOPamine    . [START ON 11/16/2018] fluconazole (DIFLUCAN) IV    . [START ON 11/06/2018] heparin 30,000 units/NS 1000 mL solution for CELLSAVER    . milrinone 0.375 mcg/kg/min (11/16/18 1200)  . [START ON 11/10/2018] milrinone    . [START ON 11/19/2018] nitroGLYCERIN    . [START ON 11/07/2018] norepinephrine (LEVOPHED) Adult infusion    . [START ON 11/04/2018] rifampin (RIFADIN) IVPB    . [START ON 11/22/2018] tranexamic acid (CYKLOKAPRON) infusion ([facility])    . [START ON  11/07/2018] vancomycin    . [START ON 10/26/2018] vasopressin (PITRESSIN) infusion - *FOR SHOCK*      PRN Medications: sodium chloride, acetaminophen, albuterol, bisacodyl, bisacodyl, methocarbamol, nitroGLYCERIN, ondansetron (ZOFRAN) IV, ondansetron **OR** [DISCONTINUED] ondansetron (ZOFRAN) IV, sodium chloride, sodium chloride flush, sodium chloride flush   Assessment/Plan   1. Acute on chronic systolic CHF: Nonischemic cardiomyopathy diagnosed in 2017 with echo showing EF 40% and LHC showing mild nonobstructive CAD.  He saw Dr. Wynonia Lawman in the past, but no cardiology evaluation since 2017.  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  He has drunk moderate ETOH in the past (no longer drinks) but does not seem to have drunk enough to cause a cardiomyopathy.  He was markedly volume overloaded on exam initially with biventricular failure and NYHA class IV at admission with rise in creatinine concerning for cardiorenal syndrome. Initial co-ox 40% suggested low CO, milrinone started and increased to 0.375.  He was begun on Lasix  gtt at 12 mg/hr and later stopped.  RHC showed R>L heart failure.  Cardiac output looked good on milrinone by Fick but was low by thermodilution (discordant values).  Repeat LHC/RHC showed very low filling pressures and good cardiac index by Fick.  No significant coronary disease noted.  Today, CVP down to 3. No co-ox drawn. Will order.  - Continue to hold Lasix today, follow CVP. - Continue milrinone 0.375 mcg/kg/min.     - Continue digoxin, level ok recently.   - Continue Bidil 1 tab tid.  - Continue spironolactone 25 mg daily.   - Continue Toprol XL to 25 mg daily with volume overload and low output (also with low HR at times).  - Plan for LVAD tomorrow.  CVP remains low, suspect his RV would support LVAD.  2. AKI on CKD stage 3: Suspect this may be a combination of cardiorenal syndrome and contrast nephropathy (had CTA chest at admission).  Minimal contrast with coronary angiography - creatinine stable at 1.16 today.  3. 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  He was drinking moderate ETOH as well but has quit completely for about 1 month. INR 1.2, albumin 3.2. NH3 was normal. Ardine Eng A per GI and should be stable for surgery.  4. COPD/smoking: He recently quit smoking.  CT chest with moderate emphysema. He is now off oxygen. Minimal obstruction on PFTs.  5. Confusion:  Completely resolved.  Had overnight 10/14-15.  NH3 was normal, doubt hepatic encephalopathy.  No recent ETOH, not due to ETOH withdrawal.  Think may have been due to low output and hospitalization.  CT head unremarkable.  - no confusion today 6. Hyponatremia:  - Na 131-> 130 -> 132 Continue to fluid restrict, 1500 cc.  7. Aortic insufficiency: ?Moderate range.   Length of Stay: [place], MD  11/16/2018, 12:45 PM  Advanced Heart Failure Team Pager [phone] (M-F; 7a - 4p)  Please contact [facility] for night-coverage after hours (4p -7a ) and weekends on  [email or web address]

## 2018-11-16 NOTE — Progress Notes (Signed)
PROGRESS NOTE    Keith Nguyen.  [id number] DOB: [birth year] DOA: 11/02/2018 PCP: [name], MD   Brief Narrative: 68 year old with past medical history significant for systolic heart failure ejection fraction 35 to 40%, hypertension, CKD stage III, hepatitis C cirrhosis treated with Harvoni COPD tobacco alcohol use quit last month, visiting daughter in [place] presented to the ED on 10/12 with progressive dyspnea associated with nonproductive cough and fluid retention for 3 days.  Evaluation in the ED patient was noted to be in heart failure, CT angio was done and it was negative for PE did reveal moderate emphysema, 4.5 cm ascending aorta,  anasarca and a small left pleural effusion.  Patient was evaluated by cardiology for acute systolic heart failure exacerbation, managed with Lasix drip as well as milrinone drip.  Echo this admission showed lower ejection fraction 20 to 25%.  Initially there was rise in creatinine concerning for cardiorenal syndrome which improved with milrinone drip.  Patient underwent left heart cath/pulmonary function test for evaluation of LVAD.  Plan for LVAD probably the end of the week.  GI has been consulted for evaluation of cirrhosis in anticipation of LVAD.    Assessment & Plan:   Principal Problem:   Acute on chronic congestive heart failure (HCC) Active Problems:   Hyperlipidemia   History of hepatitis C   Essential hypertension   Acute on chronic systolic (congestive) heart failure (HCC)   Protein-calorie malnutrition, severe   DNR no code (do not resuscitate)   Other fatigue   Palliative care by specialist  1-Acute on chronic systolic heart failure with acute hypoxic respiratory failure; Heart failure team following. Continue with milrinone drip. Continue with digoxin, spironolactone metoprolol BiDil. Lasix on hold. Status post right heart cath and left heart cath this admission. Plan for probably LVAD on Monday.  Management per  cardiology.  Denies dyspnea.   2-Cardiorenal syndrome with AKI on chronic kidney disease stage III: Creatinine improved and is stable. Lasix and losartan on hold.  3-Hepatitis C liver cirrhosis: Hepatitis C RNA quantitative confirm hepatitis C clearance. Patient to be evaluated by GI today.  On Aldactone. Per GI patient is child A cirrhotic, his risk should be  acceptable.  4-Hypertension: On BiDil, spironolactone.  5-Hyponatremia, improved.  6-Delirium: Resolved thought to be related to medication 7-sleep apnea: Pulse oxygen therapy while sleeping.  Will need home oxygen at discharge 8-severe protein caloric malnutrition: Low albumin.  Seen by nutritionist 9-Constipation; schedule miralax, added senna. Had 2 BM on 10/23, 10-24       Nutrition Problem: Severe Malnutrition Etiology: chronic illness(CHF, COPD)    Signs/Symptoms: severe fat depletion, severe muscle depletion, percent weight loss(19.5% weight loss in less than 8 months) Percent weight loss: 19.5 %(less than 8 months)    Interventions: Ensure Enlive (each supplement provides 350kcal and 20 grams of protein), Magic cup, Liberalize Diet  Estimated body mass index is 20.75 kg/m as calculated from the following:   Height as of this encounter: 5\' 11"  (1.803 m).   Weight as of this encounter: 67.5 kg.   DVT prophylaxis: Heparin Code Status: Full code Family Communication: Care discussed with patient Disposition Plan: Remain in the hospital on IV Miradon, he will require LVAD, on Monday Consultants:   Cardiology  Procedures:  Left and right cath Echo Antimicrobials:    Subjective: Feeling well, denies dyspnea. He had BM yesterday again  Objective: Vitals:   11/15/18 2326 11/16/18 0323 11/16/18 0335 11/16/18 0757  BP: 113/69 119/76  98/84  Pulse: (!) 106     Resp: 17 13  17   Temp: 97.9 F (36.6 C) 98.3 F (36.8 C)  98 F (36.7 C)  TempSrc: Oral Oral  Oral  SpO2: 96% 94%    Weight:   67.5 kg    Height:        Intake/Output Summary (Last 24 hours) at 11/16/2018 1056 Last data filed at 11/16/2018 0801 Gross per 24 hour  Intake 1112.59 ml  Output 1675 ml  Net -562.41 ml   Filed Weights   11/14/18 0350 11/15/18 0339 11/16/18 0335  Weight: 67.4 kg 67.1 kg 67.5 kg    Examination:  General exam: NAD Respiratory system: CTA Cardiovascular system: S 1, S 2 RRR Gastrointestinal system: BS present, soft, nt Central nervous system: Non focal.  Extremities: Symmetric power.  Skin: No rashes.    Data Reviewed: I have personally reviewed following labs and imaging studies  CBC: Recent Labs  Lab 11/12/18 0458 11/13/18 0251 11/14/18 0413 11/15/18 0339 11/16/18 0337  WBC 4.8 5.5 4.7 4.7 4.5  NEUTROABS 2.6 3.0 2.7 2.3 2.0  HGB 15.7 15.1 14.7 15.1 14.7  HCT 45.2 44.9 44.5 45.2 43.1  MCV 87.1 86.8 89.4 88.6 88.5  PLT 161 174 167 185 [id number]   Basic Metabolic Panel: Recent Labs  Lab 11/12/18 0458 11/13/18 0251 11/14/18 0413 11/15/18 0339 11/16/18 0337  NA 131* 132* 131* 130* 132*  K 4.6 4.7 4.8 4.9 4.9  CL 96* 96* 98 97* 98  CO2 26 26 23 23 24   GLUCOSE 100* 106* 99 105* 101*  BUN 30* 28* 22 23 28*  CREATININE 1.13 1.12 1.11 1.12 1.16  CALCIUM 9.3 9.2 9.1 9.6 9.3   GFR: Estimated Creatinine Clearance: 57.4 mL/min (by C-G formula based on SCr of 1.16 mg/dL). Liver Function Tests: Recent Labs  Lab 11/10/18 0531 10/29/2018 0505 11/12/18 0458  AST 64* 55* 46*  ALT 41 32 29  ALKPHOS 74 64 70  BILITOT 1.4* 2.0* 1.8*  PROT 8.3* 7.5 7.6  ALBUMIN 3.3* 3.1* 2.9*   No results for input(s): LIPASE, AMYLASE in the last 168 hours. No results for input(s): AMMONIA in the last 168 hours. Coagulation Profile: No results for input(s): INR, PROTIME in the last 168 hours. Cardiac Enzymes: No results for input(s): CKTOTAL, CKMB, CKMBINDEX, TROPONINI in the last 168 hours. BNP (last 3 results) No results for input(s): PROBNP in the last 8760 hours. HbA1C: No results for  input(s): HGBA1C in the last 72 hours. CBG: No results for input(s): GLUCAP in the last 168 hours. Lipid Profile: No results for input(s): CHOL, HDL, LDLCALC, TRIG, CHOLHDL, LDLDIRECT in the last 72 hours. Thyroid Function Tests: No results for input(s): TSH, T4TOTAL, FREET4, T3FREE, THYROIDAB in the last 72 hours. Anemia Panel: No results for input(s): VITAMINB12, FOLATE, FERRITIN, TIBC, IRON, RETICCTPCT in the last 72 hours. Sepsis Labs: No results for input(s): PROCALCITON, LATICACIDVEN in the last 168 hours.  Recent Results (from the past 240 hour(s))  Surgical pcr screen     Status: None   Collection Time: 11/10/18  9:55 AM   Specimen: Nasal Mucosa; Nasal Swab  Result Value Ref Range Status   MRSA, PCR NEGATIVE NEGATIVE Final   Staphylococcus aureus NEGATIVE NEGATIVE Final    Comment: (NOTE) The Xpert SA Assay (FDA approved for NASAL specimens in patients 60 years of age and older), is one component of a comprehensive surveillance program. It is not intended to diagnose infection nor to guide or monitor treatment. Performed at  [facility], [place] [address], [place],  [address]          Radiology Studies: No results found.      Scheduled Meds: . allopurinol  100 mg Oral Daily  . aspirin EC  81 mg Oral Daily  . Chlorhexidine Gluconate Cloth  6 each Topical Daily  . digoxin  0.125 mg Oral Daily  . [START ON 11/06/2018] epinephrine  0-10 mcg/min Intravenous To OR  . feeding supplement (ENSURE ENLIVE)  237 mL Oral TID WC  . [START ON 11/15/2018] glutaraldehyde   Topical To OR  . heparin  5,000 Units Subcutaneous Q8H  . [START ON 11/13/2018] insulin   Intravenous To OR  . isosorbide-hydrALAZINE  1 tablet Oral TID  . loratadine  10 mg Oral Daily  . [START ON 11/07/2018] magnesium sulfate  40 mEq Other To OR  . mouth rinse  15 mL Mouth Rinse BID  . metoprolol succinate  25 mg Oral Daily  . [START ON 11/22/2018] phenylephrine  0-100 mcg/min Intravenous  To OR  . polyethylene glycol  17 g Oral BID  . [START ON 11/15/2018] potassium chloride  80 mEq Other To OR  . senna-docusate  1 tablet Oral BID  . sodium chloride flush  10-40 mL Intracatheter Q12H  . sodium chloride flush  3 mL Intravenous Q12H  . sodium chloride flush  3 mL Intravenous Q12H  . sodium chloride flush  3 mL Intravenous Q12H  . spironolactone  25 mg Oral Daily  . thiamine  100 mg Intravenous Daily  . [START ON 11/01/2018] tranexamic acid  15 mg/kg Intravenous To OR  . [START ON 11/01/2018] tranexamic acid  2 mg/kg Intracatheter To OR  . [START ON 11/02/2018] vancomycin  1,000 mg Other To OR   Continuous Infusions: . sodium chloride    . [START ON 11/13/2018] cefUROXime (ZINACEF)  IV    . [START ON 11/15/2018] cefUROXime (ZINACEF)  IV    . [START ON 11/21/2018] dexmedetomidine    . [START ON 10/27/2018] DOBUTamine    . [START ON 11/11/2018] DOPamine    . [START ON 10/29/2018] fluconazole (DIFLUCAN) IV    . [START ON 11/12/2018] heparin 30,000 units/NS 1000 mL solution for CELLSAVER    . milrinone 0.375 mcg/kg/min (11/16/18 0033)  . [START ON 10/25/2018] milrinone    . [START ON 11/13/2018] nitroGLYCERIN    . [START ON 11/06/2018] norepinephrine (LEVOPHED) Adult infusion    . [START ON 11/02/2018] rifampin (RIFADIN) IVPB    . [START ON 11/05/2018] tranexamic acid (CYKLOKAPRON) infusion ([facility])    . [START ON 11/21/2018] vancomycin    . [START ON 10/25/2018] vasopressin (PITRESSIN) infusion - *FOR SHOCK*       LOS: 12 days    Time spent: 35 minutes    Elmarie Shiley, MD [facility] Pager [phone]  If 7PM-7AM, please contact night-coverage [email or web address] Password [email or web address] 11/16/2018, 10:56 AM

## 2018-11-16 NOTE — Anesthesia Preprocedure Evaluation (Addendum)
Anesthesia Evaluation  Patient identified by MRN, date of birth, ID band Patient awake    Reviewed: Allergy & Precautions, Patient's Chart, lab work & pertinent test results  History of Anesthesia Complications Negative for: history of anesthetic complications  Airway Mallampati: II  TM Distance: >3 FB Neck ROM: Full    Dental  (+) Dental Advisory Given, Teeth Intact   Pulmonary Current Smoker and Patient abstained from smoking.,    breath sounds clear to auscultation       Cardiovascular hypertension, Pt. on medications and Pt. on home beta blockers +CHF   Rhythm:Regular     Neuro/Psych negative neurological ROS     GI/Hepatic negative GI ROS, (+) Hepatitis -, C  Endo/Other  negative endocrine ROS  Renal/GU negative Renal ROS     Musculoskeletal negative musculoskeletal ROS (+)   Abdominal   Peds  Hematology negative hematology ROS (+)   Anesthesia Other Findings   Reproductive/Obstetrics                            Echo:  1. Left ventricular ejection fraction, by visual estimation, is 20 to 25%. The left ventricle has severely decreased function. Mildly increased left ventricular size. There is mildly increased left ventricular hypertrophy. there is global hypokinesis  without regional abnormalities. No left ventricular thrombus is seen. There is slow apical swirling of Definity contrast, consistent with low flow state.  Anesthesia Physical Anesthesia Plan  ASA: IV  Anesthesia Plan: General   Post-op Pain Management:    Induction: Intravenous  PONV Risk Score and Plan: 1 and Ondansetron  Airway Management Planned: Oral ETT  Additional Equipment: Arterial line, CVP, PA Cath, TEE and Ultrasound Guidance Line Placement  Intra-op Plan:   Post-operative Plan: Post-operative intubation/ventilation  Informed Consent:    Discussed DNR with patient and Suspend DNR.     Plan  Discussed with: CRNA  Anesthesia Plan Comments: (DNR suspended perioperatively at request of patient. Wife is decision maker if needed. )       Anesthesia Quick Evaluation

## 2018-11-17 ENCOUNTER — Inpatient Hospital Stay (HOSPITAL_COMMUNITY): Payer: 59

## 2018-11-17 ENCOUNTER — Inpatient Hospital Stay (HOSPITAL_COMMUNITY): Payer: 59 | Admitting: Anesthesiology

## 2018-11-17 ENCOUNTER — Inpatient Hospital Stay (HOSPITAL_COMMUNITY): Admission: EM | Disposition: E | Payer: Self-pay | Source: Home / Self Care | Attending: Cardiothoracic Surgery

## 2018-11-17 DIAGNOSIS — I351 Nonrheumatic aortic (valve) insufficiency: Secondary | ICD-10-CM

## 2018-11-17 DIAGNOSIS — I5022 Chronic systolic (congestive) heart failure: Secondary | ICD-10-CM

## 2018-11-17 DIAGNOSIS — Z95811 Presence of heart assist device: Secondary | ICD-10-CM

## 2018-11-17 HISTORY — PX: INSERTION OF IMPLANTABLE LEFT VENTRICULAR ASSIST DEVICE: SHX5866

## 2018-11-17 HISTORY — PX: TEE WITHOUT CARDIOVERSION: SHX5443

## 2018-11-17 HISTORY — PX: AORTIC VALVE REPAIR: SHX6306

## 2018-11-17 LAB — BASIC METABOLIC PANEL
Anion gap: 9 (ref 5–15)
Anion gap: 6 (ref 5–15)
Anion gap: 7 (ref 5–15)
BUN: 26 mg/dL — ABNORMAL HIGH (ref 8–23)
BUN: 18 mg/dL (ref 8–23)
BUN: 16 mg/dL (ref 8–23)
CO2: 23 mmol/L (ref 22–32)
CO2: 24 mmol/L (ref 22–32)
CO2: 22 mmol/L (ref 22–32)
Calcium: 9.2 mg/dL (ref 8.9–10.3)
Calcium: 7.9 mg/dL — ABNORMAL LOW (ref 8.9–10.3)
Calcium: 8.2 mg/dL — ABNORMAL LOW (ref 8.9–10.3)
Chloride: 100 mmol/L (ref 98–111)
Chloride: 105 mmol/L (ref 98–111)
Chloride: 106 mmol/L (ref 98–111)
Creatinine, Ser: 1.15 mg/dL (ref 0.61–1.24)
Creatinine, Ser: 0.94 mg/dL (ref 0.61–1.24)
Creatinine, Ser: 0.98 mg/dL (ref 0.61–1.24)
GFR calc Af Amer: >60 mL/min (ref >60)
GFR calc Af Amer: >60 mL/min (ref >60)
GFR calc Af Amer: >60 mL/min (ref >60)
GFR calc non Af Amer: >60 mL/min (ref >60)
GFR calc non Af Amer: >60 mL/min (ref >60)
GFR calc non Af Amer: >60 mL/min (ref >60)
Glucose, Bld: 79 mg/dL (ref 70–99)
Glucose, Bld: 109 mg/dL — ABNORMAL HIGH (ref 70–99)
Glucose, Bld: 134 mg/dL — ABNORMAL HIGH (ref 70–99)
Potassium: 4.8 mmol/L (ref 3.5–5.1)
Potassium: 4.7 mmol/L (ref 3.5–5.1)
Potassium: 5.2 mmol/L — ABNORMAL HIGH (ref 3.5–5.1)
Sodium: 132 mmol/L — ABNORMAL LOW (ref 135–145)
Sodium: 135 mmol/L (ref 135–145)
Sodium: 135 mmol/L (ref 135–145)

## 2018-11-17 LAB — POCT I-STAT, CHEM 8
BUN: 25 mg/dL — ABNORMAL HIGH (ref 8–23)
BUN: 24 mg/dL — ABNORMAL HIGH (ref 8–23)
BUN: 24 mg/dL — ABNORMAL HIGH (ref 8–23)
BUN: 23 mg/dL (ref 8–23)
BUN: 23 mg/dL (ref 8–23)
Calcium, Ion: 1.32 mmol/L (ref 1.15–1.40)
Calcium, Ion: 1.24 mmol/L (ref 1.15–1.40)
Calcium, Ion: 1.02 mmol/L — ABNORMAL LOW (ref 1.15–1.40)
Calcium, Ion: 0.99 mmol/L — ABNORMAL LOW (ref 1.15–1.40)
Calcium, Ion: 1.26 mmol/L (ref 1.15–1.40)
Chloride: 101 mmol/L (ref 98–111)
Chloride: 104 mmol/L (ref 98–111)
Chloride: 100 mmol/L (ref 98–111)
Chloride: 100 mmol/L (ref 98–111)
Chloride: 102 mmol/L (ref 98–111)
Creatinine, Ser: 0.9 mg/dL (ref 0.61–1.24)
Creatinine, Ser: 1 mg/dL (ref 0.61–1.24)
Creatinine, Ser: 0.8 mg/dL (ref 0.61–1.24)
Creatinine, Ser: 0.8 mg/dL (ref 0.61–1.24)
Creatinine, Ser: 0.8 mg/dL (ref 0.61–1.24)
Glucose, Bld: 93 mg/dL (ref 70–99)
Glucose, Bld: 98 mg/dL (ref 70–99)
Glucose, Bld: 101 mg/dL — ABNORMAL HIGH (ref 70–99)
Glucose, Bld: 117 mg/dL — ABNORMAL HIGH (ref 70–99)
Glucose, Bld: 107 mg/dL — ABNORMAL HIGH (ref 70–99)
HCT: 45 % (ref 39.0–52.0)
HCT: 42 % (ref 39.0–52.0)
HCT: 31 % — ABNORMAL LOW (ref 39.0–52.0)
HCT: 30 % — ABNORMAL LOW (ref 39.0–52.0)
HCT: 29 % — ABNORMAL LOW (ref 39.0–52.0)
Hemoglobin: 15.3 g/dL (ref 13.0–17.0)
Hemoglobin: 14.3 g/dL (ref 13.0–17.0)
Hemoglobin: 10.5 g/dL — ABNORMAL LOW (ref 13.0–17.0)
Hemoglobin: 10.2 g/dL — ABNORMAL LOW (ref 13.0–17.0)
Hemoglobin: 9.9 g/dL — ABNORMAL LOW (ref 13.0–17.0)
Potassium: 5.1 mmol/L (ref 3.5–5.1)
Potassium: 5.1 mmol/L (ref 3.5–5.1)
Potassium: 6.3 mmol/L (ref 3.5–5.1)
Potassium: 6.5 mmol/L (ref 3.5–5.1)
Potassium: 4.8 mmol/L (ref 3.5–5.1)
Sodium: 134 mmol/L — ABNORMAL LOW (ref 135–145)
Sodium: 134 mmol/L — ABNORMAL LOW (ref 135–145)
Sodium: 133 mmol/L — ABNORMAL LOW (ref 135–145)
Sodium: 133 mmol/L — ABNORMAL LOW (ref 135–145)
Sodium: 136 mmol/L (ref 135–145)
TCO2: 23 mmol/L (ref 22–32)
TCO2: 21 mmol/L — ABNORMAL LOW (ref 22–32)
TCO2: 24 mmol/L (ref 22–32)
TCO2: 24 mmol/L (ref 22–32)
TCO2: 22 mmol/L (ref 22–32)

## 2018-11-17 LAB — PROTIME-INR
INR: 1.3 — ABNORMAL HIGH (ref 0.8–1.2)
Prothrombin Time: 16.4 seconds — ABNORMAL HIGH (ref 11.4–15.2)

## 2018-11-17 LAB — FIBRINOGEN: Fibrinogen: 264 mg/dL (ref 210–475)

## 2018-11-17 LAB — POCT I-STAT 7, (LYTES, BLD GAS, ICA,H+H)
Acid-base deficit: 3 mmol/L — ABNORMAL HIGH (ref 0.0–2.0)
Acid-base deficit: 3 mmol/L — ABNORMAL HIGH (ref 0.0–2.0)
Acid-base deficit: 2 mmol/L (ref 0.0–2.0)
Acid-base deficit: 2 mmol/L (ref 0.0–2.0)
Acid-base deficit: 1 mmol/L (ref 0.0–2.0)
Bicarbonate: 22.7 mmol/L (ref 20.0–28.0)
Bicarbonate: 23.6 mmol/L (ref 20.0–28.0)
Bicarbonate: 25.6 mmol/L (ref 20.0–28.0)
Bicarbonate: 23.6 mmol/L (ref 20.0–28.0)
Bicarbonate: 24.4 mmol/L (ref 20.0–28.0)
Calcium, Ion: 1.06 mmol/L — ABNORMAL LOW (ref 1.15–1.40)
Calcium, Ion: 1.2 mmol/L (ref 1.15–1.40)
Calcium, Ion: 1.18 mmol/L (ref 1.15–1.40)
Calcium, Ion: 1.14 mmol/L — ABNORMAL LOW (ref 1.15–1.40)
Calcium, Ion: 1.18 mmol/L (ref 1.15–1.40)
HCT: 35 % — ABNORMAL LOW (ref 39.0–52.0)
HCT: 28 % — ABNORMAL LOW (ref 39.0–52.0)
HCT: 30 % — ABNORMAL LOW (ref 39.0–52.0)
HCT: 24 % — ABNORMAL LOW (ref 39.0–52.0)
HCT: 26 % — ABNORMAL LOW (ref 39.0–52.0)
Hemoglobin: 11.9 g/dL — ABNORMAL LOW (ref 13.0–17.0)
Hemoglobin: 9.5 g/dL — ABNORMAL LOW (ref 13.0–17.0)
Hemoglobin: 10.2 g/dL — ABNORMAL LOW (ref 13.0–17.0)
Hemoglobin: 8.2 g/dL — ABNORMAL LOW (ref 13.0–17.0)
Hemoglobin: 8.8 g/dL — ABNORMAL LOW (ref 13.0–17.0)
O2 Saturation: 100 %
O2 Saturation: 69 %
O2 Saturation: 93 %
O2 Saturation: 98 %
O2 Saturation: 99 %
Patient temperature: 36.5
Patient temperature: 36.1
Patient temperature: 35.9
Patient temperature: 37.4
Potassium: 4.5 mmol/L (ref 3.5–5.1)
Potassium: 4.5 mmol/L (ref 3.5–5.1)
Potassium: 4.7 mmol/L (ref 3.5–5.1)
Potassium: 4.6 mmol/L (ref 3.5–5.1)
Potassium: 5.2 mmol/L — ABNORMAL HIGH (ref 3.5–5.1)
Sodium: 134 mmol/L — ABNORMAL LOW (ref 135–145)
Sodium: 138 mmol/L (ref 135–145)
Sodium: 138 mmol/L (ref 135–145)
Sodium: 138 mmol/L (ref 135–145)
Sodium: 136 mmol/L (ref 135–145)
TCO2: 24 mmol/L (ref 22–32)
TCO2: 25 mmol/L (ref 22–32)
TCO2: 27 mmol/L (ref 22–32)
TCO2: 25 mmol/L (ref 22–32)
TCO2: 26 mmol/L (ref 22–32)
pCO2 arterial: 41.1 mmHg (ref 32.0–48.0)
pCO2 arterial: 50.4 mmHg — ABNORMAL HIGH (ref 32.0–48.0)
pCO2 arterial: 52.3 mmHg — ABNORMAL HIGH (ref 32.0–48.0)
pCO2 arterial: 40.1 mmHg (ref 32.0–48.0)
pCO2 arterial: 41.7 mmHg (ref 32.0–48.0)
pH, Arterial: 7.351 (ref 7.350–7.450)
pH, Arterial: 7.276 — ABNORMAL LOW (ref 7.350–7.450)
pH, Arterial: 7.294 — ABNORMAL LOW (ref 7.350–7.450)
pH, Arterial: 7.373 (ref 7.350–7.450)
pH, Arterial: 7.377 (ref 7.350–7.450)
pO2, Arterial: 323 mmHg — ABNORMAL HIGH (ref 83.0–108.0)
pO2, Arterial: 40 mmHg — CL (ref 83.0–108.0)
pO2, Arterial: 74 mmHg — ABNORMAL LOW (ref 83.0–108.0)
pO2, Arterial: 104 mmHg (ref 83.0–108.0)
pO2, Arterial: 156 mmHg — ABNORMAL HIGH (ref 83.0–108.0)

## 2018-11-17 LAB — CBC
HCT: 42.9 % (ref 39.0–52.0)
HCT: 29.4 % — ABNORMAL LOW (ref 39.0–52.0)
HCT: 26.5 % — ABNORMAL LOW (ref 39.0–52.0)
Hemoglobin: 14.3 g/dL (ref 13.0–17.0)
Hemoglobin: 9.9 g/dL — ABNORMAL LOW (ref 13.0–17.0)
Hemoglobin: 8.7 g/dL — ABNORMAL LOW (ref 13.0–17.0)
MCH: 29.9 pg (ref 26.0–34.0)
MCH: 30.6 pg (ref 26.0–34.0)
MCH: 29.9 pg (ref 26.0–34.0)
MCHC: 33.3 g/dL (ref 30.0–36.0)
MCHC: 33.7 g/dL (ref 30.0–36.0)
MCHC: 32.8 g/dL (ref 30.0–36.0)
MCV: 89.6 fL (ref 80.0–100.0)
MCV: 90.7 fL (ref 80.0–100.0)
MCV: 91.1 fL (ref 80.0–100.0)
Platelets: 183 10*3/uL (ref 150–400)
Platelets: 130 10*3/uL — ABNORMAL LOW (ref 150–400)
Platelets: 135 10*3/uL — ABNORMAL LOW (ref 150–400)
RBC: 4.79 MIL/uL (ref 4.22–5.81)
RBC: 3.24 MIL/uL — ABNORMAL LOW (ref 4.22–5.81)
RBC: 2.91 MIL/uL — ABNORMAL LOW (ref 4.22–5.81)
RDW: 16.2 % — ABNORMAL HIGH (ref 11.5–15.5)
RDW: 15.9 % — ABNORMAL HIGH (ref 11.5–15.5)
RDW: 16.1 % — ABNORMAL HIGH (ref 11.5–15.5)
WBC: 4.4 10*3/uL (ref 4.0–10.5)
WBC: 12.6 10*3/uL — ABNORMAL HIGH (ref 4.0–10.5)
WBC: 8.3 10*3/uL (ref 4.0–10.5)
nRBC: 0 % (ref 0.0–0.2)
nRBC: 0 % (ref 0.0–0.2)
nRBC: 0 % (ref 0.0–0.2)

## 2018-11-17 LAB — GLUCOSE, CAPILLARY
Glucose-Capillary: 107 mg/dL — ABNORMAL HIGH (ref 70–99)
Glucose-Capillary: 111 mg/dL — ABNORMAL HIGH (ref 70–99)
Glucose-Capillary: 126 mg/dL — ABNORMAL HIGH (ref 70–99)
Glucose-Capillary: 139 mg/dL — ABNORMAL HIGH (ref 70–99)
Glucose-Capillary: 127 mg/dL — ABNORMAL HIGH (ref 70–99)
Glucose-Capillary: 127 mg/dL — ABNORMAL HIGH (ref 70–99)
Glucose-Capillary: 152 mg/dL — ABNORMAL HIGH (ref 70–99)
Glucose-Capillary: 134 mg/dL — ABNORMAL HIGH (ref 70–99)
Glucose-Capillary: 134 mg/dL — ABNORMAL HIGH (ref 70–99)

## 2018-11-17 LAB — MAGNESIUM
Magnesium: 2.2 mg/dL (ref 1.7–2.4)
Magnesium: 2.8 mg/dL — ABNORMAL HIGH (ref 1.7–2.4)

## 2018-11-17 LAB — DIC (DISSEMINATED INTRAVASCULAR COAGULATION)PANEL
D-Dimer, Quant: 1.87 ug/mL-FEU — ABNORMAL HIGH (ref 0.00–0.50)
Fibrinogen: 230 mg/dL (ref 210–475)
INR: 1.4 — ABNORMAL HIGH (ref 0.8–1.2)
Platelets: 123 10*3/uL — ABNORMAL LOW (ref 150–400)
Prothrombin Time: 16.6 seconds — ABNORMAL HIGH (ref 11.4–15.2)
Smear Review: NONE SEEN
aPTT: 45 seconds — ABNORMAL HIGH (ref 24–36)

## 2018-11-17 LAB — ECHO INTRAOPERATIVE TEE
Height: 71 in
Weight: 2384.5 oz

## 2018-11-17 LAB — COOXEMETRY PANEL
Carboxyhemoglobin: 1.6 % — ABNORMAL HIGH (ref 0.5–1.5)
Methemoglobin: 2 % — ABNORMAL HIGH (ref 0.0–1.5)
O2 Saturation: 65.5 %
Total hemoglobin: 8.8 g/dL — ABNORMAL LOW (ref 12.0–16.0)

## 2018-11-17 LAB — PREPARE RBC (CROSSMATCH)

## 2018-11-17 LAB — HEMOGLOBIN AND HEMATOCRIT, BLOOD
HCT: 28.9 % — ABNORMAL LOW (ref 39.0–52.0)
Hemoglobin: 9.7 g/dL — ABNORMAL LOW (ref 13.0–17.0)

## 2018-11-17 LAB — APTT: aPTT: 40 seconds — ABNORMAL HIGH (ref 24–36)

## 2018-11-17 LAB — PLATELET COUNT: Platelets: 92 10*3/uL — ABNORMAL LOW (ref 150–400)

## 2018-11-17 SURGERY — INSERTION OF IMPLANTABLE LEFT VENTRICULAR ASSIST DEVICE
Anesthesia: General | Site: Chest

## 2018-11-17 MED ORDER — SODIUM CHLORIDE 0.9 % IV SOLN
1.5000 g | Freq: Two times a day (BID) | INTRAVENOUS | Status: AC
  Administered 2018-11-17 – 2018-11-19 (×4): 1.5 g via INTRAVENOUS
  Filled 2018-11-17 (×4): qty 1.5

## 2018-11-17 MED ORDER — NITROGLYCERIN IN D5W 200-5 MCG/ML-% IV SOLN: 0.0000 ug/min | INTRAVENOUS | Status: DC

## 2018-11-17 MED ORDER — MAGNESIUM SULFATE 4 GM/100ML IV SOLN
4.0000 g | Freq: Once | INTRAVENOUS | Status: AC
  Administered 2018-11-17: 14:00:00 4 g via INTRAVENOUS
  Filled 2018-11-17: qty 100

## 2018-11-17 MED ORDER — CHLORHEXIDINE GLUCONATE 0.12% ORAL RINSE (MEDLINE KIT)
15.0000 mL | Freq: Two times a day (BID) | OROMUCOSAL | Status: DC
  Administered 2018-11-17 – 2018-11-23 (×9): 15 mL via OROMUCOSAL

## 2018-11-17 MED ORDER — MORPHINE SULFATE (PF) 2 MG/ML IV SOLN
1.0000 mg | INTRAVENOUS | Status: DC | PRN
  Administered 2018-11-17 – 2018-11-18 (×5): 2 mg via INTRAVENOUS
  Administered 2018-11-18: 4 mg via INTRAVENOUS
  Administered 2018-11-19: 2 mg via INTRAVENOUS
  Filled 2018-11-17: qty 2
  Filled 2018-11-17: qty 1
  Filled 2018-11-17: qty 2
  Filled 2018-11-17: qty 1
  Filled 2018-11-17: qty 2
  Filled 2018-11-17 (×2): qty 1

## 2018-11-17 MED ORDER — SODIUM CHLORIDE 0.9% FLUSH: 3.0000 mL | Freq: Two times a day (BID) | INTRAVENOUS | Status: DC

## 2018-11-17 MED ORDER — FLUCONAZOLE IN SODIUM CHLORIDE 400-0.9 MG/200ML-% IV SOLN
400.0000 mg | Freq: Once | INTRAVENOUS | Status: AC
  Administered 2018-11-18: 05:00:00 400 mg via INTRAVENOUS
  Filled 2018-11-17: qty 200

## 2018-11-17 MED ORDER — CHLORHEXIDINE GLUCONATE 0.12 % MT SOLN
15.0000 mL | OROMUCOSAL | Status: AC
  Administered 2018-11-17: 14:00:00 15 mL via OROMUCOSAL

## 2018-11-17 MED ORDER — PLASMA-LYTE 148 IV SOLN
INTRAVENOUS | Status: DC | PRN
  Administered 2018-11-17: 500 mL via INTRAVASCULAR

## 2018-11-17 MED ORDER — VANCOMYCIN HCL 1000 MG IV SOLR
INTRAVENOUS | Status: AC
  Filled 2018-11-17: qty 3000

## 2018-11-17 MED ORDER — PROTAMINE SULFATE 10 MG/ML IV SOLN
INTRAVENOUS | Status: DC | PRN
  Administered 2018-11-17: 200 mg via INTRAVENOUS

## 2018-11-17 MED ORDER — BISACODYL 5 MG PO TBEC
10.0000 mg | DELAYED_RELEASE_TABLET | Freq: Every day | ORAL | Status: DC
  Administered 2018-11-18 – 2018-11-23 (×6): 10 mg via ORAL
  Filled 2018-11-17 (×7): qty 2

## 2018-11-17 MED ORDER — FENTANYL CITRATE (PF) 250 MCG/5ML IJ SOLN
INTRAMUSCULAR | Status: AC
  Filled 2018-11-17: qty 5

## 2018-11-17 MED ORDER — SODIUM CHLORIDE 0.9 % IV SOLN
INTRAVENOUS | Status: AC | PRN
  Administered 2018-11-17: 1000 mL

## 2018-11-17 MED ORDER — ASPIRIN 300 MG RE SUPP: 300.0000 mg | Freq: Every day | RECTAL | Status: DC

## 2018-11-17 MED ORDER — EPINEPHRINE HCL 5 MG/250ML IV SOLN IN NS
0.0000 ug/min | INTRAVENOUS | Status: DC
  Administered 2018-11-18: 4.5 ug/min via INTRAVENOUS
  Filled 2018-11-17: qty 250

## 2018-11-17 MED ORDER — LIDOCAINE 2% (20 MG/ML) 5 ML SYRINGE
INTRAMUSCULAR | Status: AC
  Filled 2018-11-17: qty 5

## 2018-11-17 MED ORDER — VANCOMYCIN HCL IN DEXTROSE 750-5 MG/150ML-% IV SOLN
750.0000 mg | Freq: Two times a day (BID) | INTRAVENOUS | Status: AC
  Administered 2018-11-17 – 2018-11-19 (×4): 750 mg via INTRAVENOUS
  Filled 2018-11-17 (×4): qty 150

## 2018-11-17 MED ORDER — LACTATED RINGERS IV SOLN
INTRAVENOUS | Status: DC
  Administered 2018-11-17 – 2018-11-18 (×2): via INTRAVENOUS

## 2018-11-17 MED ORDER — FENTANYL CITRATE (PF) 250 MCG/5ML IJ SOLN
INTRAMUSCULAR | Status: AC
  Filled 2018-11-17: qty 15

## 2018-11-17 MED ORDER — HEPARIN SODIUM (PORCINE) 1000 UNIT/ML IJ SOLN
INTRAMUSCULAR | Status: AC
  Filled 2018-11-17: qty 1

## 2018-11-17 MED ORDER — VASOPRESSIN 20 UNIT/ML IV SOLN
INTRAVENOUS | Status: AC
  Filled 2018-11-17: qty 1

## 2018-11-17 MED ORDER — CALCIUM CHLORIDE 10 % IV SOLN
INTRAVENOUS | Status: DC | PRN
  Administered 2018-11-17 (×2): 100 mg via INTRAVENOUS

## 2018-11-17 MED ORDER — VANCOMYCIN HCL IN DEXTROSE 1-5 GM/200ML-% IV SOLN: 1000.0000 mg | Freq: Two times a day (BID) | INTRAVENOUS | Status: DC

## 2018-11-17 MED ORDER — DOCUSATE SODIUM 100 MG PO CAPS
200.0000 mg | ORAL_CAPSULE | Freq: Every day | ORAL | Status: DC
  Administered 2018-11-18 – 2018-11-23 (×6): 200 mg via ORAL
  Filled 2018-11-17 (×6): qty 2

## 2018-11-17 MED ORDER — LACTATED RINGERS IV SOLN
500.0000 mL | Freq: Once | INTRAVENOUS | Status: AC | PRN
  Administered 2018-11-17: 900 mL via INTRAVENOUS

## 2018-11-17 MED ORDER — PANTOPRAZOLE SODIUM 40 MG PO TBEC
40.0000 mg | DELAYED_RELEASE_TABLET | Freq: Every day | ORAL | Status: DC
  Administered 2018-11-19 – 2018-11-23 (×5): 40 mg via ORAL
  Filled 2018-11-17 (×5): qty 1

## 2018-11-17 MED ORDER — INSULIN REGULAR BOLUS VIA INFUSION
0.0000 [IU] | Freq: Three times a day (TID) | INTRAVENOUS | Status: DC
  Filled 2018-11-17: qty 10

## 2018-11-17 MED ORDER — PHENYLEPHRINE 40 MCG/ML (10ML) SYRINGE FOR IV PUSH (FOR BLOOD PRESSURE SUPPORT)
PREFILLED_SYRINGE | INTRAVENOUS | Status: AC
  Filled 2018-11-17: qty 10

## 2018-11-17 MED ORDER — ARTIFICIAL TEARS OPHTHALMIC OINT
TOPICAL_OINTMENT | OPHTHALMIC | Status: AC
  Filled 2018-11-17: qty 3.5

## 2018-11-17 MED ORDER — HEPARIN SODIUM (PORCINE) 1000 UNIT/ML IJ SOLN
INTRAMUSCULAR | Status: DC | PRN
  Administered 2018-11-17: 21000 [IU] via INTRAVENOUS

## 2018-11-17 MED ORDER — STERILE WATER FOR IRRIGATION IR SOLN
Status: DC | PRN
  Administered 2018-11-17: 2000 mL

## 2018-11-17 MED ORDER — FENTANYL CITRATE (PF) 250 MCG/5ML IJ SOLN
INTRAMUSCULAR | Status: DC | PRN
  Administered 2018-11-17: 150 ug via INTRAVENOUS
  Administered 2018-11-17: 100 ug via INTRAVENOUS
  Administered 2018-11-17: 150 ug via INTRAVENOUS
  Administered 2018-11-17: 100 ug via INTRAVENOUS
  Administered 2018-11-17 (×4): 250 ug via INTRAVENOUS
  Administered 2018-11-17: 150 ug via INTRAVENOUS
  Administered 2018-11-17 (×2): 50 ug via INTRAVENOUS

## 2018-11-17 MED ORDER — PROTAMINE SULFATE 10 MG/ML IV SOLN
INTRAVENOUS | Status: AC
  Filled 2018-11-17: qty 25

## 2018-11-17 MED ORDER — POTASSIUM CHLORIDE 10 MEQ/50ML IV SOLN
10.0000 meq | INTRAVENOUS | Status: AC
  Filled 2018-11-17: qty 50

## 2018-11-17 MED ORDER — SODIUM CHLORIDE 0.9 % IV SOLN: INTRAVENOUS | Status: DC

## 2018-11-17 MED ORDER — ARTIFICIAL TEARS OPHTHALMIC OINT
TOPICAL_OINTMENT | OPHTHALMIC | Status: DC | PRN
  Administered 2018-11-17: 1 via OPHTHALMIC

## 2018-11-17 MED ORDER — ROCURONIUM BROMIDE 10 MG/ML (PF) SYRINGE
PREFILLED_SYRINGE | INTRAVENOUS | Status: DC | PRN
  Administered 2018-11-17: 20 mg via INTRAVENOUS
  Administered 2018-11-17: 50 mg via INTRAVENOUS
  Administered 2018-11-17: 70 mg via INTRAVENOUS
  Administered 2018-11-17: 30 mg via INTRAVENOUS

## 2018-11-17 MED ORDER — RIFAMPIN 300 MG PO CAPS
600.0000 mg | ORAL_CAPSULE | Freq: Once | ORAL | Status: AC
  Administered 2018-11-18: 600 mg via ORAL
  Filled 2018-11-17: qty 2

## 2018-11-17 MED ORDER — ASPIRIN EC 325 MG PO TBEC
325.0000 mg | DELAYED_RELEASE_TABLET | Freq: Every day | ORAL | Status: DC
  Administered 2018-11-18 – 2018-11-23 (×6): 325 mg via ORAL
  Filled 2018-11-17 (×6): qty 1

## 2018-11-17 MED ORDER — BISACODYL 10 MG RE SUPP: 10.0000 mg | Freq: Every day | RECTAL | Status: DC

## 2018-11-17 MED ORDER — ACETAMINOPHEN 160 MG/5ML PO SOLN
1000.0000 mg | Freq: Four times a day (QID) | ORAL | Status: AC
  Administered 2018-11-17 – 2018-11-18 (×2): 1000 mg
  Filled 2018-11-17 (×2): qty 40.6

## 2018-11-17 MED ORDER — ORAL CARE MOUTH RINSE
15.0000 mL | OROMUCOSAL | Status: DC
  Administered 2018-11-17 – 2018-11-18 (×8): 15 mL via OROMUCOSAL

## 2018-11-17 MED ORDER — SODIUM CHLORIDE 0.45 % IV SOLN: INTRAVENOUS | Status: DC | PRN

## 2018-11-17 MED ORDER — ACETAMINOPHEN 500 MG PO TABS
1000.0000 mg | ORAL_TABLET | Freq: Four times a day (QID) | ORAL | Status: AC
  Administered 2018-11-18 – 2018-11-22 (×18): 1000 mg via ORAL
  Filled 2018-11-17 (×18): qty 2

## 2018-11-17 MED ORDER — LACTATED RINGERS IV SOLN
INTRAVENOUS | Status: DC | PRN
  Administered 2018-11-17 (×2): via INTRAVENOUS

## 2018-11-17 MED ORDER — ACETAMINOPHEN 650 MG RE SUPP: 650.0000 mg | Freq: Once | RECTAL | Status: AC

## 2018-11-17 MED ORDER — HEMOSTATIC AGENTS (NO CHARGE) OPTIME
TOPICAL | Status: DC | PRN
  Administered 2018-11-17 (×3): 1 via TOPICAL

## 2018-11-17 MED ORDER — SODIUM CHLORIDE 0.9% FLUSH: 3.0000 mL | INTRAVENOUS | Status: DC | PRN

## 2018-11-17 MED ORDER — CALCIUM CHLORIDE 10 % IV SOLN
INTRAVENOUS | Status: AC
  Filled 2018-11-17: qty 10

## 2018-11-17 MED ORDER — SODIUM CHLORIDE 0.9 % IV SOLN: 10.0000 mL/h | Freq: Once | INTRAVENOUS | Status: DC

## 2018-11-17 MED ORDER — MIDAZOLAM HCL 2 MG/2ML IJ SOLN
INTRAMUSCULAR | Status: AC
  Filled 2018-11-17: qty 4

## 2018-11-17 MED ORDER — VANCOMYCIN HCL 1000 MG IV SOLR
INTRAVENOUS | Status: DC | PRN
  Administered 2018-11-17: 1000 mL

## 2018-11-17 MED ORDER — SODIUM CHLORIDE 0.9 % IV SOLN: 250.0000 mL | INTRAVENOUS | Status: DC

## 2018-11-17 MED ORDER — FAMOTIDINE IN NACL 20-0.9 MG/50ML-% IV SOLN
20.0000 mg | Freq: Two times a day (BID) | INTRAVENOUS | Status: AC
  Administered 2018-11-17 (×2): 20 mg via INTRAVENOUS
  Filled 2018-11-17 (×2): qty 50

## 2018-11-17 MED ORDER — INSULIN REGULAR(HUMAN) IN NACL 100-0.9 UT/100ML-% IV SOLN: INTRAVENOUS | Status: DC

## 2018-11-17 MED ORDER — ETOMIDATE 2 MG/ML IV SOLN
INTRAVENOUS | Status: DC | PRN
  Administered 2018-11-17: 10 mg via INTRAVENOUS

## 2018-11-17 MED ORDER — DEXMEDETOMIDINE HCL IN NACL 400 MCG/100ML IV SOLN
0.0000 ug/kg/h | INTRAVENOUS | Status: DC
  Administered 2018-11-17: 21:00:00 0.7 ug/kg/h via INTRAVENOUS
  Filled 2018-11-17: qty 200

## 2018-11-17 MED ORDER — ETOMIDATE 2 MG/ML IV SOLN
INTRAVENOUS | Status: AC
  Filled 2018-11-17: qty 10

## 2018-11-17 MED ORDER — ALBUMIN HUMAN 5 % IV SOLN
250.0000 mL | INTRAVENOUS | Status: AC | PRN
  Administered 2018-11-17 (×4): 12.5 g via INTRAVENOUS
  Filled 2018-11-17: qty 500

## 2018-11-17 MED ORDER — DEXMEDETOMIDINE HCL IN NACL 400 MCG/100ML IV SOLN
0.1000 ug/kg/h | INTRAVENOUS | Status: DC
  Filled 2018-11-17 (×2): qty 100

## 2018-11-17 MED ORDER — TRAMADOL HCL 50 MG PO TABS
50.0000 mg | ORAL_TABLET | ORAL | Status: DC | PRN
  Administered 2018-11-18 – 2018-11-20 (×2): 50 mg via ORAL
  Filled 2018-11-17 (×2): qty 1

## 2018-11-17 MED ORDER — EPINEPHRINE PF 1 MG/ML IJ SOLN
0.0000 ug/min | INTRAVENOUS | Status: DC
  Filled 2018-11-17: qty 4

## 2018-11-17 MED ORDER — MIDAZOLAM HCL 2 MG/2ML IJ SOLN
2.0000 mg | INTRAMUSCULAR | Status: DC | PRN
  Administered 2018-11-17 – 2018-11-18 (×4): 2 mg via INTRAVENOUS
  Filled 2018-11-17 (×4): qty 2

## 2018-11-17 MED ORDER — OXYCODONE HCL 5 MG PO TABS
5.0000 mg | ORAL_TABLET | ORAL | Status: DC | PRN
  Administered 2018-11-18 – 2018-11-19 (×2): 5 mg via ORAL
  Administered 2018-11-19 (×2): 10 mg via ORAL
  Filled 2018-11-17 (×2): qty 1
  Filled 2018-11-17 (×2): qty 2

## 2018-11-17 MED ORDER — 0.9 % SODIUM CHLORIDE (POUR BTL) OPTIME
TOPICAL | Status: DC | PRN
  Administered 2018-11-17: 5000 mL
  Administered 2018-11-17: 1000 mL

## 2018-11-17 MED ORDER — VANCOMYCIN HCL 1000 MG IV SOLR
INTRAVENOUS | Status: DC | PRN
  Administered 2018-11-17: 3 g
  Administered 2018-11-17: 1000 mg

## 2018-11-17 MED ORDER — ALBUMIN HUMAN 5 % IV SOLN
INTRAVENOUS | Status: AC
  Administered 2018-11-17: 22:00:00 12.5 g
  Filled 2018-11-17: qty 500

## 2018-11-17 MED ORDER — ASPIRIN 81 MG PO CHEW
324.0000 mg | CHEWABLE_TABLET | Freq: Every day | ORAL | Status: DC
  Filled 2018-11-17: qty 4

## 2018-11-17 MED ORDER — ACETAMINOPHEN 160 MG/5ML PO SOLN
650.0000 mg | Freq: Once | ORAL | Status: AC
  Administered 2018-11-17: 17:00:00 650 mg

## 2018-11-17 MED ORDER — LACTATED RINGERS IV SOLN
INTRAVENOUS | Status: DC | PRN
  Administered 2018-11-17: 07:00:00 via INTRAVENOUS

## 2018-11-17 MED ORDER — ROCURONIUM BROMIDE 10 MG/ML (PF) SYRINGE
PREFILLED_SYRINGE | INTRAVENOUS | Status: AC
  Filled 2018-11-17: qty 10

## 2018-11-17 MED ORDER — MIDAZOLAM HCL 5 MG/5ML IJ SOLN
INTRAMUSCULAR | Status: DC | PRN
  Administered 2018-11-17 (×4): 2 mg via INTRAVENOUS

## 2018-11-17 MED ORDER — MIDAZOLAM HCL 2 MG/2ML IJ SOLN
INTRAMUSCULAR | Status: AC
  Filled 2018-11-17: qty 2

## 2018-11-17 MED ORDER — SODIUM CHLORIDE (PF) 0.9 % IJ SOLN
OROMUCOSAL | Status: DC | PRN
  Administered 2018-11-17 (×3): 4 mL via TOPICAL

## 2018-11-17 MED ORDER — ALBUMIN HUMAN 5 % IV SOLN
INTRAVENOUS | Status: DC | PRN
  Administered 2018-11-17 (×2): via INTRAVENOUS

## 2018-11-17 MED ORDER — ONDANSETRON HCL 4 MG/2ML IJ SOLN: 4.0000 mg | Freq: Four times a day (QID) | INTRAMUSCULAR | Status: DC | PRN

## 2018-11-17 MED ORDER — LACTATED RINGERS IV SOLN: INTRAVENOUS | Status: DC

## 2018-11-17 MED ORDER — NOREPINEPHRINE 4 MG/250ML-% IV SOLN: 0.0000 ug/min | INTRAVENOUS | Status: DC

## 2018-11-17 SURGICAL SUPPLY — 111 items
ADAPTER CARDIO PERF ANTE/RETRO (ADAPTER) ×1 IMPLANT
ADH SKN CLS APL DERMABOND .7 (GAUZE/BANDAGES/DRESSINGS) ×2
ADPR PRFSN 84XANTGRD RTRGD (ADAPTER) ×2
ANTEGRADE CPLG (MISCELLANEOUS) ×1 IMPLANT
APL PRP STRL LF DISP 70% ISPRP (MISCELLANEOUS)
BAG DECANTER FOR FLEXI CONT (MISCELLANEOUS) ×5 IMPLANT
BLADE CLIPPER SURG (BLADE) ×2 IMPLANT
BLADE STERNUM SYSTEM 6 (BLADE) ×3 IMPLANT
BLADE SURG 12 STRL SS (BLADE) ×3 IMPLANT
BLADE SURG 15 STRL LF DISP TIS (BLADE) IMPLANT
BLADE SURG 15 STRL SS (BLADE) ×3
CANISTER SUCT 3000ML PPV (MISCELLANEOUS) ×3 IMPLANT
CANNULA ARTERIAL NVNT 3/8 20FR (MISCELLANEOUS) ×3 IMPLANT
CANNULA SUMP PERICARDIAL (CANNULA) ×3 IMPLANT
CANNULA VENOUS LOW PROF 34X46 (CANNULA) ×3 IMPLANT
CATH FOLEY 2WAY SLVR  5CC 14FR (CATHETERS)
CATH FOLEY 2WAY SLVR 18FR 30CC (CATHETERS) ×1 IMPLANT
CATH FOLEY 2WAY SLVR 5CC 14FR (CATHETERS) ×2 IMPLANT
CATH HEART VENT LEFT (CATHETERS) IMPLANT
CATH HYDRAGLIDE XL THORACIC (CATHETERS) ×3 IMPLANT
CATH ROBINSON RED A/P 18FR (CATHETERS) ×7 IMPLANT
CATH THORACIC 28FR (CATHETERS) IMPLANT
CHLORAPREP W/TINT 26 (MISCELLANEOUS) ×2 IMPLANT
CONN ST 1/4X3/8  BEN (MISCELLANEOUS) ×2
CONN ST 1/4X3/8 BEN (MISCELLANEOUS) IMPLANT
CONT SPEC 4OZ CLIKSEAL STRL BL (MISCELLANEOUS) ×1 IMPLANT
COVER WAND RF STERILE (DRAPES) ×2 IMPLANT
DERMABOND ADVANCED (GAUZE/BANDAGES/DRESSINGS) ×1
DERMABOND ADVANCED .7 DNX12 (GAUZE/BANDAGES/DRESSINGS) IMPLANT
DRAIN CHANNEL 28F RND 3/8 FF (WOUND CARE) ×1 IMPLANT
DRAPE CV SPLIT W-CLR ANES SCRN (DRAPES) ×3 IMPLANT
DRAPE INCISE IOBAN 66X45 STRL (DRAPES) ×6 IMPLANT
DRAPE PERI GROIN 82X75IN TIB (DRAPES) ×3 IMPLANT
DRAPE SLUSH/WARMER DISC (DRAPES) ×3 IMPLANT
DRSG AQUACEL AG ADV 3.5X14 (GAUZE/BANDAGES/DRESSINGS) ×3 IMPLANT
ELECT BLADE 4.0 EZ CLEAN MEGAD (MISCELLANEOUS) ×3
ELECT BLADE 6.5 EXT (BLADE) ×2 IMPLANT
ELECT CAUTERY BLADE 6.4 (BLADE) ×3 IMPLANT
ELECT REM PT RETURN 9FT ADLT (ELECTROSURGICAL) ×6
ELECTRODE BLDE 4.0 EZ CLN MEGD (MISCELLANEOUS) ×2 IMPLANT
ELECTRODE REM PT RTRN 9FT ADLT (ELECTROSURGICAL) ×2 IMPLANT
FELT TEFLON 1X6 (MISCELLANEOUS) ×2 IMPLANT
FELT TEFLON 6X6 (MISCELLANEOUS) ×2 IMPLANT
GAUZE SPONGE 4X4 12PLY STRL (GAUZE/BANDAGES/DRESSINGS) ×5 IMPLANT
GLOVE BIO SURGEON STRL SZ7 (GLOVE) ×3 IMPLANT
GLOVE BIO SURGEON STRL SZ7.5 (GLOVE) ×1 IMPLANT
GLOVE NEODERM STRL 7.5 LF PF (GLOVE) ×6 IMPLANT
GLOVE SURG NEODERM 7.5  LF PF (GLOVE) ×3
GOWN STRL REUS W/ TWL LRG LVL3 (GOWN DISPOSABLE) ×8 IMPLANT
GOWN STRL REUS W/TWL LRG LVL3 (GOWN DISPOSABLE) ×18
HEMOSTAT POWDER SURGIFOAM 1G (HEMOSTASIS) ×11 IMPLANT
HEMOSTAT SNOW SURGICEL 2X4 (HEMOSTASIS) ×1 IMPLANT
HEMOSTAT SURGICEL 2X14 (HEMOSTASIS) IMPLANT
INSERT FOGARTY XLG (MISCELLANEOUS) ×1 IMPLANT
KIT BASIN OR (CUSTOM PROCEDURE TRAY) ×3 IMPLANT
KIT LVAD HEARTMATE 3 W-CNTRL (Prosthesis & Implant Heart) ×1 IMPLANT
KIT SUCTION CATH 14FR (SUCTIONS) ×4 IMPLANT
KIT TURNOVER KIT B (KITS) ×3 IMPLANT
LEAD PACING MYOCARDI (MISCELLANEOUS) IMPLANT
LINE VENT (MISCELLANEOUS) ×2 IMPLANT
MICROMATRIX 1000MG (Tissue) ×3 IMPLANT
NS IRRIG 1000ML POUR BTL (IV SOLUTION) ×16 IMPLANT
PACK OPEN HEART (CUSTOM PROCEDURE TRAY) ×3 IMPLANT
PAD ARMBOARD 7.5X6 YLW CONV (MISCELLANEOUS) ×6 IMPLANT
PAD DEFIB R2 (MISCELLANEOUS) ×3 IMPLANT
POSITIONER HEAD DONUT 9IN (MISCELLANEOUS) ×3 IMPLANT
POWDER SURGICEL 3.0 GRAM (HEMOSTASIS) ×1 IMPLANT
PUNCH AORTIC ROTATE  4.5MM 8IN (MISCELLANEOUS) ×1 IMPLANT
PUNCH AORTIC ROTATE 4.5MM 8IN (MISCELLANEOUS) ×3 IMPLANT
SEALANT SURG COSEAL 8ML (VASCULAR PRODUCTS) ×4 IMPLANT
SET CARDIOPLEGIA MPS 5001102 (MISCELLANEOUS) ×1 IMPLANT
SHEATH AVANTI 11CM 5FR (SHEATH) IMPLANT
SOLUTION PARTIC MCRMTRX 1000MG (Tissue) IMPLANT
SUT ETHIBOND 2 0 SH (SUTURE) ×15
SUT ETHIBOND 2 0 SH 36X2 (SUTURE) ×10 IMPLANT
SUT ETHIBOND NAB MH 2-0 36IN (SUTURE) ×38 IMPLANT
SUT ETHILON 3 0 FSL (SUTURE) ×1 IMPLANT
SUT PROLENE 3 0 RB 1 (SUTURE) IMPLANT
SUT PROLENE 3 0 SH DA (SUTURE) ×5 IMPLANT
SUT PROLENE 4 0 RB 1 (SUTURE) ×69
SUT PROLENE 4 0 SH DA (SUTURE) ×4 IMPLANT
SUT PROLENE 4-0 RB1 .5 CRCL 36 (SUTURE) ×8 IMPLANT
SUT PROLENE 5 0 C 1 36 (SUTURE) ×1 IMPLANT
SUT PROLENE 5 0 C1 (SUTURE) ×2 IMPLANT
SUT SILK  1 MH (SUTURE) ×4
SUT SILK 1 MH (SUTURE) ×8 IMPLANT
SUT SILK 1 TIES 10X30 (SUTURE) ×3 IMPLANT
SUT SILK 2 0 SH CR/8 (SUTURE) ×6 IMPLANT
SUT SILK 3 0SH CR/8 30 (SUTURE) ×1 IMPLANT
SUT STEEL 6MS V (SUTURE) ×5 IMPLANT
SUT STEEL SZ 6 DBL 3X14 BALL (SUTURE) ×3 IMPLANT
SUT TEM PAC WIRE 2 0 SH (SUTURE) IMPLANT
SUT VIC AB 1 CTX 36 (SUTURE) ×9
SUT VIC AB 1 CTX36XBRD ANBCTR (SUTURE) ×4 IMPLANT
SUT VIC AB 2-0 CT1 27 (SUTURE) ×3
SUT VIC AB 2-0 CT1 TAPERPNT 27 (SUTURE) IMPLANT
SUT VIC AB 2-0 CTX 27 (SUTURE) ×6 IMPLANT
SUT VIC AB 3-0 SH 8-18 (SUTURE) ×3 IMPLANT
SUT VIC AB 3-0 X1 27 (SUTURE) ×6 IMPLANT
SYR 50ML LL SCALE MARK (SYRINGE) ×3 IMPLANT
SYSTEM SAHARA CHEST DRAIN ATS (WOUND CARE) ×3 IMPLANT
TAPE CLOTH SURG 4X10 WHT LF (GAUZE/BANDAGES/DRESSINGS) ×1 IMPLANT
TAPE PAPER 2X10 WHT MICROPORE (GAUZE/BANDAGES/DRESSINGS) ×1 IMPLANT
TOWEL GREEN STERILE (TOWEL DISPOSABLE) ×3 IMPLANT
TRAY CATH LUMEN 1 20CM STRL (SET/KITS/TRAYS/PACK) ×3 IMPLANT
TRAY FOLEY SLVR 16FR TEMP STAT (SET/KITS/TRAYS/PACK) ×3 IMPLANT
TUBE CONNECTING 12X1/4 (SUCTIONS) ×3 IMPLANT
UNDERPAD 30X30 (UNDERPADS AND DIAPERS) ×3 IMPLANT
VENT LEFT HEART 12002 (CATHETERS) ×3
WATER STERILE IRR 1000ML POUR (IV SOLUTION) ×6 IMPLANT
YANKAUER SUCT BULB TIP NO VENT (SUCTIONS) ×4 IMPLANT

## 2018-11-17 NOTE — Progress Notes (Signed)
  Echocardiogram Echocardiogram Transesophageal has been performed.  Keith Nguyen 11/04/2018, 8:53 AM

## 2018-11-17 NOTE — Transfer of Care (Signed)
Immediate Anesthesia Transfer of Care Note  Patient: Keith Nguyen.  Procedure(s) Performed: INSERTION OF IMPLANTABLE LEFT VENTRICULAR ASSIST DEVICE (N/A Chest) AORTIC VALVE REPAIR (N/A ) TRANSESOPHAGEAL ECHOCARDIOGRAM (TEE) (N/A )  Patient Location: SICU  Anesthesia Type:General  Level of Consciousness: sedated, unresponsive and Patient remains intubated per anesthesia plan  Airway & Oxygen Therapy: Patient remains intubated per anesthesia plan and Patient placed on Ventilator (see vital sign flow sheet for setting)  Post-op Assessment: Report given to RN and Post -op Vital signs reviewed and stable  Post vital signs: Reviewed and stable  Last Vitals:  Vitals Value Taken Time  BP 104/88 11/02/2018 1340  Temp 36.1 C 11/21/2018 1345  Pulse 167 11/04/2018 1345  Resp 12 11/21/2018 1345  SpO2 90 % 11/04/2018 1345  Vitals shown include unvalidated device data.  Last Pain:  Vitals:   11/15/2018 0440  TempSrc: Axillary  PainSc:          Complications: No apparent anesthesia complications

## 2018-11-17 NOTE — H&P (Signed)
History and Physical Interval Note:  10/26/2018 7:31 AM  Keith Nguyen.  has presented today for surgery, with the diagnosis of ICM.  The various methods of treatment have been discussed with the patient and family. After consideration of risks, benefits and other options for treatment, the patient has consented to  Procedure(s) with comments: INSERTION OF IMPLANTABLE LEFT VENTRICULAR ASSIST DEVICE (N/A) - HM3 AORTIC VALVE REPAIR (N/A) TRANSESOPHAGEAL ECHOCARDIOGRAM (TEE) (N/A) as a surgical intervention.  The patient's history has been reviewed, patient examined, no change in status, stable for surgery.  I have reviewed the patient's chart and labs.  Questions were answered to the patient's satisfaction.     Daisuke Bailey Z Ivonna Kinnick  TCTS

## 2018-11-17 NOTE — Brief Op Note (Signed)
11/15/2018  1:46 PM  PATIENT:  Keith Nguyen.  68 y.o. male  PRE-OPERATIVE DIAGNOSIS:  ICM  POST-OPERATIVE DIAGNOSIS:  ICM CHF  PROCEDURE:  Procedure(s) with comments: INSERTION OF IMPLANTABLE LEFT VENTRICULAR ASSIST DEVICE (N/A) - HM3 AORTIC VALVE REPAIR (N/A) TRANSESOPHAGEAL ECHOCARDIOGRAM (TEE) (N/A)  SURGEON:  Surgeon(s) and Role:    * Wonda Olds, MD - Primary    * Ivin Poot, MD - Assisting  PHYSICIAN ASSISTANT: n/a  ASSISTANTS: staff   ANESTHESIA:   general  EBL:  1050 mL   BLOOD ADMINISTERED:per anes  DRAINS: 3 Chest Tube(s) in the mediastinum and bilateral pleural spaces   LOCAL MEDICATIONS USED:  NONE  SPECIMEN: LV apex  DISPOSITION OF SPECIMEN:  N/A  COUNTS:  YES  TOURNIQUET:  * No tourniquets in log *  DICTATION: .Note written in EPIC  PLAN OF CARE: Admit to inpatient   PATIENT DISPOSITION:  ICU - intubated and critically ill.   Delay start of Pharmacological VTE agent (>24hrs) due to surgical blood loss or risk of bleeding: yes  [name], [place]

## 2018-11-17 NOTE — Anesthesia Procedure Notes (Signed)
Arterial Line Insertion Start/End10/16/2020 6:40 AM, 11/17/2018 6:45 AM Performed by: Renato Shin, CRNA, CRNA  Patient location: Pre-op. Preanesthetic checklist: patient identified, IV checked, site marked, risks and benefits discussed, surgical consent, monitors and equipment checked, pre-op evaluation, timeout performed and anesthesia consent Lidocaine 1% used for infiltration Left, radial was placed Catheter size: 20 G Hand hygiene performed , maximum sterile barriers used  and Seldinger technique used Allen's test indicative of satisfactory collateral circulation Attempts: 1 Procedure performed without using ultrasound guided technique. Following insertion, dressing applied and Biopatch. Post procedure assessment: normal  Patient tolerated the procedure well with no immediate complications.

## 2018-11-17 NOTE — Anesthesia Procedure Notes (Signed)
Central Venous Catheter Insertion Performed by: Oleta Mouse, MD, anesthesiologist Start/End10/13/2020 6:42 AM, 10/27/2018 6:49 AM Patient location: Pre-op. Preanesthetic checklist: patient identified, IV checked, site marked, risks and benefits discussed, surgical consent, monitors and equipment checked, pre-op evaluation, timeout performed and anesthesia consent Position: Trendelenburg Lidocaine 1% used for infiltration and patient sedated Hand hygiene performed  and maximum sterile barriers used  Catheter size: 9 Fr Total catheter length 10. MAC introducer Procedure performed using ultrasound guided technique. Ultrasound Notes:anatomy identified, needle tip was noted to be adjacent to the nerve/plexus identified, no ultrasound evidence of intravascular and/or intraneural injection and image(s) printed for medical record Attempts: 1 Following insertion, line sutured, dressing applied and Biopatch. Post procedure assessment: blood return through all ports, free fluid flow and no air  Patient tolerated the procedure well with no immediate complications.

## 2018-11-17 NOTE — OR Nursing (Signed)
1249 2 heart charge nurse called and given 20 min. ETA.

## 2018-11-17 NOTE — Op Note (Signed)
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 Murvin Natal, MD  Assistant: Ivin Poot, MD  Anesthesia: get  Operative Findings:  Severely reduced left ventricular systolic function  Moderately reduced right ventricular function  Moderate aortic valve regurgitation    BRIEF CLINICAL NOTE AND INDICATIONS FOR SURGERY  68 yo man admitted several days prior with decompensated heart failiure, responsive to IV milrinone. His LV EF was approximately 20% and no treatment options were available for predictable recovery of ventricular function. He is taken to the OR for durable LVAD having been thoroughly assessed preoperatively and felt to be good candidate for the procedure.    DETAILS OF THE OPERATIVE PROCEDURE  Preparation:  The patient is brought to the operating room on the above mentioned date and central monitoring was established by the anesthesia team including placement of Swan-Ganz catheter and radial arterial line. The patient is placed in the supine position on the operating table.  Intravenous antibiotics are administered. General endotracheal anesthesia is induced uneventfully. A Foley catheter is placed.  Baseline transesophageal echocardiogram was performed.  Findings were notable for severe biventricular dysfunction and moderate AI.   The patient's chest, abdomen, both groins, and both lower extremities are prepared and draped in a sterile manner. A time out procedure is performed.   Surgical Approach:  A median sternotomy incision was performed and a pump pocket is created towards the LV apex by dividing muscular diaphragmatic attachments laterally. Driveline tunnelers were  passed from inside the mediastinum inferiorly and then to the left prior to heparinization. The pericardium is opened and pericardial tacking sutures were placed to create a pericardial well. CO2 was blown into the field. Full dose heparin was delivered intravenously. The ascending aorta is normal in appearance. The ascending aorta and the right atrium are cannulated for cardiopulmonary bypass.  Adequate heparinization is verified. Cardiopulmonary bypass was begun and the patient is kept warm; the lungs are continued to inflate at very low tidal volumes to introduce inhaled nitric oxide systemically. A cardioplegia catheter is inserted into the coronary sinus via the right atrium, and a separate catheter is inserted in the aortic root. The aorta is cross-clamped, and the heart is arrested by giving antegrade and retrograde cardioplegia. An aortotomy is then made and the aortic valve is inspected. The annulus appears circumferentially dilated and the leaflets are redundant. The aortic valve orifice is sewn closed by using 2 horizontal mattress sutures of 4-0 prolene at each commissure. The aortotomy is closed in layers. The heart is deaired and the aortic cross clamp is removed.   LVAD Insertion: Moist packs were placed behind the LV to elevate the LV apex into the wound. The LV apical core is created sharply and angled directly towards the mitral apparatus. The edges of the incised LV are inspected for hemostasis. Circumferential sutures of 2-0 Ethibond with pledgets are placed from the epicardium, into the LV, and back into the epicardium. These are then placed through the sewing cuff of the LVAD inflow housing. The sutures are tied down, and the pump is brought onto the field and inspected. The inflow cannula is secured within the housing and the outflow graft is clamped. The driveline is tunneled exteriorly using the pre-placed tunnelers.  The outflow graft is then measured to appropriate  length. A partial  occlusion clamp is placed on the ascending aorta and aortotomy is made anteriorly. The outflow graft is anastomosed to the ascending aorta with running 4-0 prolene. The side-biting clamp is released and the pump is turned on at 4,000 rpm to deair the LVAD. Full pulmonary ventilation is resumed. The patient is weaned from CPB as the LVAD flows are increased to 5400 rpm gradually.    Procedure Completion:   Epicardial pacing wires are fixed to the right ventricular outflow tract and to the right atrial appendage. The patient is rewarmed to 37C temperature. The patient is weaned and disconnected from cardiopulmonary bypass.  The patient's rhythm at separation from bypass was NSR.  The patient was weaned from cardiopulmonary bypass  With moderate inotropic support.  Followup transesophageal echocardiogram performed after separation from bypass revealed no significant changes from the preoperative exam and good pump inflow positioning.   The aortic and venous cannula were removed uneventfully. Protamine was administered to reverse the anticoagulation. The mediastinum and pleural space were inspected for hemostasis and irrigated with saline solution. The mediastinum and bilateral pleural space were drained using fluted chest tubes placed through separate stab incisions inferiorly.  The soft tissues anterior to the aorta were reapproximated loosely. The sternum is closed with double strength sternal wire. The soft tissues anterior to the sternum were closed in multiple layers and the skin is closed with a running subcuticular skin closure.       Disposition:   The patient tolerated the procedure well and is transported to the surgical intensive care in stable condition. There are no intraoperative complications. All sponge instrument and needle counts are verified correct at completion of the operation.  Marrion Finan Z. Orvan Seen, [place]

## 2018-11-17 NOTE — Progress Notes (Signed)
Per Dr. Orvan Seen, [place] to wean nitric oxide per protocol.  Once nitric is weaned, OK to wean ventilator to extubate if patient is stable.

## 2018-11-17 NOTE — Anesthesia Procedure Notes (Signed)
Procedure Name: Intubation Date/Time: 10/28/2018 8:03 AM Performed by: Renato Shin, CRNA Pre-anesthesia Checklist: Patient identified, Emergency Drugs available, Suction available and Patient being monitored Patient Re-evaluated:Patient Re-evaluated prior to induction Oxygen Delivery Method: Circle system utilized Preoxygenation: Pre-oxygenation with 100% oxygen Induction Type: IV induction Ventilation: Mask ventilation without difficulty Laryngoscope Size: Miller and 3 Grade View: Grade II Tube type: Oral Tube size: 8.0 mm Number of attempts: 1 Airway Equipment and Method: Stylet and Oral airway Placement Confirmation: ETT inserted through vocal cords under direct vision,  positive ETCO2 and breath sounds checked- equal and bilateral Secured at: 21 cm Tube secured with: Tape Dental Injury: Teeth and Oropharynx as per pre-operative assessment

## 2018-11-17 NOTE — Progress Notes (Signed)
EVENING ROUNDS NOTE :     [address]       [place] [address]             [phone]                 Day of Surgery Procedure(s) (LRB): INSERTION OF IMPLANTABLE LEFT VENTRICULAR ASSIST DEVICE (N/A) AORTIC VALVE REPAIR (N/A) TRANSESOPHAGEAL ECHOCARDIOGRAM (TEE) (N/A)   Total Length of Stay:  LOS: 13 days  Events:  Doing well Good hemodynamics makind urine Minimal drainage    BP (!) 93/55   Pulse (!) 185   Temp (!) 97.3 F (36.3 C)   Resp 18   Ht 5\' 11"  (1.803 m)   Wt 67.6 kg   SpO2 94%   BMI 20.79 kg/m   PAP: (7-37)/(-9-21) 33/17 CVP:  [2 mmHg-7 mmHg] 6 mmHg CO:  [5.5 L/min-7 L/min] 7 L/min CI:  [3 L/min/m2-3.8 L/min/m2] 3.8 L/min/m2  Vent Mode: SIMV;PSV;PRVC FiO2 (%):  [50 %] 50 % Set Rate:  [12 bmp-16 bmp] 16 bmp Vt Set:  [600 mL] 600 mL PEEP:  [5 cmH20-8 cmH20] 8 cmH20 Pressure Support:  [10 cmH20] 10 cmH20 Plateau Pressure:  [16 cmH20-20 cmH20] 20 cmH20  . sodium chloride 20 mL/hr at 10/29/2018 1600  . sodium chloride    . sodium chloride    . [START ON 11/18/2018] sodium chloride    . sodium chloride    . cefUROXime (ZINACEF)  IV    . dexmedetomidine (PRECEDEX) IV infusion 0.7 mcg/kg/hr (11/16/2018 1600)  . epinephrine 3 mcg/min (11/10/2018 1600)  . famotidine (PEPCID) IV Stopped (10/25/2018 1454)  . [START ON 11/18/2018] fluconazole (DIFLUCAN) IV    . insulin 0.5 Units/hr (11/12/2018 1330)  . lactated ringers 100 mL/hr at 11/10/2018 1612  . lactated ringers Stopped (11/22/2018 1610)  . magnesium sulfate 20 mL/hr at 11/07/2018 1600  . milrinone 0.375 mcg/kg/min (10/28/2018 1600)  . nitroGLYCERIN 25 mcg/min (10/27/2018 1600)  . norepinephrine (LEVOPHED) Adult infusion Stopped (11/07/2018 1330)  . potassium chloride    . vancomycin      I/O last 3 completed shifts: In: 1382.9 [P.O.:1194; I.V.:188.9] Out: 2075 [Urine:2075]   CBC Latest Ref Rng & Units 11/16/2018 11/08/2018 11/22/2018  WBC 4.0 - 10.5 K/uL 12.6(H) - -  Hemoglobin 13.0 - 17.0  g/dL 9.9(L) 9.5(L) 9.9(L)  Hematocrit 39.0 - 52.0 % 29.4(L) 28.0(L) 29.0(L)  Platelets 150 - 400 K/uL 130(L) - -    BMP Latest Ref Rng & Units 11/03/2018 11/08/2018 11/20/2018  Glucose 70 - 99 mg/dL 109(H) - 107(H)  BUN 8 - 23 mg/dL 18 - 23  Creatinine 0.61 - 1.24 mg/dL 0.94 - 0.80  Sodium 135 - 145 mmol/L 135 138 136  Potassium 3.5 - 5.1 mmol/L 4.7 4.5 4.8  Chloride 98 - 111 mmol/L 105 - 102  CO2 22 - 32 mmol/L 24 - -  Calcium 8.9 - 10.3 mg/dL 7.9(L) - -    ABG    Component Value Date/Time   PHART 7.276 (L) 10/27/2018 1213   PCO2ART 50.4 (H) 10/24/2018 1213   PO2ART 40.0 (LL) 11/22/2018 1213   HCO3 23.6 11/06/2018 1213   TCO2 25 11/16/2018 1213   ACIDBASEDEF 3.0 (H) 10/23/2018 1213   O2SAT 65.5 11/19/2018 [place], MD 11/12/2018 5:10 PM

## 2018-11-17 NOTE — Progress Notes (Signed)
Pt transported from on vent from OR to 2H11 without any complications.

## 2018-11-17 NOTE — Progress Notes (Signed)
Pharmacy Antibiotic Note  Keith Nguyen. is a 68 y.o. male admitted on 11/13/2018 with surgical prophylaxis.  Pharmacy has been consulted for vancomycin dosing.  Underwent LVAD implantation and AV repair on 10/26. WBC WNL, Scr 1.15 (CrCl 58 mL/min) pre-op.   Plan: Vancomycin 750 mg IV every 12 hours for 48 hours  Monitor renal fx, cx results, clinical pic, and duration of vanc   Height: 5\' 11"  (180.3 cm) Weight: 149 lb 0.5 oz (67.6 kg) IBW/kg (Calculated) : 75.3  Temp (24hrs), Avg:97.6 F (36.4 C), Min:97.5 F (36.4 C), Max:97.7 F (36.5 C)  Recent Labs  Lab 11/13/18 0251 11/14/18 0413 11/15/18 0339 11/16/18 0337  11/02/2018 0500 11/16/2018 0810 10/24/2018 0913 11/06/2018 1024 10/24/2018 1117 11/22/2018 1153  WBC 5.5 4.7 4.7 4.5  --  4.4  --   --   --   --   --   CREATININE 1.12 1.11 1.12 1.16   < > 1.15 0.90 1.00 0.80 0.80 0.80   < > = values in this interval not displayed.    Estimated Creatinine Clearance: 83.3 mL/min (by C-G formula based on SCr of 0.8 mg/dL).    Allergies  Allergen Reactions  . Lipitor [Atorvastatin]     Elevated liver enzymes    Thank you for allowing pharmacy to be a part of this patient's care.  Antonietta Jewel, PharmD, BCCCP Clinical Pharmacist  Phone: [phone]  Please check AMION for all [facility] phone numbers After 10:00 PM, call [place] [phone] 10/27/2018 2:14 PM

## 2018-11-17 NOTE — OR Nursing (Signed)
1203 2 heart charge nurse called and given 45 min ETA.

## 2018-11-17 NOTE — Anesthesia Procedure Notes (Signed)
Central Venous Catheter Insertion Performed by: Oleta Mouse, MD, anesthesiologist Start/End10/25/2020 6:42 AM, 11/17/2018 6:49 AM Patient location: Pre-op. Preanesthetic checklist: patient identified, IV checked, site marked, risks and benefits discussed, surgical consent, monitors and equipment checked, pre-op evaluation, timeout performed and anesthesia consent Hand hygiene performed  and maximum sterile barriers used  PA cath was placed.Swan type:thermodilution Procedure performed without using ultrasound guided technique. Attempts: 1 Patient tolerated the procedure well with no immediate complications.

## 2018-11-17 NOTE — Plan of Care (Signed)

## 2018-11-18 ENCOUNTER — Inpatient Hospital Stay (HOSPITAL_COMMUNITY): Payer: 59

## 2018-11-18 DIAGNOSIS — Z95811 Presence of heart assist device: Secondary | ICD-10-CM

## 2018-11-18 DIAGNOSIS — I5023 Acute on chronic systolic (congestive) heart failure: Secondary | ICD-10-CM | POA: Diagnosis not present

## 2018-11-18 DIAGNOSIS — I5021 Acute systolic (congestive) heart failure: Secondary | ICD-10-CM | POA: Diagnosis not present

## 2018-11-18 LAB — POCT I-STAT 7, (LYTES, BLD GAS, ICA,H+H)
Acid-base deficit: 3 mmol/L — ABNORMAL HIGH (ref 0.0–2.0)
Acid-base deficit: 4 mmol/L — ABNORMAL HIGH (ref 0.0–2.0)
Acid-base deficit: 4 mmol/L — ABNORMAL HIGH (ref 0.0–2.0)
Acid-base deficit: 4 mmol/L — ABNORMAL HIGH (ref 0.0–2.0)
Bicarbonate: 21.8 mmol/L (ref 20.0–28.0)
Bicarbonate: 20.5 mmol/L (ref 20.0–28.0)
Bicarbonate: 21.2 mmol/L (ref 20.0–28.0)
Bicarbonate: 20.9 mmol/L (ref 20.0–28.0)
Calcium, Ion: 1.21 mmol/L (ref 1.15–1.40)
Calcium, Ion: 1.24 mmol/L (ref 1.15–1.40)
Calcium, Ion: 1.25 mmol/L (ref 1.15–1.40)
Calcium, Ion: 1.21 mmol/L (ref 1.15–1.40)
HCT: 23 % — ABNORMAL LOW (ref 39.0–52.0)
HCT: 26 % — ABNORMAL LOW (ref 39.0–52.0)
HCT: 29 % — ABNORMAL LOW (ref 39.0–52.0)
HCT: 26 % — ABNORMAL LOW (ref 39.0–52.0)
Hemoglobin: 7.8 g/dL — ABNORMAL LOW (ref 13.0–17.0)
Hemoglobin: 8.8 g/dL — ABNORMAL LOW (ref 13.0–17.0)
Hemoglobin: 9.9 g/dL — ABNORMAL LOW (ref 13.0–17.0)
Hemoglobin: 8.8 g/dL — ABNORMAL LOW (ref 13.0–17.0)
O2 Saturation: 99 %
O2 Saturation: 98 %
O2 Saturation: 96 %
O2 Saturation: 99 %
Patient temperature: 37.8
Patient temperature: 37.3
Patient temperature: 37.7
Patient temperature: 37.5
Potassium: 5.2 mmol/L — ABNORMAL HIGH (ref 3.5–5.1)
Potassium: 4.8 mmol/L (ref 3.5–5.1)
Potassium: 4.5 mmol/L (ref 3.5–5.1)
Potassium: 4.4 mmol/L (ref 3.5–5.1)
Sodium: 137 mmol/L (ref 135–145)
Sodium: 136 mmol/L (ref 135–145)
Sodium: 136 mmol/L (ref 135–145)
Sodium: 135 mmol/L (ref 135–145)
TCO2: 23 mmol/L (ref 22–32)
TCO2: 22 mmol/L (ref 22–32)
TCO2: 22 mmol/L (ref 22–32)
TCO2: 22 mmol/L (ref 22–32)
pCO2 arterial: 36.8 mmHg (ref 32.0–48.0)
pCO2 arterial: 36.6 mmHg (ref 32.0–48.0)
pCO2 arterial: 37.4 mmHg (ref 32.0–48.0)
pCO2 arterial: 35 mmHg (ref 32.0–48.0)
pH, Arterial: 7.385 (ref 7.350–7.450)
pH, Arterial: 7.358 (ref 7.350–7.450)
pH, Arterial: 7.365 (ref 7.350–7.450)
pH, Arterial: 7.386 (ref 7.350–7.450)
pO2, Arterial: 131 mmHg — ABNORMAL HIGH (ref 83.0–108.0)
pO2, Arterial: 117 mmHg — ABNORMAL HIGH (ref 83.0–108.0)
pO2, Arterial: 91 mmHg (ref 83.0–108.0)
pO2, Arterial: 119 mmHg — ABNORMAL HIGH (ref 83.0–108.0)

## 2018-11-18 LAB — BASIC METABOLIC PANEL
Anion gap: 8 (ref 5–15)
BUN: 14 mg/dL (ref 8–23)
CO2: 21 mmol/L — ABNORMAL LOW (ref 22–32)
Calcium: 8.6 mg/dL — ABNORMAL LOW (ref 8.9–10.3)
Chloride: 105 mmol/L (ref 98–111)
Creatinine, Ser: 1.12 mg/dL (ref 0.61–1.24)
GFR calc Af Amer: >60 mL/min (ref >60)
GFR calc non Af Amer: >60 mL/min (ref >60)
Glucose, Bld: 116 mg/dL — ABNORMAL HIGH (ref 70–99)
Potassium: 4.4 mmol/L (ref 3.5–5.1)
Sodium: 134 mmol/L — ABNORMAL LOW (ref 135–145)

## 2018-11-18 LAB — PREPARE FRESH FROZEN PLASMA
Unit division: 0
Unit division: 0

## 2018-11-18 LAB — PREPARE PLATELET PHERESIS
Unit division: 0
Unit division: 0

## 2018-11-18 LAB — GLUCOSE, CAPILLARY
Glucose-Capillary: 132 mg/dL — ABNORMAL HIGH (ref 70–99)
Glucose-Capillary: 133 mg/dL — ABNORMAL HIGH (ref 70–99)
Glucose-Capillary: 137 mg/dL — ABNORMAL HIGH (ref 70–99)
Glucose-Capillary: 127 mg/dL — ABNORMAL HIGH (ref 70–99)
Glucose-Capillary: 135 mg/dL — ABNORMAL HIGH (ref 70–99)
Glucose-Capillary: 125 mg/dL — ABNORMAL HIGH (ref 70–99)
Glucose-Capillary: 128 mg/dL — ABNORMAL HIGH (ref 70–99)
Glucose-Capillary: 139 mg/dL — ABNORMAL HIGH (ref 70–99)
Glucose-Capillary: 124 mg/dL — ABNORMAL HIGH (ref 70–99)
Glucose-Capillary: 132 mg/dL — ABNORMAL HIGH (ref 70–99)
Glucose-Capillary: 121 mg/dL — ABNORMAL HIGH (ref 70–99)
Glucose-Capillary: 130 mg/dL — ABNORMAL HIGH (ref 70–99)
Glucose-Capillary: 117 mg/dL — ABNORMAL HIGH (ref 70–99)
Glucose-Capillary: 146 mg/dL — ABNORMAL HIGH (ref 70–99)

## 2018-11-18 LAB — CBC WITH DIFFERENTIAL/PLATELET
Abs Immature Granulocytes: 0.03 10*3/uL (ref 0.00–0.07)
Basophils Absolute: 0 10*3/uL (ref 0.0–0.1)
Basophils Relative: 0 %
Eosinophils Absolute: 0.1 10*3/uL (ref 0.0–0.5)
Eosinophils Relative: 1 %
HCT: 22 % — ABNORMAL LOW (ref 39.0–52.0)
Hemoglobin: 7.4 g/dL — ABNORMAL LOW (ref 13.0–17.0)
Immature Granulocytes: 0 %
Lymphocytes Relative: 9 %
Lymphs Abs: 0.7 10*3/uL (ref 0.7–4.0)
MCH: 30.2 pg (ref 26.0–34.0)
MCHC: 33.6 g/dL (ref 30.0–36.0)
MCV: 89.8 fL (ref 80.0–100.0)
Monocytes Absolute: 0.8 10*3/uL (ref 0.1–1.0)
Monocytes Relative: 10 %
Neutro Abs: 6.6 10*3/uL (ref 1.7–7.7)
Neutrophils Relative %: 80 %
Platelets: 119 10*3/uL — ABNORMAL LOW (ref 150–400)
RBC: 2.45 MIL/uL — ABNORMAL LOW (ref 4.22–5.81)
RDW: 16.1 % — ABNORMAL HIGH (ref 11.5–15.5)
WBC: 8.2 10*3/uL (ref 4.0–10.5)
nRBC: 0 % (ref 0.0–0.2)

## 2018-11-18 LAB — CBC
HCT: 24.4 % — ABNORMAL LOW (ref 39.0–52.0)
Hemoglobin: 8.3 g/dL — ABNORMAL LOW (ref 13.0–17.0)
MCH: 30.9 pg (ref 26.0–34.0)
MCHC: 34 g/dL (ref 30.0–36.0)
MCV: 90.7 fL (ref 80.0–100.0)
Platelets: 90 10*3/uL — ABNORMAL LOW (ref 150–400)
RBC: 2.69 MIL/uL — ABNORMAL LOW (ref 4.22–5.81)
RDW: 15.9 % — ABNORMAL HIGH (ref 11.5–15.5)
WBC: 14.2 10*3/uL — ABNORMAL HIGH (ref 4.0–10.5)
nRBC: 0 % (ref 0.0–0.2)

## 2018-11-18 LAB — BPAM FFP
Blood Product Expiration Date: 202010302359
Blood Product Expiration Date: 202010302359
Blood Product Expiration Date: 202010312359
Blood Product Expiration Date: 202010312359
ISSUE DATE / TIME: 202010260916
ISSUE DATE / TIME: 202010260916
ISSUE DATE / TIME: 202010261152
ISSUE DATE / TIME: 202010261152
Unit Type and Rh: 6200
Unit Type and Rh: 6200
Unit Type and Rh: 5100
Unit Type and Rh: 9500

## 2018-11-18 LAB — COMPREHENSIVE METABOLIC PANEL
ALT: 18 U/L (ref 0–44)
AST: 120 U/L — ABNORMAL HIGH (ref 15–41)
Albumin: 3.2 g/dL — ABNORMAL LOW (ref 3.5–5.0)
Alkaline Phosphatase: 33 U/L — ABNORMAL LOW (ref 38–126)
Anion gap: 7 (ref 5–15)
BUN: 15 mg/dL (ref 8–23)
CO2: 21 mmol/L — ABNORMAL LOW (ref 22–32)
Calcium: 8.1 mg/dL — ABNORMAL LOW (ref 8.9–10.3)
Chloride: 106 mmol/L (ref 98–111)
Creatinine, Ser: 1.02 mg/dL (ref 0.61–1.24)
GFR calc Af Amer: >60 mL/min (ref >60)
GFR calc non Af Amer: >60 mL/min (ref >60)
Glucose, Bld: 128 mg/dL — ABNORMAL HIGH (ref 70–99)
Potassium: 5.2 mmol/L — ABNORMAL HIGH (ref 3.5–5.1)
Sodium: 134 mmol/L — ABNORMAL LOW (ref 135–145)
Total Bilirubin: 2.6 mg/dL — ABNORMAL HIGH (ref 0.3–1.2)
Total Protein: 5.1 g/dL — ABNORMAL LOW (ref 6.5–8.1)

## 2018-11-18 LAB — COOXEMETRY PANEL
Carboxyhemoglobin: 1.6 % — ABNORMAL HIGH (ref 0.5–1.5)
Methemoglobin: 1.4 % (ref 0.0–1.5)
O2 Saturation: 71.5 %
Total hemoglobin: 7.5 g/dL — ABNORMAL LOW (ref 12.0–16.0)

## 2018-11-18 LAB — HEMOGLOBIN AND HEMATOCRIT, BLOOD
HCT: 26.2 % — ABNORMAL LOW (ref 39.0–52.0)
Hemoglobin: 8.6 g/dL — ABNORMAL LOW (ref 13.0–17.0)

## 2018-11-18 LAB — BPAM PLATELET PHERESIS
Blood Product Expiration Date: 202010282359
Blood Product Expiration Date: 202010282359
ISSUE DATE / TIME: 202010261048
ISSUE DATE / TIME: 202010261152
Unit Type and Rh: 5100
Unit Type and Rh: 7300

## 2018-11-18 LAB — PROTIME-INR
INR: 1.3 — ABNORMAL HIGH (ref 0.8–1.2)
Prothrombin Time: 16.2 seconds — ABNORMAL HIGH (ref 11.4–15.2)

## 2018-11-18 LAB — BRAIN NATRIURETIC PEPTIDE: B Natriuretic Peptide: 536.2 pg/mL — ABNORMAL HIGH (ref 0.0–100.0)

## 2018-11-18 LAB — MAGNESIUM
Magnesium: 2.2 mg/dL (ref 1.7–2.4)
Magnesium: 1.9 mg/dL (ref 1.7–2.4)

## 2018-11-18 LAB — SURGICAL PATHOLOGY

## 2018-11-18 LAB — PREPARE RBC (CROSSMATCH)

## 2018-11-18 LAB — PHOSPHORUS: Phosphorus: 3.7 mg/dL (ref 2.5–4.6)

## 2018-11-18 LAB — LACTATE DEHYDROGENASE: LDH: 309 U/L — ABNORMAL HIGH (ref 98–192)

## 2018-11-18 MED ORDER — SODIUM CHLORIDE 0.9% IV SOLUTION: Freq: Once | INTRAVENOUS | Status: DC

## 2018-11-18 MED ORDER — ALBUMIN HUMAN 5 % IV SOLN
INTRAVENOUS | Status: AC
  Administered 2018-11-18: 14:00:00 25 g via INTRAVENOUS
  Filled 2018-11-18: qty 500

## 2018-11-18 MED ORDER — ORAL CARE MOUTH RINSE
15.0000 mL | Freq: Two times a day (BID) | OROMUCOSAL | Status: DC
  Administered 2018-11-18 – 2018-11-23 (×8): 15 mL via OROMUCOSAL

## 2018-11-18 MED ORDER — ALBUMIN HUMAN 5 % IV SOLN
25.0000 g | Freq: Once | INTRAVENOUS | Status: AC
  Administered 2018-11-18: 14:00:00 25 g via INTRAVENOUS

## 2018-11-18 MED ORDER — INSULIN ASPART 100 UNIT/ML ~~LOC~~ SOLN
0.0000 [IU] | SUBCUTANEOUS | Status: DC
  Administered 2018-11-18 – 2018-11-21 (×6): 2 [IU] via SUBCUTANEOUS

## 2018-11-18 MED ORDER — WARFARIN SODIUM 2 MG PO TABS
2.0000 mg | ORAL_TABLET | Freq: Once | ORAL | Status: AC
  Administered 2018-11-18: 20:00:00 2 mg via ORAL
  Filled 2018-11-18: qty 1

## 2018-11-18 MED ORDER — HYDRALAZINE HCL 20 MG/ML IJ SOLN
10.0000 mg | INTRAMUSCULAR | Status: DC | PRN
  Administered 2018-11-19 – 2018-11-20 (×3): 10 mg via INTRAVENOUS
  Filled 2018-11-18 (×3): qty 1

## 2018-11-18 MED ORDER — WARFARIN - PHARMACIST DOSING INPATIENT
Freq: Every day | Status: DC
  Administered 2018-11-20 – 2018-11-22 (×3)

## 2018-11-18 MED ORDER — FUROSEMIDE 10 MG/ML IJ SOLN
40.0000 mg | Freq: Once | INTRAMUSCULAR | Status: AC
  Administered 2018-11-18: 09:00:00 40 mg via INTRAVENOUS
  Filled 2018-11-18: qty 4

## 2018-11-18 MED ORDER — SODIUM CHLORIDE 0.9% IV SOLUTION
Freq: Once | INTRAVENOUS | Status: AC
  Administered 2018-11-18: 05:00:00 via INTRAVENOUS

## 2018-11-18 MED FILL — Cefuroxime Sodium For Inj 750 MG: INTRAMUSCULAR | Qty: 750 | Status: AC

## 2018-11-18 MED FILL — Potassium Chloride Inj 2 mEq/ML: INTRAVENOUS | Qty: 40 | Status: AC

## 2018-11-18 MED FILL — Heparin Sodium (Porcine) Inj 1000 Unit/ML: INTRAMUSCULAR | Qty: 30 | Status: AC

## 2018-11-18 MED FILL — Lidocaine HCl Local Preservative Free (PF) Inj 2%: INTRAMUSCULAR | Qty: 15 | Status: AC

## 2018-11-18 NOTE — Progress Notes (Signed)
1 Day Post-Op Procedure(s) (LRB): INSERTION OF IMPLANTABLE LEFT VENTRICULAR ASSIST DEVICE (N/A) AORTIC VALVE REPAIR (N/A) TRANSESOPHAGEAL ECHOCARDIOGRAM (TEE) (N/A) Subjective: Responds to commands--remains on ventilator  Objective: Vital signs in last 24 hours: Temp:  [95.9 F (35.5 C)-100.4 F (38 C)] 99.7 F (37.6 C) (10/27 0700) Pulse Rate:  [82-231] 122 (10/27 0700) Cardiac Rhythm: Normal sinus rhythm (10/26 2000) Resp:  [12-28] 25 (10/27 0700) BP: (59-134)/(40-123) 83/71 (10/27 0800) SpO2:  [77 %-97 %] 88 % (10/27 0700) Arterial Line BP: (74-111)/(54-85) 79/60 (10/27 0700) FiO2 (%):  [50 %] 50 % (10/27 0300) Weight:  [78.8 kg] 78.8 kg (10/27 0439)  Hemodynamic parameters for last 24 hours: PAP: (23-40)/(8-21) 35/19 CVP:  [3 mmHg-15 mmHg] 10 mmHg CO:  [4.3 L/min-7 L/min] 4.3 L/min CI:  [2.3 L/min/m2-3.8 L/min/m2] 2.3 L/min/m2  Intake/Output from previous day: 10/26 0701 - 10/27 0700 In: 9299.7 [I.V.:5781.2; Blood:2381; IV Piggyback:1137.6] Out: [id number]:[id number] [Urine:4783; Blood:1050; Chest Tube:1210] Intake/Output this shift: No intake/output data recorded.  General appearance: alert and no distress Neurologic: intact Heart: regular rate and rhythm, S1, S2 normal, no murmur, click, rub or gallop Lungs: clear to auscultation bilaterally Abdomen: soft, non-tender; bowel sounds normal; no masses,  no organomegaly Extremities: extremities normal, atraumatic, no cyanosis or edema Wound: dressed  Lab Results: Recent Labs    11/21/2018 1944  11/18/18 0256 11/18/18 0400  WBC 8.3  --  8.2  --   HGB 8.7*   < > 7.4* 7.8*  HCT 26.5*   < > 22.0* 23.0*  PLT 135*  --  119*  --    < > = values in this interval not displayed.   BMET:  Recent Labs    10/28/2018 1944  11/18/18 0256 11/18/18 0400  NA 135   < > 134* 137  K 5.2*   < > 5.2* 5.2*  CL 106  --  106  --   CO2 22  --  21*  --   GLUCOSE 134*  --  128*  --   BUN 16  --  15  --   CREATININE 0.98  --  1.02  --   CALCIUM  8.2*  --  8.1*  --    < > = values in this interval not displayed.    PT/INR:  Recent Labs    11/18/18 0256  LABPROT 16.2*  INR 1.3*   ABG    Component Value Date/Time   PHART 7.385 11/18/2018 0400   HCO3 21.8 11/18/2018 0400   TCO2 23 11/18/2018 0400   ACIDBASEDEF 3.0 (H) 11/18/2018 0400   O2SAT 71.5 11/18/2018 0416   CBG (last 3)  Recent Labs    11/18/18 0453 11/18/18 0621 11/18/18 0734  GLUCAP 125* 128* 139*    Assessment/Plan: S/P Procedure(s) (LRB): INSERTION OF IMPLANTABLE LEFT VENTRICULAR ASSIST DEVICE (N/A) AORTIC VALVE REPAIR (N/A) TRANSESOPHAGEAL ECHOCARDIOGRAM (TEE) (N/A) extubate; slow drip wean   LOS: 14 days    Keith Nguyen 11/18/2018

## 2018-11-18 NOTE — Anesthesia Postprocedure Evaluation (Signed)
Anesthesia Post Note  Patient: Keith Nguyen.  Procedure(s) Performed: INSERTION OF IMPLANTABLE LEFT VENTRICULAR ASSIST DEVICE (N/A Chest) AORTIC VALVE REPAIR (N/A ) TRANSESOPHAGEAL ECHOCARDIOGRAM (TEE) (N/A )     Patient location during evaluation: SICU Anesthesia Type: General Level of consciousness: awake and alert Pain management: pain level controlled Vital Signs Assessment: post-procedure vital signs reviewed and stable Respiratory status: spontaneous breathing, nonlabored ventilation, respiratory function stable and patient connected to nasal cannula oxygen Cardiovascular status: blood pressure returned to baseline and stable Postop Assessment: no apparent nausea or vomiting Anesthetic complications: no Comments: Pt doing well. Minimal pain. Patient and family in good spirits. No vasopressors.    Last Vitals:  Vitals:   11/18/18 1851 11/18/18 1900  BP:  (!) 106/94  Pulse:    Resp: (!) 36 (!) 39  Temp: 37.7 C 37.8 C  SpO2:      Last Pain:  Vitals:   11/18/18 1825  TempSrc:   PainSc: [name]

## 2018-11-18 NOTE — Progress Notes (Addendum)
Advanced Heart Failure VAD Team Note  PCP-Cardiologist: No primary care provider on file.   Subjective:    Post Op Day 1 HMIII LVAD Aortic Valve Closure.   Remains on epi 2.5 mcg + milrinone 0.25 mcg.   Awake on the vent.   Swan #s CVP 8-9 PA 38/21   CO 4.5 CI 2.4    LVAD INTERROGATION:  HeartMate III LVAD:   Flow 4.5 liters/min, speed 5400, power 3.8 , PI 1/8   Objective:    Vital Signs:   Temp:  [95.9 F (35.5 C)-100.4 F (38 C)] 99.7 F (37.6 C) (10/27 0700) Pulse Rate:  [82-231] 122 (10/27 0700) Resp:  [12-28] 25 (10/27 0700) BP: (59-134)/(40-123) 91/59 (10/27 0645) SpO2:  [77 %-97 %] 88 % (10/27 0700) Arterial Line BP: (74-111)/(54-85) 79/60 (10/27 0700) FiO2 (%):  [50 %] 50 % (10/27 0300) Weight:  [78.8 kg] 78.8 kg (10/27 0439) Last BM Date: 11/15/18 Mean arterial Pressure 70 s  Intake/Output:   Intake/Output Summary (Last 24 hours) at 11/18/2018 0800 Last data filed at 11/18/2018 0700 Gross per 24 hour  Intake 9299.73 ml  Output 7043 ml  Net 2256.73 ml     Physical Exam   CVP 8-9 General:  On vent.  Awake.  HEENT: ETT Neck: supple. JVP 8-9 . Carotids 2+ bilat; no bruits. No lymphadenopathy or thyromegaly appreciated. Cor: Mechanical heart sounds with LVAD hum present. Lungs: clear Abdomen: soft, nontender, nondistended. No hepatosplenomegaly. No bruits or masses. Good bowel sounds. Driveline: C/D/I; securement device intact  Extremities: no cyanosis, clubbing, rash, edema Neuro: on vent, awake. Follows commands MAE x4   Telemetry   SR 80s personally reviewed.   EKG  N/A  Labs   Basic Metabolic Panel: Recent Labs  Lab 11/16/18 2146 11/10/2018 0500  10/31/2018 1117 11/04/2018 1153  11/14/2018 1335  11/07/2018 1520 11/19/2018 1944 11/20/2018 1952 11/18/18 0256 11/18/18 0400  NA 129* 132*   < > 133* 136   < > 135   < > 138 135 136 134* 137  K 4.6 4.8   < > 6.5* 4.8   < > 4.7   < > 4.6 5.2* 5.2* 5.2* 5.2*  CL 97* 100   < > 100 102  --  105   --   --  106  --  106  --   CO2 22 23  --   --   --   --  24  --   --  22  --  21*  --   GLUCOSE 122* 79   < > 117* 107*  --  109*  --   --  134*  --  128*  --   BUN 29* 26*   < > 23 23  --  18  --   --  16  --  15  --   CREATININE 1.20 1.15   < > 0.80 0.80  --  0.94  --   --  0.98  --  1.02  --   CALCIUM 9.3 9.2  --   --   --   --  7.9*  --   --  8.2*  --  8.1*  --   MG  --   --   --   --   --   --  2.2  --   --  2.8*  --  2.2  --   PHOS  --   --   --   --   --   --   --   --   --   --   --  3.7  --    < > = values in this interval not displayed.    Liver Function Tests: Recent Labs  Lab 11/12/18 0458 11/16/18 2146 11/18/18 0256  AST 46* 54* 120*  ALT _0 ALKPHOS 70 66 33*  BILITOT 1.8* 0.7 2.6*  PROT 7.6 7.2 5.1*  ALBUMIN 2.9* 2.8* 3.2*   No results for input(s): LIPASE, AMYLASE in the last 168 hours. No results for input(s): AMMONIA in the last 168 hours.  CBC: Recent Labs  Lab 11/13/18 0251 11/14/18 0413 11/15/18 0339 11/16/18 0337 11/02/2018 0500  11/15/2018 1022  11/20/2018 1335  10/25/2018 1520 11/12/2018 1800 11/16/2018 1944 10/27/2018 1952 11/18/18 0256 11/18/18 0400  WBC 5.5 4.7 4.7 4.5 4.4  --   --   --  12.6*  --   --   --  8.3  --  8.2  --   NEUTROABS 3.0 2.7 2.3 2.0  --   --   --   --   --   --   --   --   --   --  6.6  --   HGB 15.1 14.7 15.1 14.7 14.3   < > 9.7*   < > 9.9*   < > 8.2*  --  8.7* 8.8* 7.4* 7.8*  HCT 44.9 44.5 45.2 43.1 42.9   < > 28.9*   < > 29.4*   < > 24.0*  --  26.5* 26.0* 22.0* 23.0*  MCV 86.8 89.4 88.6 88.5 89.6  --   --   --  90.7  --   --   --  91.1  --  89.8  --   PLT 174 167 185 209 183  --  92*  --  130*  --   --  123* 135*  --  119*  --    < > = values in this interval not displayed.    INR: Recent Labs  Lab 11/16/18 2146 10/23/2018 1335 10/29/2018 1800 11/18/18 0256  INR 1.0 1.3* 1.4* 1.3*    Other results:  EKG:    Imaging   Dg Chest Port 1 View  Result Date: 11/18/2018 CLINICAL DATA:  Left ventricular assist  device. EXAM: PORTABLE CHEST 1 VIEW COMPARISON:  November 17, 2018. FINDINGS: Stable cardiomediastinal silhouette. Endotracheal and nasogastric tubes are unchanged in position. Right internal jugular Swan-Ganz catheter is noted with tip directed toward right pulmonary artery. Left ventricular assist device is unchanged position. Left-sided chest tube is noted without pneumothorax. Right lung is clear. Mild left basilar atelectasis is noted. Bony thorax is unremarkable. IMPRESSION: Stable left-sided chest tube without pneumothorax. Stable support apparatus. Stable left basilar subsegmental atelectasis. Electronically Signed   By: Marijo Conception M.D.   On: 11/18/2018 07:10   Dg Chest Port 1 View  Result Date: 10/31/2018 CLINICAL DATA:  Post LVAD EXAM: PORTABLE CHEST 1 VIEW COMPARISON:  Portable exam 1430 hours compared to 11/16/2018 FINDINGS: Tip of endotracheal tube projects 3.6 cm above carina. Tip of nasogastric tube projects over distal esophagus; recommend advancing tube 12 cm to place proximal side-port at expected position of proximal stomach. RIGHT jugular Swan-Ganz catheter with tip projecting over central RIGHT pulmonary artery. Mediastinal drain present. LVAD projects over cardiac apex. Epicardial pacing wires are present. Enlargement of cardiac silhouette. Atherosclerotic calcification aorta. Bibasilar atelectasis greater on LEFT. Minimal bullous disease at LEFT apex. No acute infiltrate or pneumothorax. IMPRESSION: Postsurgical changes as above. Recommend advancing nasogastric tube 12 cm. Electronically Signed   By: Elta Guadeloupe  Thornton Papas M.D.   On: 10/29/2018 14:54   Dg Chest Port 1 View  Result Date: 11/16/2018 CLINICAL DATA:  Preoperative cardiovascular examination EXAM: PORTABLE CHEST 1 VIEW COMPARISON:  Radiograph, CT 11/07/2018 FINDINGS: Right PICC terminates in the lower SVC. Small residual right pleural effusion. No focal consolidation. No pneumothorax. Cardiomediastinal contours are stable  including a calcified slightly tortuous aorta. Degenerative changes in the shoulders and spine. No acute osseous or soft tissue abnormality. IMPRESSION: 1. Small residual right pleural effusion. 2. Right PICC terminates in the lower SVC. 3.  Aortic Atherosclerosis (ICD10-I70.0). Electronically Signed   By: Lovena Le M.D.   On: 11/16/2018 22:10   Dg Abd Portable 1v  Result Date: 10/31/2018 CLINICAL DATA:  OG tube placement EXAM: PORTABLE ABDOMEN - 1 VIEW COMPARISON:  None FINDINGS: The enteric tube appears to project over the gastric body/fundus. There is and LVAD in place. The bowel gas pattern is nonspecific and nonobstructive. IMPRESSION: Enteric tube projects over the gastric body/fundus. Electronically Signed   By: Constance Holster M.D.   On: 11/22/2018 17:08      Medications:     Scheduled Medications: . acetaminophen  1,000 mg Oral Q6H   Or  . acetaminophen (TYLENOL) oral liquid 160 mg/5 mL  1,000 mg Per Tube Q6H  . allopurinol  100 mg Oral Daily  . aspirin EC  325 mg Oral Daily   Or  . aspirin  324 mg Per Tube Daily   Or  . aspirin  300 mg Rectal Daily  . bisacodyl  10 mg Oral Daily   Or  . bisacodyl  10 mg Rectal Daily  . chlorhexidine gluconate (MEDLINE KIT)  15 mL Mouth Rinse BID  . Chlorhexidine Gluconate Cloth  6 each Topical Daily  . docusate sodium  200 mg Oral Daily  . insulin regular  0-10 Units Intravenous TID WC  . loratadine  10 mg Oral Daily  . mouth rinse  15 mL Mouth Rinse 10 times per day  . [START ON 11/19/2018] pantoprazole  40 mg Oral Daily  . rifampin  600 mg Oral Once  . sodium chloride flush  10-40 mL Intracatheter Q12H  . sodium chloride flush  3 mL Intravenous Q12H  . sodium chloride flush  3 mL Intravenous Q12H  . sodium chloride flush  3 mL Intravenous Q12H  . sodium chloride flush  3 mL Intravenous Q12H     Infusions: . sodium chloride Stopped (11/18/18 0501)  . sodium chloride    . sodium chloride    . sodium chloride    . sodium  chloride    . cefUROXime (ZINACEF)  IV 1.5 g (11/18/18 0745)  . dexmedetomidine (PRECEDEX) IV infusion 0.2 mcg/kg/hr (11/18/18 0700)  . epinephrine 2.5 mcg/min (11/18/18 0700)  . insulin 0.5 Units/hr (11/10/2018 1330)  . lactated ringers 100 mL/hr at 11/18/18 0700  . lactated ringers Stopped (11/04/2018 1610)  . milrinone 0.25 mcg/kg/min (11/18/18 0700)  . nitroGLYCERIN Stopped (11/21/2018 1738)  . norepinephrine (LEVOPHED) Adult infusion 1 mcg/min (11/08/2018 2128)  . vancomycin Stopped (10/29/2018 2140)     PRN Medications:  sodium chloride, sodium chloride, acetaminophen, albuterol, bisacodyl, bisacodyl, methocarbamol, midazolam, morphine injection, nitroGLYCERIN, ondansetron (ZOFRAN) IV, ondansetron **OR** [DISCONTINUED] ondansetron (ZOFRAN) IV, oxyCODONE, sodium chloride, sodium chloride flush, sodium chloride flush, sodium chloride flush, traMADol     Assessment/Plan:     1. S/P HMIII DT Anticipate extubation today. Weaning epi. Continue milrinone.  Hgb down with expected blood loss. Received 1UPRBC 10/27 Continue asa 325  until INR 2 then will drop to 81 mg per hour.  LDH 309  INR 1.3  Start warfarin timing per surgery  2. Acute on chronic systolic CHF: Nonischemic cardiomyopathy diagnosed in 2017 with echo showing EF 40% and LHC showing mild nonobstructive CAD. He saw Dr. Wynonia Lawman in the past, but no cardiology evaluation since 2017. 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 He has drunk moderate ETOH in the past (no longer drinks) but does not seem to have drunk enough to cause a cardiomyopathy. He was markedly volume overloaded on exam initially with biventricular failure and NYHA class IV at admission with rise in creatinine concerning for cardiorenal syndrome. Initial co-ox 40% suggested low CO, milrinone started and increased  to 0.375.    RHC showed R>L heart failure.  Cardiac output looked good on milrinone by Fick but was low by thermodilution (discordant values).  Repeat LHC/RHC showed very low filling pressures and good cardiac index by Fick.  No significant coronary disease noted.   S/P HMIII LVAD CVP 8-9. Anticipate starting diuretics.   2. AKI on CKD stage 3: Suspect this may be a combination of cardiorenal syndrome and contrast nephropathy (had CTA chest at admission). Minimal contrast with coronary angiography - creatinine stable at 1.02 today.   3. 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 He was drinking moderate ETOH as well but has quit completely for about 1 month.  Childs A per GI and should be stable for surgery.   4. COPD/smoking: He recently quit smoking.CT chest with moderate emphysema. He is now off oxygen. Minimal obstruction on PFTs.  5. Confusion:  Completely resolved.  Had overnight 10/14-15.  NH3 was normal, doubt hepatic encephalopathy.  No recent ETOH, not due to ETOH withdrawal.  Think may have been due to low output and hospitalization.  CT head unremarkable.  Resolved prior to surgery   6. Hyponatremia:  -Resolved.    7. Aortic insufficiency: ?Moderate range. AV closure with LVAD 10/24/2018   I reviewed the LVAD parameters from today, and compared the results to the patient's prior recorded data.  No programming changes were made.  The LVAD is functioning within specified parameters.  The patient performs LVAD self-test daily.  LVAD interrogation was negative for any significant power changes, alarms or PI events/speed drops.  LVAD equipment check completed and is in good working order.  Back-up equipment present.   LVAD education done on emergency procedures and precautions and reviewed exit site care.  Length of Stay: [place], NP 11/18/2018, 8:00 AM  VAD Team --- VAD ISSUES ONLY--- Pager [phone] (7am - 7am)  Advanced Heart  Failure Team  Pager [phone] (M-F; 7a - 4p)  Please contact [facility] for night-coverage after hours (4p -7a ) and weekends on [email or web address]  Patient seen with NP, agree with the above note.  He is doing well post-LVAD placement.  CI 2.4 off Swan with CVP 8-9. He remains on epinephrine 2.5 and milrinone 0.25.   General: Intubated, awake.  HEENT: Normal. Neck: Supple, JVP 8 cm. Carotids OK.  Cardiac:  Mechanical heart sounds with LVAD hum present.  Lungs:  CTAB, normal effort.  Abdomen:  NT, ND, no HSM. No bruits or masses. +BS  LVAD exit site: Well-healed and incorporated. Dressing dry and intact. No erythema or drainage. Stabilization device  present and accurately applied. Driveline dressing changed daily per sterile technique. Extremities:  Warm and dry. No cyanosis, clubbing, rash, or edema.  Neuro:  Awake on vent, follows commands.   Plan for extubation this morning.  Will give Lasix 40 mg IV x 1 peri-extubation. Creatinine remains stable.   Continue milrinone, slow wean of epinephrine.  Good cardiac output by co-ox and Swan.   ASA 325 until INR >2, start warfarin timing per surgery.   VAD indices stable.   CRITICAL CARE Performed by: Loralie Champagne  Total critical care time: 35 minutes  Critical care time was exclusive of separately billable procedures and treating other patients.  Critical care was necessary to treat or prevent imminent or life-threatening deterioration.  Critical care was time spent personally by me on the following activities: development of treatment plan with patient and/or surrogate as well as nursing, discussions with consultants, evaluation of patient's response to treatment, examination of patient, obtaining history from patient or surrogate, ordering and performing treatments and interventions, ordering and review of laboratory studies, ordering and review of radiographic studies, pulse oximetry and re-evaluation of patient's condition.  Loralie Champagne 11/18/2018 8:23 AM

## 2018-11-18 NOTE — Progress Notes (Signed)
Patient ID: Keith Plass., male   DOB: 1951-01-10, 68 y.o.   MRN: [id number] TCTS Evening Rounds:  Hemodynamically stable on epi 0.5, milrinone 2.5, NO 2ppm. CI 2.8. CVP 4  Urine output good. Chest tube output ok Sitting up in bed talking to family.

## 2018-11-18 NOTE — Progress Notes (Signed)
Pt did -30 on NIF  Pt did > 6mL/kg on VC

## 2018-11-18 NOTE — Progress Notes (Addendum)
LVAD Coordinator Rounding Note:  Admitted 11/08/2018 due to CHF excerbation.   HM3 LVAD implanted on 11/22/2018 by Dr. Orvan Seen under DT criteria.  Pt sitting up in bed weaning from the ventilator. Pt nods appropriately.   Vital signs: Temp: 99 HR: 99 Doppler Pressure: 80 Automatic BP: 90/60 (71) O2 Sat: 100 Wt: 173.7     LVAD interrogation reveals:   Speed: 5400 Flow: 4.7 Power:  3.9 PI: 2.8  Alarms: none since OR yesterday Events:  10+ overnight Hematocrit: 26 Fixed speed: 5400 Low speed limit: 5100   Drive Line:  Existing VAD dressing removed and site care performed using sterile technique. Drive line exit site cleaned with Chlora prep applicators x 2, allowed to dry, and gauze dressing with silver strip re-applied. Exit site with small amount of dried blood, the velour is fully implanted at exit site. No redness, tenderness, drainage, foul odor or rash noted. Drive line anchor secure.      Labs:  LDH trend: 309  INR trend: 1.3  Anticoagulation Plan: -INR Goal: 2-2.5 -ASA Dose: 325 until INR is therapeutic  Blood Products:  -intra-op: 10/28/2018 -2 u PLTs -4 u FFP  Post op: 10/27-20- 1 u PRBCs   Device: -N/A  Respiratory: on vent 40% weaning; NO 1.8  Gtts: Precedex 0.1 mcg/hr Epi 2.5 mcg/min Milrinone 0.25 mcg/kg/min  Renal:  -BUN/CRT: 15/1.02  Adverse Events on VAD: -  VAD education:  1. Pt intubated. Unable to educate at this time. No family at bedside.  Plan/Recommendations:   1. Daily dressing changes by VAD coordinator or nurse champion.  2. Page VAD coordinator with equipment issues or driveline questions.  Tanda Rockers RN, BSN VAD Coordinator 24/7 Pager [phone]

## 2018-11-18 NOTE — Addendum Note (Signed)
Addendum  created 11/18/18 2311 by Effie Berkshire, MD   Order list changed

## 2018-11-18 NOTE — Progress Notes (Signed)
ANTICOAGULATION CONSULT NOTE - Initial Consult  Pharmacy Consult for warfarin Indication: LVAD  Allergies  Allergen Reactions  . Lipitor [Atorvastatin]     Elevated liver enzymes    Patient Measurements: Height: 5\' 11"  (180.3 cm) Weight: 173 lb 11.6 oz (78.8 kg)(LVAD controler & chest tubes) IBW/kg (Calculated) : 75.3  Vital Signs: Temp: 99.9 F (37.7 C) (10/27 1851) Temp Source: Core (10/27 1700) BP: 100/80 (10/27 1730) Pulse Rate: 107 (10/27 1845)  Labs: Recent Labs    11/16/18 2146  11/20/2018 1335  11/20/2018 1800 11/18/2018 1944  11/18/18 0256  11/18/18 1104 11/18/18 1556 11/18/18 1605  HGB  --    < > 9.9*   < >  --  8.7*   < > 7.4*   < > 9.9* 8.3* 8.8*  HCT  --    < > 29.4*   < >  --  26.5*   < > 22.0*   < > 29.0* 24.4* 26.0*  PLT  --    < > 130*  --  123* 135*  --  119*  --   --  90*  --   APTT 31  --  40*  --  45*  --   --   --   --   --   --   --   LABPROT 13.0  --  16.4*  --  16.6*  --   --  16.2*  --   --   --   --   INR 1.0  --  1.3*  --  1.4*  --   --  1.3*  --   --   --   --   CREATININE 1.20   < > 0.94  --   --  0.98  --  1.02  --   --  1.12  --    < > = values in this interval not displayed.    Estimated Creatinine Clearance: 66.3 mL/min (by C-G formula based on SCr of 1.12 mg/dL).   Medical History: Past Medical History:  Diagnosis Date  . Abnormal cardiac function test 09/16/2015  . Aortic atherosclerosis ([place]) 09/16/2015  . History of hepatitis C 09/16/2015   Prior treatment with Harvoni   . Hyperlipidemia 09/16/2015  . Hypertensive heart disease without CHF 09/16/2015   Assessment: 68 year old male s/p HM-3 implant with aortic valve closure 10/26. Patient has been progressing well without any bleeding complications. Ok per surgery to start low dose warfarin tonight. Patient completing post-op abx including fluconazole/rifampin last dose today, cefuroxime/vancomycin through tomorrow.   No other drug interactions identified.  INR 1.3 Hgb 8.8 LDH  309  Goal of Therapy:  INR goal 2-2.5 Monitor platelets by anticoagulation protocol: Yes   Plan:  Warfarin 2mg  tonight Daily INR Decrease aspirin to 81mg  when INR closer to 2  Erin Hearing PharmD., BCPS Clinical Pharmacist 11/18/2018 7:01 PM

## 2018-11-18 NOTE — Progress Notes (Signed)
Nutrition Follow-up  RD working remotely.  DOCUMENTATION CODES:   Severe malnutrition in context of chronic illness  INTERVENTION:   -Once diet advanced, resume Ensure Enlive poTID, each supplement provides 350 kcal and 20 grams of protein  - Once diet advanced, resumeMagic cup TID with meals, each supplement provides 290 kcal and 9 grams of protein  NUTRITION DIAGNOSIS:   Severe Malnutrition related to chronic illness (CHF, COPD) as evidenced by severe fat depletion, severe muscle depletion, percent weight loss (19.5% weight loss in less than 8 months).  Ongoing  GOAL:   Patient will meet greater than or equal to 90% of their needs  Unmet at this time as pt is NPO  MONITOR:   PO intake, Supplement acceptance, Labs, Weight trends, I & O's  REASON FOR ASSESSMENT:   Consult LVAD Eval  ASSESSMENT:   68 year old male who presented to the ED on 10/12 with SOB. PMH of CHF, HLD, CAD, COPD, HTN, CKD stage III, hepatitis C, tobacco use, cirrhosis, polysubstance abuse.  10/15 - s/p right heart cath 10/20 - s/p right/left heart cath 10/26 - s/p LVAD 10/27 - extubated  Pt extubated this AM and is NPO at this time. RD will monitor for diet advancement to reorder supplements.  Weight up 24 lbs after surgery. Suspect weight gain related to fluid status. EDW: 63.9 kg. Per RN edema assessment, pt with non-pitting generalized edema.  Meal Completion: 60-100% x last 8 recorded meals  Medications reviewed and include: Dulcolax, Colace, SSI q 4 hours, Protonix, IV abx, Epinephrine, Milrinone IVF: 1/2NS @ 20 ml/hr, LR @ 100 ml/hr  Labs reviewed: hemoglobin 9.9, potassium 5.2 CBG's: 121-139 x 12 hours  UOP: 4783 ml x 24 hours CT: 1210 ml x 24 hours I/O's: -15.6 L since admit  Diet Order:   Diet Order    None      EDUCATION NEEDS:   Education needs have been addressed  Skin:  Skin Assessment: Skin Integrity Issues: Skin Integrity Issues: Incisions: closed  chest  Last BM:  11/15/18  Height:   Ht Readings from Last 1 Encounters:  11/04/18 5\' 11"  (1.803 m)    Weight:   Wt Readings from Last 1 Encounters:  11/18/18 78.8 kg    Ideal Body Weight:  78.2 kg  BMI:  Body mass index is 24.23 kg/m.  Estimated Nutritional Needs:   Kcal:  2000-2200  Protein:  90-110 grams  Fluid:  >/= 1.8 L    Gaynell Face, MS, RD, LDN Inpatient Clinical Dietitian Pager: [phone] Weekend/After Hours: [phone]

## 2018-11-18 NOTE — Progress Notes (Signed)
CSW met in the waiting room with patient's wife and daughter. They were relived the surgery was over and looking forward to seeing patient. CSW explained ICU stay and that patient will still be on ventilator. Family grateful for the support of the team and plan to visit with patient briefly and go home for the evening. CSW provided supportive intervention and will continue to follow throughout implant hospitalization. Raquel Sarna, [place], [place]

## 2018-11-18 NOTE — Procedures (Signed)
Extubation Procedure Note  Patient Details:   Name: Keith Nguyen. DOB: July 28, 1950 MRN: [id number]   Airway Documentation:    Vent end date: 11/18/18 Vent end time: 1000   Evaluation  O2 sats: stable throughout Complications: No apparent complications Patient did tolerate procedure well. Bilateral Breath Sounds: Clear, Diminished   Yes   Leak test positive. No stridor noted.  Biviana Saddler 11/18/2018, 10:15 AM

## 2018-11-19 ENCOUNTER — Encounter (HOSPITAL_COMMUNITY): Payer: Self-pay | Admitting: Cardiothoracic Surgery

## 2018-11-19 DIAGNOSIS — Z95811 Presence of heart assist device: Secondary | ICD-10-CM | POA: Diagnosis not present

## 2018-11-19 DIAGNOSIS — I5023 Acute on chronic systolic (congestive) heart failure: Secondary | ICD-10-CM | POA: Diagnosis not present

## 2018-11-19 DIAGNOSIS — I5021 Acute systolic (congestive) heart failure: Secondary | ICD-10-CM | POA: Diagnosis not present

## 2018-11-19 LAB — CBC WITH DIFFERENTIAL/PLATELET
Abs Immature Granulocytes: 0.26 10*3/uL — ABNORMAL HIGH (ref 0.00–0.07)
Basophils Absolute: 0 10*3/uL (ref 0.0–0.1)
Basophils Relative: 0 %
Eosinophils Absolute: 0.2 10*3/uL (ref 0.0–0.5)
Eosinophils Relative: 1 %
HCT: 25.1 % — ABNORMAL LOW (ref 39.0–52.0)
Hemoglobin: 8.4 g/dL — ABNORMAL LOW (ref 13.0–17.0)
Immature Granulocytes: 2 %
Lymphocytes Relative: 5 %
Lymphs Abs: 0.8 10*3/uL (ref 0.7–4.0)
MCH: 30.7 pg (ref 26.0–34.0)
MCHC: 33.5 g/dL (ref 30.0–36.0)
MCV: 91.6 fL (ref 80.0–100.0)
Monocytes Absolute: 1 10*3/uL (ref 0.1–1.0)
Monocytes Relative: 6 %
Neutro Abs: 14.2 10*3/uL — ABNORMAL HIGH (ref 1.7–7.7)
Neutrophils Relative %: 86 %
Platelets: 91 10*3/uL — ABNORMAL LOW (ref 150–400)
RBC: 2.74 MIL/uL — ABNORMAL LOW (ref 4.22–5.81)
RDW: 16.2 % — ABNORMAL HIGH (ref 11.5–15.5)
WBC: 16.5 10*3/uL — ABNORMAL HIGH (ref 4.0–10.5)
nRBC: 0 % (ref 0.0–0.2)

## 2018-11-19 LAB — COMPREHENSIVE METABOLIC PANEL
ALT: 19 U/L (ref 0–44)
AST: 96 U/L — ABNORMAL HIGH (ref 15–41)
Albumin: 3.1 g/dL — ABNORMAL LOW (ref 3.5–5.0)
Alkaline Phosphatase: 46 U/L (ref 38–126)
Anion gap: 7 (ref 5–15)
BUN: 13 mg/dL (ref 8–23)
CO2: 22 mmol/L (ref 22–32)
Calcium: 8.9 mg/dL (ref 8.9–10.3)
Chloride: 103 mmol/L (ref 98–111)
Creatinine, Ser: 1.04 mg/dL (ref 0.61–1.24)
GFR calc Af Amer: >60 mL/min (ref >60)
GFR calc non Af Amer: >60 mL/min (ref >60)
Glucose, Bld: 99 mg/dL (ref 70–99)
Potassium: 4.3 mmol/L (ref 3.5–5.1)
Sodium: 132 mmol/L — ABNORMAL LOW (ref 135–145)
Total Bilirubin: 3.7 mg/dL — ABNORMAL HIGH (ref 0.3–1.2)
Total Protein: 5.8 g/dL — ABNORMAL LOW (ref 6.5–8.1)

## 2018-11-19 LAB — GLUCOSE, CAPILLARY
Glucose-Capillary: 86 mg/dL (ref 70–99)
Glucose-Capillary: 92 mg/dL (ref 70–99)
Glucose-Capillary: 95 mg/dL (ref 70–99)
Glucose-Capillary: 116 mg/dL — ABNORMAL HIGH (ref 70–99)
Glucose-Capillary: 103 mg/dL — ABNORMAL HIGH (ref 70–99)
Glucose-Capillary: 118 mg/dL — ABNORMAL HIGH (ref 70–99)

## 2018-11-19 LAB — BPAM PLATELET PHERESIS
Blood Product Expiration Date: 202010292359
ISSUE DATE / TIME: 202010272013
Unit Type and Rh: 7300

## 2018-11-19 LAB — POCT I-STAT 7, (LYTES, BLD GAS, ICA,H+H)
Acid-base deficit: 3 mmol/L — ABNORMAL HIGH (ref 0.0–2.0)
Bicarbonate: 21.4 mmol/L (ref 20.0–28.0)
Calcium, Ion: 1.27 mmol/L (ref 1.15–1.40)
HCT: 27 % — ABNORMAL LOW (ref 39.0–52.0)
Hemoglobin: 9.2 g/dL — ABNORMAL LOW (ref 13.0–17.0)
O2 Saturation: 97 %
Potassium: 4.4 mmol/L (ref 3.5–5.1)
Sodium: 134 mmol/L — ABNORMAL LOW (ref 135–145)
TCO2: 22 mmol/L (ref 22–32)
pCO2 arterial: 34.8 mmHg (ref 32.0–48.0)
pH, Arterial: 7.397 (ref 7.350–7.450)
pO2, Arterial: 95 mmHg (ref 83.0–108.0)

## 2018-11-19 LAB — COOXEMETRY PANEL
Carboxyhemoglobin: 1.5 % (ref 0.5–1.5)
Carboxyhemoglobin: 2 % — ABNORMAL HIGH (ref 0.5–1.5)
Methemoglobin: 0.5 % (ref 0.0–1.5)
Methemoglobin: 1.2 % (ref 0.0–1.5)
O2 Saturation: 98.1 %
O2 Saturation: 69.1 %
Total hemoglobin: 12.1 g/dL (ref 12.0–16.0)
Total hemoglobin: 8.3 g/dL — ABNORMAL LOW (ref 12.0–16.0)

## 2018-11-19 LAB — CALCIUM, IONIZED: Calcium, Ionized, Serum: 4.3 mg/dL — ABNORMAL LOW (ref 4.5–5.6)

## 2018-11-19 LAB — PROTIME-INR
INR: 1.4 — ABNORMAL HIGH (ref 0.8–1.2)
INR: 1.5 — ABNORMAL HIGH (ref 0.8–1.2)
Prothrombin Time: 17 seconds — ABNORMAL HIGH (ref 11.4–15.2)
Prothrombin Time: 17.8 seconds — ABNORMAL HIGH (ref 11.4–15.2)

## 2018-11-19 LAB — PREPARE PLATELET PHERESIS: Unit division: 0

## 2018-11-19 LAB — PHOSPHORUS: Phosphorus: 3.3 mg/dL (ref 2.5–4.6)

## 2018-11-19 LAB — MAGNESIUM: Magnesium: 1.8 mg/dL (ref 1.7–2.4)

## 2018-11-19 LAB — LACTATE DEHYDROGENASE: LDH: 369 U/L — ABNORMAL HIGH (ref 98–192)

## 2018-11-19 MED ORDER — AMIODARONE HCL 200 MG PO TABS
400.0000 mg | ORAL_TABLET | Freq: Two times a day (BID) | ORAL | Status: DC
  Administered 2018-11-19 (×2): 400 mg via ORAL
  Filled 2018-11-19 (×2): qty 2

## 2018-11-19 MED ORDER — COLCHICINE 0.6 MG PO TABS
0.6000 mg | ORAL_TABLET | Freq: Every day | ORAL | Status: DC
  Administered 2018-11-19 – 2018-11-23 (×5): 0.6 mg via ORAL
  Filled 2018-11-19 (×5): qty 1

## 2018-11-19 MED ORDER — MAGNESIUM SULFATE 2 GM/50ML IV SOLN
2.0000 g | Freq: Once | INTRAVENOUS | Status: AC
  Administered 2018-11-19: 13:00:00 2 g via INTRAVENOUS
  Filled 2018-11-19: qty 50

## 2018-11-19 MED ORDER — ENSURE ENLIVE PO LIQD
237.0000 mL | Freq: Three times a day (TID) | ORAL | Status: DC
  Administered 2018-11-19 – 2018-11-23 (×11): 237 mL via ORAL

## 2018-11-19 MED ORDER — BOOST / RESOURCE BREEZE PO LIQD CUSTOM
1.0000 | Freq: Three times a day (TID) | ORAL | Status: DC
  Administered 2018-11-19 (×2): 1 via ORAL

## 2018-11-19 MED ORDER — WARFARIN SODIUM 2 MG PO TABS
2.0000 mg | ORAL_TABLET | Freq: Once | ORAL | Status: AC
  Administered 2018-11-19: 2 mg via ORAL
  Filled 2018-11-19: qty 1

## 2018-11-19 MED ORDER — MILRINONE LACTATE IN DEXTROSE 20-5 MG/100ML-% IV SOLN
0.1250 ug/kg/min | INTRAVENOUS | Status: DC
  Administered 2018-11-19: 14:00:00 0.125 ug/kg/min via INTRAVENOUS

## 2018-11-19 MED ORDER — FUROSEMIDE 10 MG/ML IJ SOLN
40.0000 mg | Freq: Two times a day (BID) | INTRAMUSCULAR | Status: DC
  Administered 2018-11-19 – 2018-11-21 (×5): 40 mg via INTRAVENOUS
  Filled 2018-11-19 (×5): qty 4

## 2018-11-19 NOTE — Progress Notes (Signed)
Patient ID: Keith Nguyen., male   DOB: 1950-07-18, 68 y.o.   MRN: [id number]   Advanced Heart Failure VAD Team Note  PCP-Cardiologist: No primary care provider on file.   Subjective:    Post Op Day 1 HMIII LVAD Aortic valve closure.   Episode of atrial fibrillation/RVR last night, now appears to be in sinus tachycardia (most likely).   He is now off epinephrine, milrinone decreased to 0.125 mcg/kg/min.   Extubated, awake.  Mild soreness in chest.   Swan #s CVP 11-12 PA 38/22 CI 2.8  LVAD INTERROGATION:  HeartMate III LVAD:   Flow 4.3 liters/min, speed 5400, power 3.9, PI 4.1.  Decreased PI events overnight.    Objective:    Vital Signs:   Temp:  [99 F (37.2 C)-100.6 F (38.1 C)] 99.9 F (37.7 C) (10/28 0600) Pulse Rate:  [29-121] 121 (10/28 0742) Resp:  [17-43] 28 (10/28 0742) BP: (74-140)/(38-94) 109/75 (10/28 0600) SpO2:  [95 %-100 %] 99 % (10/28 0742) Arterial Line BP: (54-109)/(41-81) 102/81 (10/28 0600) FiO2 (%):  [40 %] 40 % (10/27 0900) Weight:  [77.5 kg] 77.5 kg (10/28 0500) Last BM Date: 11/15/18 Mean arterial Pressure 82  Intake/Output:   Intake/Output Summary (Last 24 hours) at 11/19/2018 0837 Last data filed at 11/19/2018 0600 Gross per 24 hour  Intake 3446.59 ml  Output 3545 ml  Net -98.41 ml     Physical Exam   CVP 11-12 General: Well appearing this am. NAD.  HEENT: Normal. Neck: Supple, JVP 10 cm. Carotids OK.  Cardiac:  Mechanical heart sounds with LVAD hum present.  Lungs:  Decreased at bases.   Abdomen:  NT, ND, no HSM. No bruits or masses. +BS  LVAD exit site: Well-healed and incorporated. Dressing dry and intact. No erythema or drainage. Stabilization device present and accurately applied. Driveline dressing changed daily per sterile technique. Extremities:  Warm and dry. No cyanosis, clubbing, rash, or edema.  Neuro:  Alert & oriented x 3. Cranial nerves grossly intact. Moves all 4 extremities w/o difficulty. Affect pleasant      Telemetry   ?Sinus tachy 110s, had atrial fibrillation episode last night.  Personally reviewed.   EKG   Difficult, probably sinus tachycardia.   Labs   Basic Metabolic Panel: Recent Labs  Lab 11/10/2018 1335  10/25/2018 1944  11/18/18 0256  11/18/18 1104 11/18/18 1556 11/18/18 1605 11/19/18 0305 11/19/18 0317  NA 135   < > 135   < > 134*   < > 136 134* 135 132* 134*  K 4.7   < > 5.2*   < > 5.2*   < > 4.5 4.4 4.4 4.3 4.4  CL 105  --  106  --  106  --   --  105  --  103  --   CO2 24  --  22  --  21*  --   --  21*  --  22  --   GLUCOSE 109*  --  134*  --  128*  --   --  116*  --  99  --   BUN 18  --  16  --  15  --   --  14  --  13  --   CREATININE 0.94  --  0.98  --  1.02  --   --  1.12  --  1.04  --   CALCIUM 7.9*  --  8.2*  --  8.1*  --   --  8.6*  --  8.9  --   MG 2.2  --  2.8*  --  2.2  --   --  1.9  --  1.8  --   PHOS  --   --   --   --  3.7  --   --   --   --  3.3  --    < > = values in this interval not displayed.    Liver Function Tests: Recent Labs  Lab 11/16/18 2146 11/18/18 0256 11/19/18 0305  AST 54* 120* 96*  ALT '31 18 19  ' ALKPHOS 66 33* 46  BILITOT 0.7 2.6* 3.7*  PROT 7.2 5.1* 5.8*  ALBUMIN 2.8* 3.2* 3.1*   No results for input(s): LIPASE, AMYLASE in the last 168 hours. No results for input(s): AMMONIA in the last 168 hours.  CBC: Recent Labs  Lab 11/14/18 0413 11/15/18 0339 11/16/18 0337  11/07/2018 1335  10/28/2018 1800 11/19/2018 1944  11/18/18 0256  11/18/18 1104 11/18/18 1556 11/18/18 1605 11/19/18 0305 11/19/18 0317  WBC 4.7 4.7 4.5   < > 12.6*  --   --  8.3  --  8.2  --   --  14.2*  --  16.5*  --   NEUTROABS 2.7 2.3 2.0  --   --   --   --   --   --  6.6  --   --   --   --  14.2*  --   HGB 14.7 15.1 14.7   < > 9.9*   < >  --  8.7*   < > 7.4*   < > 9.9* 8.3* 8.8* 8.4* 9.2*  HCT 44.5 45.2 43.1   < > 29.4*   < >  --  26.5*   < > 22.0*   < > 29.0* 24.4* 26.0* 25.1* 27.0*  MCV 89.4 88.6 88.5   < > 90.7  --   --  91.1  --  89.8  --   --  90.7   --  91.6  --   PLT 167 185 209   < > 130*  --  123* 135*  --  119*  --   --  90*  --  91*  --    < > = values in this interval not displayed.    INR: Recent Labs  Lab 11/16/18 2146 11/16/2018 1335 10/27/2018 1800 11/18/18 0256  INR 1.0 1.3* 1.4* 1.3*    Other results:  EKG:    Imaging   Dg Chest 1 View  Result Date: 11/18/2018 CLINICAL DATA:  Recent extubation EXAM: CHEST  1 VIEW COMPARISON:  Film from earlier in the same day. FINDINGS: Swan-Ganz catheter is again identified. Endotracheal tube and gastric catheter have been removed in the interval. LVAD is again seen and stable. Cardiomegaly with tortuous thoracic aorta is noted. Mild right basilar atelectasis is seen new from the prior study. Left basilar atelectasis is again noted. Bilateral thoracostomy tubes are again seen. No pneumothorax is noted. IMPRESSION: No evidence of pneumothorax. Bibasilar atelectatic changes. Electronically Signed   By: Inez Catalina M.D.   On: 11/18/2018 20:29   Dg Chest Port 1 View  Result Date: 11/18/2018 CLINICAL DATA:  Left ventricular assist device. EXAM: PORTABLE CHEST 1 VIEW COMPARISON:  November 17, 2018. FINDINGS: Stable cardiomediastinal silhouette. Endotracheal and nasogastric tubes are unchanged in position. Right internal jugular Swan-Ganz catheter is noted with tip directed toward right pulmonary artery. Left ventricular assist device is unchanged position. Left-sided chest tube is noted  without pneumothorax. Right lung is clear. Mild left basilar atelectasis is noted. Bony thorax is unremarkable. IMPRESSION: Stable left-sided chest tube without pneumothorax. Stable support apparatus. Stable left basilar subsegmental atelectasis. Electronically Signed   By: Marijo Conception M.D.   On: 11/18/2018 07:10   Dg Chest Port 1 View  Result Date: 11/04/2018 CLINICAL DATA:  Post LVAD EXAM: PORTABLE CHEST 1 VIEW COMPARISON:  Portable exam 1430 hours compared to 11/16/2018 FINDINGS: Tip of endotracheal  tube projects 3.6 cm above carina. Tip of nasogastric tube projects over distal esophagus; recommend advancing tube 12 cm to place proximal side-port at expected position of proximal stomach. RIGHT jugular Swan-Ganz catheter with tip projecting over central RIGHT pulmonary artery. Mediastinal drain present. LVAD projects over cardiac apex. Epicardial pacing wires are present. Enlargement of cardiac silhouette. Atherosclerotic calcification aorta. Bibasilar atelectasis greater on LEFT. Minimal bullous disease at LEFT apex. No acute infiltrate or pneumothorax. IMPRESSION: Postsurgical changes as above. Recommend advancing nasogastric tube 12 cm. Electronically Signed   By: Lavonia Dana M.D.   On: 10/27/2018 14:54   Dg Abd Portable 1v  Result Date: 11/08/2018 CLINICAL DATA:  OG tube placement EXAM: PORTABLE ABDOMEN - 1 VIEW COMPARISON:  None FINDINGS: The enteric tube appears to project over the gastric body/fundus. There is and LVAD in place. The bowel gas pattern is nonspecific and nonobstructive. IMPRESSION: Enteric tube projects over the gastric body/fundus. Electronically Signed   By: Constance Holster M.D.   On: 11/13/2018 17:08     Medications:     Scheduled Medications: . sodium chloride   Intravenous Once  . acetaminophen  1,000 mg Oral Q6H   Or  . acetaminophen (TYLENOL) oral liquid 160 mg/5 mL  1,000 mg Per Tube Q6H  . allopurinol  100 mg Oral Daily  . amiodarone  400 mg Oral BID  . aspirin EC  325 mg Oral Daily   Or  . aspirin  324 mg Per Tube Daily   Or  . aspirin  300 mg Rectal Daily  . bisacodyl  10 mg Oral Daily   Or  . bisacodyl  10 mg Rectal Daily  . chlorhexidine gluconate (MEDLINE KIT)  15 mL Mouth Rinse BID  . Chlorhexidine Gluconate Cloth  6 each Topical Daily  . colchicine  0.6 mg Oral Daily  . docusate sodium  200 mg Oral Daily  . furosemide  40 mg Intravenous BID  . insulin aspart  0-24 Units Subcutaneous Q4H  . loratadine  10 mg Oral Daily  . mouth rinse  15  mL Mouth Rinse BID  . pantoprazole  40 mg Oral Daily  . sodium chloride flush  10-40 mL Intracatheter Q12H  . sodium chloride flush  3 mL Intravenous Q12H  . sodium chloride flush  3 mL Intravenous Q12H  . sodium chloride flush  3 mL Intravenous Q12H  . sodium chloride flush  3 mL Intravenous Q12H  . Warfarin - Pharmacist Dosing Inpatient   Does not apply q1800    Infusions: . sodium chloride Stopped (11/18/18 1944)  . sodium chloride    . sodium chloride    . sodium chloride    . sodium chloride    . cefUROXime (ZINACEF)  IV Stopped (11/18/18 2155)  . dexmedetomidine (PRECEDEX) IV infusion Stopped (11/18/18 1001)  . epinephrine Stopped (11/18/18 1828)  . lactated ringers 20 mL/hr at 11/19/18 0600  . lactated ringers Stopped (11/20/2018 1610)  . milrinone 0.25 mcg/kg/min (11/19/18 0600)  . milrinone 0.125 mcg/kg/min (11/19/18 0814)  .  norepinephrine (LEVOPHED) Adult infusion 1 mcg/min (10/26/2018 2128)  . vancomycin Stopped (11/18/18 2044)    PRN Medications: sodium chloride, sodium chloride, acetaminophen, albuterol, bisacodyl, bisacodyl, hydrALAZINE, methocarbamol, morphine injection, ondansetron (ZOFRAN) IV, ondansetron **OR** [DISCONTINUED] ondansetron (ZOFRAN) IV, oxyCODONE, sodium chloride, sodium chloride flush, sodium chloride flush, sodium chloride flush, traMADol     Assessment/Plan:     1. S/P HMIII DT: Extubated, off epinephrine with stable MAP.  Hgb down with expected blood loss. Received 1UPRBC 10/27. LDH 369.  - Continue ASA 325 until INR 2 then will drop to 81 mg daily.   - Warfarin begun. 2. Acute on chronic systolic CHF: Nonischemic cardiomyopathy diagnosed in 2017 with echo showing EF 40% and LHC showing mild nonobstructive CAD. He saw Dr. Wynonia Lawman in the past, but no cardiology evaluation since 2017. 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 He has drunk moderate ETOH in the past (no longer drinks) but does not seem to have drunk enough to cause a cardiomyopathy. He was markedly volume overloaded on exam initially with biventricular failure and NYHA class IV at admission with rise in creatinine concerning for cardiorenal syndrome. Initial co-ox 40% suggested low CO, milrinone started and increased to 0.375.    RHC showed R>L heart failure.  Cardiac output looked good on milrinone by Fick but was low by thermodilution (discordant values).  Repeat LHC/RHC showed very low filling pressures and good cardiac index by Fick.  No significant coronary disease noted.  S/P HMIII LVAD as above. CVP 11-12, CI 2.8 today.  - Decrease milrinone 0.125 mcg/kg/min.  - Lasix 40 mg IV bid today.  2. AKI on CKD stage 3: Suspect this may be a combination of cardiorenal syndrome and contrast nephropathy (had CTA chest at admission). Minimal contrast with coronary angiography.  Creatinine stable today at 1.04. 3. 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 He was drinking moderate ETOH as well but has quit completely for about 1 month. Childs A per GI.  4. COPD/smoking: He recently quit smoking.CT chest with moderate emphysema. He is now off oxygen. Minimal obstruction on PFTs. 5. Atrial fibrillation: Transient overnight.  - Amiodarone 400 mg po bid to start.  6. Hyponatremia:  -Resolved.  7. Aortic insufficiency: ?Moderate range. AV closure with LVAD 11/04/2018   CRITICAL CARE Performed by: Loralie Champagne  Total critical care time: 35 minutes  Critical care time was exclusive of separately billable procedures and treating other patients.  Critical care was necessary to treat or prevent imminent or life-threatening deterioration.  Critical care was time spent personally by me on the following activities: development of treatment plan with patient and/or surrogate as well  as nursing, discussions with consultants, evaluation of patient's response to treatment, examination of patient, obtaining history from patient or surrogate, ordering and performing treatments and interventions, ordering and review of laboratory studies, ordering and review of radiographic studies, pulse oximetry and re-evaluation of patient's condition.   I reviewed the LVAD parameters from today, and compared the results to the patient's prior recorded data.  No programming changes were made.  The LVAD is functioning within specified parameters.  The patient performs LVAD self-test daily.  LVAD interrogation was negative for any significant power changes, alarms or PI events/speed drops.  LVAD equipment check completed and is in good working order.  Back-up equipment present.  LVAD education done on emergency procedures and precautions and reviewed exit site care.  Length of Stay: [place], [place] 11/19/2018, 8:37 AM  VAD Team --- VAD ISSUES ONLY--- Pager [phone] (7am - 7am)  Advanced Heart Failure Team  Pager [phone] (M-F; 7a - 4p)  Please contact [facility] for night-coverage after hours (4p -7a ) and weekends on [email or web address]

## 2018-11-19 NOTE — Evaluation (Signed)
Physical Therapy Evaluation Patient Details Name: Keith Nguyen. MRN: [id number] DOB: 08-Jul-1950 Today's Date: 11/19/2018   History of Present Illness  Pt is a 68 y.o. male admitted 10/31/2018 with SOB, worked up for CHF. S/p cardiac cath 10/15 and 10/20. S/p HM3 LVAD placement 10/26. PMH includes Hep C, HTN, HLD.    Clinical Impression  Pt presents with an overall decrease in functional mobility secondary to above. PTA, pt independent, lives with wife and daughter. Initiated education regarding sternal precautions, LVAD and importance of mobility. Today, pt able to ambulate short distance with eva walker and minA (assist +2 for lines); pt also participating in switch to/from battery power. Daughter present for education and very supportive. Expect pt to progress well with mobility. Pt would benefit from continued acute PT services to maximize functional mobility and independence prior to d/c home.     Follow Up Recommendations No PT follow up;Supervision for mobility/OOB(pending progression)    Equipment Recommendations  (TBD)    Recommendations for Other Services       Precautions / Restrictions Precautions Precautions: Sternal;Fall Precaution Comments: LVAD, multiple lines (chest tube, RIJ inducer, LUE art line...), 4L O2      Mobility  Bed Mobility               General bed mobility comments: Received sitting in recliner  Transfers Overall transfer level: Needs assistance Equipment used: 4-wheeled walker(eva walker) Transfers: Sit to/from Stand Sit to Stand: Min assist;+2 safety/equipment         General transfer comment: Cues for technique with sternal precautions, pt able to stand on second attempt with minA for steadying assist  Ambulation/Gait Ambulation/Gait assistance: Min assist;+2 safety/equipment Gait Distance (Feet): 40 Feet Assistive device: 4-wheeled walker(eva walker) Gait Pattern/deviations: Step-through pattern;Decreased stride length;Trunk  flexed Gait velocity: Decreased   General Gait Details: Slow gait with eva walker and minA for steadying assist, +2 assist for lines; cues for upright posture  Stairs            Wheelchair Mobility    Modified Rankin (Stroke Patients Only)       Balance Overall balance assessment: Needs assistance Sitting-balance support: Feet supported;No upper extremity supported Sitting balance-Leahy Scale: Fair       Standing balance-Leahy Scale: Poor Standing balance comment: Reliant on min guard to minA for static standing; BUE support for dynamic stability                             Pertinent Vitals/Pain Pain Assessment: Faces Faces Pain Scale: Hurts a little bit Pain Location: Generalized Pain Descriptors / Indicators: Discomfort Pain Intervention(s): Monitored during session    Home Living Family/patient expects to be discharged to:: Private residence Living Arrangements: Spouse/significant other;Children Available Help at Discharge: Family;Available 24 hours/day Type of Home: House Home Access: Level entry     Home Layout: One level Home Equipment: None Additional Comments: Pt and wife recently moved from [place], [place] and living locally with daughter. Daughter reports they can borrow DME as needed    Prior Function Level of Independence: Independent   Gait / Transfers Assistance Needed: Independent, drives.  ADL's / Homemaking Assistance Needed: Does his own ADLs        Hand Dominance        Extremity/Trunk Assessment   Upper Extremity Assessment Upper Extremity Assessment: Generalized weakness(bilateral hand swelling post-op)    Lower Extremity Assessment Lower Extremity Assessment: Generalized weakness  Communication      Cognition Arousal/Alertness: Awake/alert Behavior During Therapy: Flat affect Overall Cognitive Status: Within Functional Limits for tasks assessed                                         General Comments General comments (skin integrity, edema, etc.): OT initiated LVAD education and sternal precautions; switched to/from battery power with daughter present and supportive, pt already participating in this well    Exercises     Assessment/Plan    PT Assessment Patient needs continued PT services  PT Problem List Decreased strength;Decreased mobility;Decreased balance;Cardiopulmonary status limiting activity;Decreased activity tolerance;Decreased knowledge of use of DME       PT Treatment Interventions Therapeutic activities;Gait training;Therapeutic exercise;Patient/family education;Functional mobility training;Balance training;DME instruction;Stair training    PT Goals (Current goals can be found in the Care Plan section)  Acute Rehab PT Goals Patient Stated Goal: to get home PT Goal Formulation: With patient/family Time For Goal Achievement: 12/03/18 Potential to Achieve Goals: Good    Frequency Min 3X/week   Barriers to discharge        Co-evaluation               AM-PAC PT "6 Clicks" Mobility  Outcome Measure Help needed turning from your back to your side while in a flat bed without using bedrails?: A Little Help needed moving from lying on your back to sitting on the side of a flat bed without using bedrails?: A Little Help needed moving to and from a bed to a chair (including a wheelchair)?: A Little Help needed standing up from a chair using your arms (e.g., wheelchair or bedside chair)?: A Little Help needed to walk in hospital room?: A Little Help needed climbing 3-5 steps with a railing? : A Little 6 Click Score: 18    End of Session   Activity Tolerance: Patient tolerated treatment well Patient left: in chair;with call bell/phone within reach;with family/visitor present Nurse Communication: Mobility status PT Visit Diagnosis: Other abnormalities of gait and mobility (R26.89);Difficulty in walking, not elsewhere classified (R26.2)     Time: [id number] PT Time Calculation (min) (ACUTE ONLY): 43 min   Charges:   PT Evaluation $PT Eval Moderate Complexity: 1 Mod PT Treatments $Therapeutic Activity: 8-22 mins   Mabeline Caras, PT, DPT Acute Rehabilitation Services  Pager [phone] Office [place] 11/19/2018, 4:27 PM

## 2018-11-19 NOTE — Progress Notes (Signed)
ANTICOAGULATION CONSULT NOTE - Follow Up Consult  Pharmacy Consult for warfarin Indication: LVAD  Allergies  Allergen Reactions  . Lipitor [Atorvastatin]     Elevated liver enzymes    Patient Measurements: Height: 5\' 11"  (180.3 cm) Weight: 170 lb 13.7 oz (77.5 kg) IBW/kg (Calculated) : 75.3  Vital Signs: Temp: 99.7 F (37.6 C) (10/28 0800) Temp Source: Core (10/28 0400) BP: 90/64 (10/28 0800) Pulse Rate: 121 (10/28 0742)  Labs: Recent Labs    11/16/18 2146  11/19/2018 1335  11/09/2018 1800  11/18/18 0256  11/18/18 1556 11/18/18 1605 11/19/18 0305 11/19/18 0317 11/19/18 1012  HGB  --    < > 9.9*   < >  --    < > 7.4*   < > 8.3* 8.8* 8.4* 9.2*  --   HCT  --    < > 29.4*   < >  --    < > 22.0*   < > 24.4* 26.0* 25.1* 27.0*  --   PLT  --    < > 130*  --  123*   < > 119*  --  90*  --  91*  --   --   APTT 31  --  40*  --  45*  --   --   --   --   --   --   --   --   LABPROT 13.0  --  16.4*  --  16.6*  --  16.2*  --   --   --  17.0*  --  17.8*  INR 1.0  --  1.3*  --  1.4*  --  1.3*  --   --   --  1.4*  --  1.5*  CREATININE 1.20   < > 0.94  --   --    < > 1.02  --  1.12  --  1.04  --   --    < > = values in this interval not displayed.    Estimated Creatinine Clearance: 71.4 mL/min (by C-G formula based on SCr of 1.04 mg/dL).   Medical History: Past Medical History:  Diagnosis Date  . Abnormal cardiac function test 09/16/2015  . Aortic atherosclerosis ([place]) 09/16/2015  . History of hepatitis C 09/16/2015   Prior treatment with Harvoni   . Hyperlipidemia 09/16/2015  . Hypertensive heart disease without CHF 09/16/2015   Assessment: 68 year old male s/p HM-3 implant with aortic valve closure 10/26. Warfarin started on 10/27. Patient had episode of afib w/ RVR overnight and now on amiodarone 400 BID. Drinking Boost supplements as well - has had one today. Will need to watch INR closely.   INR today is 1.5, CBC stable, LDH 369.  Goal of Therapy:  INR goal 2-2.5 Monitor  platelets by anticoagulation protocol: Yes   Plan:  Warfarin 2mg  again tonight Daily INR Decrease aspirin to 81mg  when INR closer to 2  Vertis Kelch, PharmD PGY2 Cardiology Pharmacy Resident Phone [phone] 11/19/2018       11:52 AM  Please check [email or web address] for unit-specific pharmacist phone numbers

## 2018-11-19 NOTE — Progress Notes (Signed)
2 Days Post-Op Procedure(s) (LRB): INSERTION OF IMPLANTABLE LEFT VENTRICULAR ASSIST DEVICE (N/A) AORTIC VALVE REPAIR (N/A) TRANSESOPHAGEAL ECHOCARDIOGRAM (TEE) (N/A) Subjective: No complaints   Objective: Vital signs in last 24 hours: Temp:  [99 F (37.2 C)-100.6 F (38.1 C)] 99.9 F (37.7 C) (10/28 0600) Pulse Rate:  [29-121] 121 (10/28 0742) Cardiac Rhythm: Sinus tachycardia (10/28 0000) Resp:  [17-43] 28 (10/28 0742) BP: (74-140)/(38-94) 109/75 (10/28 0600) SpO2:  [95 %-100 %] 99 % (10/28 0742) Arterial Line BP: (54-109)/(41-81) 102/81 (10/28 0600) FiO2 (%):  [40 %] 40 % (10/27 0900) Weight:  [77.5 kg] 77.5 kg (10/28 0500)  Hemodynamic parameters for last 24 hours: PAP: (20-45)/(8-22) 42/21 CVP:  [0 mmHg-12 mmHg] 8 mmHg CO:  [4.7 L/min-6.6 L/min] 5.7 L/min CI:  [2.5 L/min/m2-3.6 L/min/m2] 3.1 L/min/m2  Intake/Output from previous day: 10/27 0701 - 10/28 0700 In: 3630.1 [P.O.:600; I.V.:1558.7; Blood:474; IV Piggyback:997.4] Out: HU:[id number]; Chest Tube:1240] Intake/Output this shift: No intake/output data recorded.  General appearance: alert and cooperative Neurologic: intact Heart: tachycardiac Lungs: clear to auscultation bilaterally Abdomen: soft, non-tender; bowel sounds normal; no masses,  no organomegaly Extremities: edema mild Wound: dressed, dry  Lab Results: Recent Labs    11/18/18 1556  11/19/18 0305 11/19/18 0317  WBC 14.2*  --  16.5*  --   HGB 8.3*   < > 8.4* 9.2*  HCT 24.4*   < > 25.1* 27.0*  PLT 90*  --  91*  --    < > = values in this interval not displayed.   BMET:  Recent Labs    11/18/18 1556  11/19/18 0305 11/19/18 0317  NA 134*   < > 132* 134*  K 4.4   < > 4.3 4.4  CL 105  --  103  --   CO2 21*  --  22  --   GLUCOSE 116*  --  99  --   BUN 14  --  13  --   CREATININE 1.12  --  1.04  --   CALCIUM 8.6*  --  8.9  --    < > = values in this interval not displayed.    PT/INR:  Recent Labs    11/18/18 0256  LABPROT 16.2*   INR 1.3*   ABG    Component Value Date/Time   PHART 7.397 11/19/2018 0317   HCO3 21.4 11/19/2018 0317   TCO2 22 11/19/2018 0317   ACIDBASEDEF 3.0 (H) 11/19/2018 0317   O2SAT 98.1 11/19/2018 0345   CBG (last 3)  Recent Labs    11/19/18 0037 11/19/18 0313 11/19/18 0842  GLUCAP 86 92 95    Assessment/Plan: S/P Procedure(s) (LRB): INSERTION OF IMPLANTABLE LEFT VENTRICULAR ASSIST DEVICE (N/A) AORTIC VALVE REPAIR (N/A) TRANSESOPHAGEAL ECHOCARDIOGRAM (TEE) (N/A) Mobilize Diuresis discontinue swan   LOS: 15 days    Wonda Olds 11/19/2018

## 2018-11-19 NOTE — Progress Notes (Signed)
RT note-Nitric turned off per Dr. Julien Girt

## 2018-11-19 NOTE — Progress Notes (Signed)
Patient ID: Keith Champigny., male   DOB: 1950-02-14, 68 y.o.   MRN: [id number] EVENING ROUNDS NOTE :     [address]       [place] [address]             [phone]                 2 Days Post-Op Procedure(s) (LRB): INSERTION OF IMPLANTABLE LEFT VENTRICULAR ASSIST DEVICE (N/A) AORTIC VALVE REPAIR (N/A) TRANSESOPHAGEAL ECHOCARDIOGRAM (TEE) (N/A)  Total Length of Stay:  LOS: 15 days  BP 92/77   Pulse (!) 121   Temp 98.3 F (36.8 C) (Oral)   Resp (!) 26   Ht 5\' 11"  (1.803 m)   Wt 77.5 kg   SpO2 99%   BMI 23.83 kg/m   .Intake/Output      10/27 0701 - 10/28 0700 10/28 0701 - 10/29 0700   P.O. 600    I.V. (mL/kg) 1558.7 (20.1) 152.3 (2)   Blood 474    Other     IV Piggyback 997.4 168.4   Total Intake(mL/kg) 3630.1 (46.8) 320.7 (4.1)   Urine (mL/kg/hr) 2465 (1.3) 765 (0.8)   Blood     Chest Tube 1240 115   Total Output 3705 880   Net -74.9 -559.3          . sodium chloride Stopped (11/18/18 1944)  . sodium chloride    . sodium chloride    . sodium chloride    . sodium chloride    . dexmedetomidine (PRECEDEX) IV infusion Stopped (11/18/18 1001)  . epinephrine Stopped (11/18/18 1828)  . lactated ringers Stopped (11/19/2018 1610)  . milrinone 0.125 mcg/kg/min (11/19/18 1329)  . milrinone 0.125 mcg/kg/min (11/19/18 1800)  . norepinephrine (LEVOPHED) Adult infusion 1 mcg/min (10/26/2018 2128)     Lab Results  Component Value Date   WBC 16.5 (H) 11/19/2018   HGB 9.2 (L) 11/19/2018   HCT 27.0 (L) 11/19/2018   PLT 91 (L) 11/19/2018   GLUCOSE 99 11/19/2018   CHOL 200 11/07/2018   TRIG 86 11/07/2018   HDL 36 (L) 11/07/2018   LDLCALC 147 (H) 11/07/2018   ALT 19 11/19/2018   AST 96 (H) 11/19/2018   NA 134 (L) 11/19/2018   K 4.4 11/19/2018   CL 103 11/19/2018   CREATININE 1.04 11/19/2018   BUN 13 11/19/2018   CO2 22 11/19/2018   TSH 10.048 (H) 11/04/2018   PSA 0.54 06/17/2009   INR 1.5 (H) 11/19/2018   HGBA1C 6.5 (H) 11/16/2018   MICROALBUR  0.51 06/17/2009   Alert, stable day   Grace Isaac MD  Beeper [phone] Office [phone] 11/19/2018 6:49 PM

## 2018-11-19 NOTE — Evaluation (Signed)
Occupational Therapy Evaluation Patient Details Name: Keith Nguyen. MRN: [id number] DOB: November 25, 1950 Today's Date: 11/19/2018    History of Present Illness Pt is a 68 y.o. male admitted 11/09/2018 with SOB, worked up for CHF. S/p cardiac cath 10/15 and 10/20. S/p HM3 LVAD placement 10/26. PMH includes Hep C, HTN, HLD.   Clinical Impression    PTA, pt was living with his wife and was independent; pt and wife now living with daughter for increased support. Pt currently requiring Mod-Max A for UB ADLs, Min-Mod A for LB ADLs, and Min A +2 for functional mobility with [place] walker. Pt presenting with decreased strength, balance, and activity tolerance. However, highly motivated to participate in therapy. Initiating education on LVAD management and compensatory techniques for ADLs. Pt's daughter also present throughout. Pt would benefit from further acute OT to facilitate safe dc. Recommend dc to home with HHOT for further OT to optimize safety, independence with ADLs, and return to PLOF.      Follow Up Recommendations  Home health OT;Supervision/Assistance - 24 hour(May progress to home without OT)    Equipment Recommendations  Tub/shower seat(Daughter may have access to DME)    Recommendations for Other Services PT consult     Precautions / Restrictions Precautions Precautions: Sternal;Fall Precaution Comments: LVAD, multiple lines (chest tube, RIJ inducer, LUE art line...), 4L O2 Restrictions Weight Bearing Restrictions: Yes      Mobility Bed Mobility               General bed mobility comments: Received sitting in recliner  Transfers Overall transfer level: Needs assistance Equipment used: 4-wheeled walker(eva walker) Transfers: Sit to/from Stand Sit to Stand: Min assist;+2 safety/equipment         General transfer comment: Cues for technique with sternal precautions, pt able to stand on second attempt with minA for steadying assist    Balance Overall balance  assessment: Needs assistance Sitting-balance support: Feet supported;No upper extremity supported Sitting balance-Leahy Scale: Fair     Standing balance support: During functional activity Standing balance-Leahy Scale: Poor Standing balance comment: Reliant on min guard to minA for static standing; BUE support for dynamic stability                           ADL either performed or assessed with clinical judgement   ADL Overall ADL's : Needs assistance/impaired Eating/Feeding: Set up;Supervision/ safety;Sitting   Grooming: Set up;Supervision/safety;Sitting   Upper Body Bathing: Moderate assistance;Sitting   Lower Body Bathing: Moderate assistance;Sit to/from stand   Upper Body Dressing : Maximal assistance;Sitting Upper Body Dressing Details (indicate cue type and reason): Initiating education on LVAD management. Pt requiring Max cues and assistance for changing LVAD from wall power to batteries.  Lower Body Dressing: Minimal assistance;Sit to/from stand Lower Body Dressing Details (indicate cue type and reason): Min A for balance in standing. Pt able to bring ankles to knees. Initating education on compensatory techniques for LB dressing. Toilet Transfer: Minimal assistance;Ambulation(simulated to recliner; eva walker)           Functional mobility during ADLs: Minimal assistance;+2 for physical assistance;+2 for safety/equipment(eva walker) General ADL Comments: Initating education on compensatory techniques for ADLs and LVAD management. Pt's daughter present and very invested in education. Pt demonstrating high motivation.      Vision         Perception     Praxis      Pertinent Vitals/Pain Pain Assessment: Faces Faces Pain Scale: Hurts a  little bit Pain Location: Generalized Pain Descriptors / Indicators: Discomfort Pain Intervention(s): Monitored during session;Limited activity within patient's tolerance;Repositioned     Hand Dominance Right    Extremity/Trunk Assessment Upper Extremity Assessment Upper Extremity Assessment: Generalized weakness   Lower Extremity Assessment Lower Extremity Assessment: Generalized weakness   Cervical / Trunk Assessment Cervical / Trunk Assessment: Normal   Communication Communication Communication: No difficulties   Cognition Arousal/Alertness: Awake/alert Behavior During Therapy: Flat affect Overall Cognitive Status: Within Functional Limits for tasks assessed                                 General Comments: seems [facility] for basic mobility tasks. Following cues and motivated to participate in therapy   General Comments  Pt's daughter present throughout session    Exercises     Shoulder [place] expects to be discharged to:: Private residence Living Arrangements: Spouse/significant other;Children Available Help at Discharge: Family;Available 24 hours/day Type of Home: House Home Access: Level entry     Home Layout: One level     Bathroom Shower/Tub: Teacher, early years/pre: Standard     Home Equipment: None   Additional Comments: Pt and wife recently moved from [place], [place] and living locally with daughter. Daughter reports they can borrow DME as needed      Prior Functioning/Environment Level of Independence: Independent  Gait / Transfers Assistance Needed: Independent, drives. ADL's / Homemaking Assistance Needed: Does his own ADLs   Comments: Pt and wife visiting from [facility], staying with daughter at d/c in [place] (above house setup daughter's house)        OT Problem List: Decreased strength;Decreased range of motion;Decreased activity tolerance;Impaired balance (sitting and/or standing);Decreased knowledge of use of DME or AE;Decreased knowledge of precautions      OT Treatment/Interventions: Self-care/ADL training;Therapeutic exercise;Energy conservation;DME and/or AE instruction;Therapeutic  activities;Patient/family education    OT Goals(Current goals can be found in the care plan section) Acute Rehab OT Goals Patient Stated Goal: to get home OT Goal Formulation: With patient/family Time For Goal Achievement: 12/03/18 Potential to Achieve Goals: Good  OT Frequency: Min 3X/week   Barriers to D/C:            Co-evaluation              AM-PAC OT "6 Clicks" Daily Activity     Outcome Measure Help from another person eating meals?: None Help from another person taking care of personal grooming?: A Little Help from another person toileting, which includes using toliet, bedpan, or urinal?: A Little Help from another person bathing (including washing, rinsing, drying)?: A Lot Help from another person to put on and taking off regular upper body clothing?: A Lot Help from another person to put on and taking off regular lower body clothing?: A Little 6 Click Score: 17   End of Session Equipment Utilized During Treatment: Other (comment);Oxygen(Eva walker) Nurse Communication: Mobility status  Activity Tolerance: Patient tolerated treatment well Patient left: in chair;with call bell/phone within reach  OT Visit Diagnosis: Unsteadiness on feet (R26.81);Other abnormalities of gait and mobility (R26.89);Muscle weakness (generalized) (M62.81)                Time: [id number] OT Time Calculation (min): 40 min Charges:  OT General Charges $OT Visit: 1 Visit OT Evaluation $OT Eval Moderate Complexity: 1 Mod  Everton Bertha MSOT, OTR/L Acute Rehab Pager:  [phone] Office: [place] 11/19/2018, 5:02 PM

## 2018-11-19 NOTE — Progress Notes (Signed)
Patient went into a.fib rhythm around 21:40, ECG obtained by bedside RN. Patient's HR and blood pressure decreased and index on VAD went down to 1.6 from 4. Dr. Cyndia Bent paged and made aware of situation.   Patient converted out of a.fib and went into sinus tach. Dr. Cyndia Bent made aware. Verbal orders to start amio if patient goes back into a.fib. RN has not needed to start amio at this time.   Will continue to monitor patient.   Keith Nguyen E Reola Mosher, [place]

## 2018-11-19 NOTE — Progress Notes (Signed)
CSW met at bedside with patient and daughter. Patient sitting up in the chair and states he feels "good". Daughter pleased with patient's progress and she encouraged patient during CSW visit in his recovery. CSW provided supportive intervention and will continue to follow throughout implant hospitalization. Raquel Sarna, [place], [place]

## 2018-11-19 NOTE — Progress Notes (Signed)
LVAD Coordinator Rounding Note:  Admitted 11/11/2018 due to CHF excerbation.   HM3 LVAD implanted on 10/26/2018 by Dr. Orvan Seen under DT criteria.  Pt sitting up in bed states he had a good night and has minimal pain.  Vital signs: Temp: 99.7 HR: 120 Doppler Pressure: not done Automatic BP: 90/64 (73) O2 Sat: 99 on 2 L/[place] Wt: 173.7>170.8lbs     LVAD interrogation reveals:   Speed: 5400 Flow: 4.3 Power:  3.9 PI: 4.1 Alarms: none  Events:  1 overnight Hematocrit: 27 Fixed speed: 5400 Low speed limit: 5100   Drive Line:  Existing VAD dressing removed and site care performed using sterile technique. Drive line exit site cleaned with Chlora prep applicators x 2, allowed to dry, and gauze dressing with silver strip re-applied. Exit site with small amount of dried blood, the velour is fully implanted at exit site. No redness, tenderness, drainage, foul odor or rash noted. Drive line anchor secure.  Daily dressing changes using daily kits by VAD Coordinator or nurse champion.    Labs:  LDH trend: [id number]  INR trend: 1.3>1.04  Anticoagulation Plan: -INR Goal: 2-2.5 -ASA Dose: 325 until INR is therapeutic  Blood Products:  -intra-op: 11/04/2018 -2 u PLTs -4 u FFP  Post op: 10/27-20- 1 u PRBCs   Device: -N/A  Respiratory: 2L/[place] nitric off today 11/19/18  Gtts: Precedex 0.1 mcg/hr-off 10/27 Epi 2.5 mcg/min-off 10/27 Milrinone 0.125 mcg/kg/min  Renal:  -BUN/CRT: 15/1.02>13/1.04  Adverse Events on VAD: -  VAD education:  1. Meeting with daughter this afternoon to go over driveline dressing change.   Plan/Recommendations:   1. Daily dressing changes by VAD coordinator or nurse champion.  2. Page VAD coordinator with equipment issues or driveline questions.  Tanda Rockers RN, BSN VAD Coordinator 24/7 Pager [phone]

## 2018-11-19 NOTE — Progress Notes (Signed)
Unable to obtain SPO2 reading from the pulse oximeter. Used a portable pulse oximeter to perform spot checks throughout the day.

## 2018-11-20 ENCOUNTER — Inpatient Hospital Stay: Payer: Self-pay

## 2018-11-20 ENCOUNTER — Inpatient Hospital Stay (HOSPITAL_COMMUNITY): Payer: 59

## 2018-11-20 DIAGNOSIS — I5023 Acute on chronic systolic (congestive) heart failure: Secondary | ICD-10-CM | POA: Diagnosis not present

## 2018-11-20 DIAGNOSIS — I5021 Acute systolic (congestive) heart failure: Secondary | ICD-10-CM | POA: Diagnosis not present

## 2018-11-20 DIAGNOSIS — Z95811 Presence of heart assist device: Secondary | ICD-10-CM | POA: Diagnosis not present

## 2018-11-20 LAB — GLUCOSE, CAPILLARY
Glucose-Capillary: 102 mg/dL — ABNORMAL HIGH (ref 70–99)
Glucose-Capillary: 104 mg/dL — ABNORMAL HIGH (ref 70–99)
Glucose-Capillary: 112 mg/dL — ABNORMAL HIGH (ref 70–99)
Glucose-Capillary: 118 mg/dL — ABNORMAL HIGH (ref 70–99)
Glucose-Capillary: 126 mg/dL — ABNORMAL HIGH (ref 70–99)
Glucose-Capillary: 126 mg/dL — ABNORMAL HIGH (ref 70–99)
Glucose-Capillary: 99 mg/dL (ref 70–99)

## 2018-11-20 LAB — BLOOD GAS, ARTERIAL
Acid-Base Excess: 3 mmol/L — ABNORMAL HIGH (ref 0.0–2.0)
Bicarbonate: 27.5 mmol/L (ref 20.0–28.0)
O2 Saturation: 65.6 %
Patient temperature: 37
pCO2 arterial: 45.7 mmHg (ref 32.0–48.0)
pH, Arterial: 7.397 (ref 7.350–7.450)
pO2, Arterial: 46.2 mmHg — ABNORMAL LOW (ref 83.0–108.0)

## 2018-11-20 LAB — COOXEMETRY PANEL
Carboxyhemoglobin: 1.2 % (ref 0.5–1.5)
Methemoglobin: 0.9 % (ref 0.0–1.5)
O2 Saturation: 61 %
Total hemoglobin: 12.7 g/dL (ref 12.0–16.0)

## 2018-11-20 LAB — BPAM RBC
Blood Product Expiration Date: 202011262359
Blood Product Expiration Date: 202011262359
Blood Product Expiration Date: 202011282359
Blood Product Expiration Date: 202011282359
Blood Product Expiration Date: 202011282359
Blood Product Expiration Date: 202011282359
ISSUE DATE / TIME: 202010260916
ISSUE DATE / TIME: 202010260916
ISSUE DATE / TIME: 202010260916
ISSUE DATE / TIME: 202010260916
ISSUE DATE / TIME: 202010270427
Unit Type and Rh: 5100
Unit Type and Rh: 5100
Unit Type and Rh: 5100
Unit Type and Rh: 5100
Unit Type and Rh: 5100
Unit Type and Rh: 5100

## 2018-11-20 LAB — TYPE AND SCREEN
ABO/RH(D): O POS
Antibody Screen: NEGATIVE
Unit division: 0
Unit division: 0
Unit division: 0
Unit division: 0
Unit division: 0
Unit division: 0

## 2018-11-20 LAB — CBC WITH DIFFERENTIAL/PLATELET
Abs Immature Granulocytes: 0.1 10*3/uL — ABNORMAL HIGH (ref 0.00–0.07)
Basophils Absolute: 0 10*3/uL (ref 0.0–0.1)
Basophils Relative: 0 %
Eosinophils Absolute: 0.2 10*3/uL (ref 0.0–0.5)
Eosinophils Relative: 1 %
HCT: 24.7 % — ABNORMAL LOW (ref 39.0–52.0)
Hemoglobin: 8.4 g/dL — ABNORMAL LOW (ref 13.0–17.0)
Immature Granulocytes: 1 %
Lymphocytes Relative: 8 %
Lymphs Abs: 1.2 10*3/uL (ref 0.7–4.0)
MCH: 31.1 pg (ref 26.0–34.0)
MCHC: 34 g/dL (ref 30.0–36.0)
MCV: 91.5 fL (ref 80.0–100.0)
Monocytes Absolute: 0.9 10*3/uL (ref 0.1–1.0)
Monocytes Relative: 6 %
Neutro Abs: 12.7 10*3/uL — ABNORMAL HIGH (ref 1.7–7.7)
Neutrophils Relative %: 84 %
Platelets: 104 10*3/uL — ABNORMAL LOW (ref 150–400)
RBC: 2.7 MIL/uL — ABNORMAL LOW (ref 4.22–5.81)
RDW: 16.6 % — ABNORMAL HIGH (ref 11.5–15.5)
WBC: 15.1 10*3/uL — ABNORMAL HIGH (ref 4.0–10.5)
nRBC: 0 % (ref 0.0–0.2)

## 2018-11-20 LAB — POCT I-STAT 7, (LYTES, BLD GAS, ICA,H+H)
Acid-base deficit: 1 mmol/L (ref 0.0–2.0)
Bicarbonate: 23.1 mmol/L (ref 20.0–28.0)
Calcium, Ion: 1.23 mmol/L (ref 1.15–1.40)
HCT: 24 % — ABNORMAL LOW (ref 39.0–52.0)
Hemoglobin: 8.2 g/dL — ABNORMAL LOW (ref 13.0–17.0)
O2 Saturation: 99 %
Potassium: 3.6 mmol/L (ref 3.5–5.1)
Sodium: 136 mmol/L (ref 135–145)
TCO2: 24 mmol/L (ref 22–32)
pCO2 arterial: 36.9 mmHg (ref 32.0–48.0)
pH, Arterial: 7.405 (ref 7.350–7.450)
pO2, Arterial: 134 mmHg — ABNORMAL HIGH (ref 83.0–108.0)

## 2018-11-20 LAB — COMPREHENSIVE METABOLIC PANEL
ALT: 12 U/L (ref 0–44)
AST: 54 U/L — ABNORMAL HIGH (ref 15–41)
Albumin: 3.1 g/dL — ABNORMAL LOW (ref 3.5–5.0)
Alkaline Phosphatase: 60 U/L (ref 38–126)
Anion gap: 9 (ref 5–15)
BUN: 14 mg/dL (ref 8–23)
CO2: 25 mmol/L (ref 22–32)
Calcium: 9 mg/dL (ref 8.9–10.3)
Chloride: 98 mmol/L (ref 98–111)
Creatinine, Ser: 1.04 mg/dL (ref 0.61–1.24)
GFR calc Af Amer: >60 mL/min (ref >60)
GFR calc non Af Amer: >60 mL/min (ref >60)
Glucose, Bld: 102 mg/dL — ABNORMAL HIGH (ref 70–99)
Potassium: 4 mmol/L (ref 3.5–5.1)
Sodium: 132 mmol/L — ABNORMAL LOW (ref 135–145)
Total Bilirubin: 1.8 mg/dL — ABNORMAL HIGH (ref 0.3–1.2)
Total Protein: 6.2 g/dL — ABNORMAL LOW (ref 6.5–8.1)

## 2018-11-20 LAB — LACTATE DEHYDROGENASE: LDH: 320 U/L — ABNORMAL HIGH (ref 98–192)

## 2018-11-20 LAB — PROTIME-INR
INR: 1.2 (ref 0.8–1.2)
Prothrombin Time: 15.3 seconds — ABNORMAL HIGH (ref 11.4–15.2)

## 2018-11-20 LAB — MAGNESIUM: Magnesium: 1.9 mg/dL (ref 1.7–2.4)

## 2018-11-20 LAB — PHOSPHORUS: Phosphorus: 3 mg/dL (ref 2.5–4.6)

## 2018-11-20 MED ORDER — FE FUMARATE-B12-VIT C-FA-IFC PO CAPS
1.0000 | ORAL_CAPSULE | Freq: Two times a day (BID) | ORAL | Status: DC
  Administered 2018-11-20 – 2018-11-23 (×8): 1 via ORAL
  Filled 2018-11-20 (×8): qty 1

## 2018-11-20 MED ORDER — DIGOXIN 125 MCG PO TABS
0.1250 mg | ORAL_TABLET | Freq: Every day | ORAL | Status: DC
  Administered 2018-11-20 – 2018-11-23 (×4): 0.125 mg via ORAL
  Filled 2018-11-20 (×4): qty 1

## 2018-11-20 MED ORDER — LOSARTAN POTASSIUM 25 MG PO TABS
25.0000 mg | ORAL_TABLET | Freq: Every day | ORAL | Status: DC
  Administered 2018-11-20: 25 mg via ORAL
  Filled 2018-11-20 (×2): qty 1

## 2018-11-20 MED ORDER — SPIRONOLACTONE 12.5 MG HALF TABLET
12.5000 mg | ORAL_TABLET | Freq: Every day | ORAL | Status: DC
  Administered 2018-11-20 – 2018-11-23 (×4): 12.5 mg via ORAL
  Filled 2018-11-20 (×4): qty 1

## 2018-11-20 MED ORDER — AMIODARONE HCL IN DEXTROSE 360-4.14 MG/200ML-% IV SOLN
60.0000 mg/h | INTRAVENOUS | Status: AC
  Administered 2018-11-20: 60 mg/h via INTRAVENOUS
  Filled 2018-11-20: qty 200

## 2018-11-20 MED ORDER — SODIUM CHLORIDE 0.9% FLUSH: 10.0000 mL | INTRAVENOUS | Status: DC | PRN

## 2018-11-20 MED ORDER — AMIODARONE HCL IN DEXTROSE 360-4.14 MG/200ML-% IV SOLN
30.0000 mg/h | INTRAVENOUS | Status: DC
  Administered 2018-11-20 – 2018-11-23 (×7): 30 mg/h via INTRAVENOUS
  Filled 2018-11-20 (×10): qty 200

## 2018-11-20 MED ORDER — SODIUM CHLORIDE 0.9% FLUSH
10.0000 mL | Freq: Two times a day (BID) | INTRAVENOUS | Status: DC
  Administered 2018-11-20: 22:00:00 20 mL
  Administered 2018-11-21: 10 mL

## 2018-11-20 MED ORDER — WARFARIN SODIUM 3 MG PO TABS
3.0000 mg | ORAL_TABLET | Freq: Once | ORAL | Status: AC
  Administered 2018-11-20: 3 mg via ORAL
  Filled 2018-11-20: qty 1

## 2018-11-20 MED FILL — Sodium Bicarbonate IV Soln 8.4%: INTRAVENOUS | Qty: 50 | Status: AC

## 2018-11-20 MED FILL — Heparin Sodium (Porcine) Inj 1000 Unit/ML: INTRAMUSCULAR | Qty: 10 | Status: AC

## 2018-11-20 MED FILL — Mannitol IV Soln 20%: INTRAVENOUS | Qty: 500 | Status: AC

## 2018-11-20 MED FILL — Calcium Chloride Inj 10%: INTRAVENOUS | Qty: 10 | Status: AC

## 2018-11-20 MED FILL — Electrolyte-R (PH 7.4) Solution: INTRAVENOUS | Qty: 5000 | Status: AC

## 2018-11-20 MED FILL — Sodium Chloride IV Soln 0.9%: INTRAVENOUS | Qty: 2000 | Status: AC

## 2018-11-20 NOTE — Progress Notes (Addendum)
Patient ID: Keith Nguyen., male   DOB: Apr 02, 1950, 68 y.o.   MRN: [id number]   Advanced Heart Failure VAD Team Note  PCP-Cardiologist: No primary care provider on file.   Subjective:    S/P  HMIII LVAD Aortic valve closure.   Remains on milrinone 0.125 mcg. CO-OX 61%. In A fib RVR.  Diuresed with IV lasix.   Denies SOB. Able to get oob to chair.    LVAD INTERROGATION:  HeartMate III LVAD:   Flow 4.1 liters/min, speed 5400, power 21f PI 4.6.     Objective:    Vital Signs:   Temp:  [97.6 F (36.4 C)-99.3 F (37.4 C)] 98.8 F (37.1 C) (10/29 0814) Resp:  [17-40] 21 (10/29 0700) BP: (74-124)/(59-102) 102/90 (10/29 0400) SpO2:  [96 %-98 %] 97 % (10/29 0400) Arterial Line BP: (59-132)/(44-115) 105/86 (10/29 0700) Weight:  [76.4 kg] 76.4 kg (10/29 0500) Last BM Date: 11/15/18 Mean arterial Pressure 90s   Intake/Output:   Intake/Output Summary (Last 24 hours) at 11/20/2018 0839 Last data filed at 11/20/2018 0600 Gross per 24 hour  Intake 285.84 ml  Output 1885 ml  Net -1599.16 ml     Physical Exam   Physical Exam: GENERAL: no acute distress. HEENT: normal  NECK: Supple, JVP 9-10  .  Carotids2+ bilaterally, no bruits.  No lymphadenopathy or thyromegaly appreciated.  RIJ  CARDIAC:  Mechanical heart sounds with LVAD hum present. Dressing in palce LUNGS:  Clear to auscultation bilaterally.  ABDOMEN:  Soft, round, nontender, positive bowel sounds x4.     LVAD exit site: well-healed and incorporated.  Dressing dry and intact.  No erythema or drainage.  Stabilization device present and accurately applied.  Driveline dressing is being changed daily per sterile technique. EXTREMITIES:  Warm and dry, no cyanosis, clubbing, rash or edema  NEUROLOGIC:  Alert and oriented x 4.   No aphasia.  No dysarthria.  Affect pleasant.       Telemetry   A fib RVR ? Flutter 120s personally reviewed.   EKG   n/a  Labs   Basic Metabolic Panel: Recent Labs  Lab 11/13/2018 1944   11/18/18 0256  11/18/18 1556 11/18/18 1605 11/19/18 0305 11/19/18 0317 11/20/18 0409 11/20/18 0434  NA 135   < > 134*   < > 134* 135 132* 134* 136 132*  K 5.2*   < > 5.2*   < > 4.4 4.4 4.3 4.4 3.6 4.0  CL 106  --  106  --  105  --  103  --   --  98  CO2 22  --  21*  --  21*  --  22  --   --  25  GLUCOSE 134*  --  128*  --  116*  --  99  --   --  102*  BUN 16  --  15  --  14  --  13  --   --  14  CREATININE 0.98  --  1.02  --  1.12  --  1.04  --   --  1.04  CALCIUM 8.2*  --  8.1*  --  8.6*  --  8.9  --   --  9.0  MG 2.8*  --  2.2  --  1.9  --  1.8  --   --  1.9  PHOS  --   --  3.7  --   --   --  3.3  --   --  3.0   < > =  values in this interval not displayed.    Liver Function Tests: Recent Labs  Lab 11/16/18 2146 11/18/18 0256 11/19/18 0305 11/20/18 0434  AST 54* 120* 96* 54*  ALT _0 ALKPHOS 66 33* 46 60  BILITOT 0.7 2.6* 3.7* 1.8*  PROT 7.2 5.1* 5.8* 6.2*  ALBUMIN 2.8* 3.2* 3.1* 3.1*   No results for input(s): LIPASE, AMYLASE in the last 168 hours. No results for input(s): AMMONIA in the last 168 hours.  CBC: Recent Labs  Lab 11/15/18 0339 11/16/18 0337  11/18/2018 1944  11/18/18 0256  11/18/18 1556 11/18/18 1605 11/19/18 0305 11/19/18 0317 11/20/18 0409 11/20/18 0434  WBC 4.7 4.5   < > 8.3  --  8.2  --  14.2*  --  16.5*  --   --  15.1*  NEUTROABS 2.3 2.0  --   --   --  6.6  --   --   --  14.2*  --   --  12.7*  HGB 15.1 14.7   < > 8.7*   < > 7.4*   < > 8.3* 8.8* 8.4* 9.2* 8.2* 8.4*  HCT 45.2 43.1   < > 26.5*   < > 22.0*   < > 24.4* 26.0* 25.1* 27.0* 24.0* 24.7*  MCV 88.6 88.5   < > 91.1  --  89.8  --  90.7  --  91.6  --   --  91.5  PLT 185 209   < > 135*  --  119*  --  90*  --  91*  --   --  104*   < > = values in this interval not displayed.    INR: Recent Labs  Lab 10/29/2018 1800 11/18/18 0256 11/19/18 0305 11/19/18 1012 11/20/18 0434  INR 1.4* 1.3* 1.4* 1.5* 1.2    Other results:  EKG:    Imaging   Dg Chest 1 View  Result  Date: 11/18/2018 CLINICAL DATA:  Recent extubation EXAM: CHEST  1 VIEW COMPARISON:  Film from earlier in the same day. FINDINGS: Swan-Ganz catheter is again identified. Endotracheal tube and gastric catheter have been removed in the interval. LVAD is again seen and stable. Cardiomegaly with tortuous thoracic aorta is noted. Mild right basilar atelectasis is seen new from the prior study. Left basilar atelectasis is again noted. Bilateral thoracostomy tubes are again seen. No pneumothorax is noted. IMPRESSION: No evidence of pneumothorax. Bibasilar atelectatic changes. Electronically Signed   By: Inez Catalina M.D.   On: 11/18/2018 20:29     Medications:     Scheduled Medications: . sodium chloride   Intravenous Once  . acetaminophen  1,000 mg Oral Q6H   Or  . acetaminophen (TYLENOL) oral liquid 160 mg/5 mL  1,000 mg Per Tube Q6H  . allopurinol  100 mg Oral Daily  . amiodarone  400 mg Oral BID  . aspirin EC  325 mg Oral Daily   Or  . aspirin  324 mg Per Tube Daily   Or  . aspirin  300 mg Rectal Daily  . bisacodyl  10 mg Oral Daily   Or  . bisacodyl  10 mg Rectal Daily  . chlorhexidine gluconate (MEDLINE KIT)  15 mL Mouth Rinse BID  . Chlorhexidine Gluconate Cloth  6 each Topical Daily  . colchicine  0.6 mg Oral Daily  . docusate sodium  200 mg Oral Daily  . feeding supplement (ENSURE ENLIVE)  237 mL Oral TID BM  . furosemide  40 mg Intravenous  BID  . insulin aspart  0-24 Units Subcutaneous Q4H  . loratadine  10 mg Oral Daily  . mouth rinse  15 mL Mouth Rinse BID  . pantoprazole  40 mg Oral Daily  . sodium chloride flush  10-40 mL Intracatheter Q12H  . sodium chloride flush  3 mL Intravenous Q12H  . sodium chloride flush  3 mL Intravenous Q12H  . sodium chloride flush  3 mL Intravenous Q12H  . sodium chloride flush  3 mL Intravenous Q12H  . Warfarin - Pharmacist Dosing Inpatient   Does not apply q1800    Infusions: . sodium chloride Stopped (11/18/18 1944)  . sodium chloride     . sodium chloride    . sodium chloride    . sodium chloride    . dexmedetomidine (PRECEDEX) IV infusion Stopped (11/18/18 1001)  . epinephrine Stopped (11/18/18 1828)  . lactated ringers Stopped (10/29/2018 1610)  . milrinone 0.125 mcg/kg/min (11/19/18 1329)  . milrinone 0.125 mcg/kg/min (11/20/18 0600)  . norepinephrine (LEVOPHED) Adult infusion 1 mcg/min (10/29/2018 2128)    PRN Medications: sodium chloride, sodium chloride, acetaminophen, albuterol, bisacodyl, bisacodyl, hydrALAZINE, methocarbamol, morphine injection, ondansetron (ZOFRAN) IV, ondansetron **OR** [DISCONTINUED] ondansetron (ZOFRAN) IV, oxyCODONE, sodium chloride, sodium chloride flush, sodium chloride flush, sodium chloride flush, traMADol     Assessment/Plan:     1. S/P HMIII DT: Extubated, off epinephrine with stable MAP.  Hgb down with expected blood loss. Received 1UPRBC 10/27. LDH 320 -INR 1.2  - Continue ASA 325 until INR 2 then will drop to 81 mg daily.   - Warfarin begun. 2. Acute on chronic systolic CHF: Nonischemic cardiomyopathy diagnosed in 2017 with echo showing EF 40% and LHC showing mild nonobstructive CAD. He saw Dr. Wynonia Lawman in the past, but no cardiology evaluation since 2017. 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 He has drunk moderate ETOH in the past (no longer drinks) but does not seem to have drunk enough to cause a cardiomyopathy. He was markedly volume overloaded on exam initially with biventricular failure and NYHA class IV at admission with rise in creatinine concerning for cardiorenal syndrome. Initial co-ox 40% suggested low CO, milrinone started and increased to 0.375.    RHC showed R>L heart failure.  Cardiac output looked good on milrinone by Fick but was low by thermodilution (discordant values).  Repeat LHC/RHC showed very low filling  pressures and good cardiac index by Fick.  No significant coronary disease noted.  S/P HMIII LVAD as above.  - CO-OX stable. Stop milrinone 0.125 mcg/kg/min. Start digoxin 0.125 mg daily.  -Continue  IV Lasix 40 mg twice a day.   2. AKI on CKD stage 3: Suspect this may be a combination of cardiorenal syndrome and contrast nephropathy (had CTA chest at admission). Minimal contrast with coronary angiography.  Creatinine stable today at 1.04. 3. 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 He was drinking moderate ETOH as well but has quit completely for about 1 month. Childs A per GI.  4. COPD/smoking: He recently quit smoking.CT chest with moderate emphysema. He is now off oxygen. Minimal obstruction on PFTs. 5. Atrial flutter/fibrillation: Appears to be in atrial flutter with RVR today.   - Start amio drip. Stop po amio   6. Hyponatremia:  -Sodium 132 7. Aortic insufficiency: ?Moderate range. AV closure with LVAD 11/19/2018  Discussed with Dr Orvan Seen at bedside. Remove A line. Remove central line. Place PICC. Continue to mobilize.   I reviewed the LVAD parameters from today, and compared the results to the patient's prior recorded data.  No programming changes were made.  The LVAD is functioning within specified parameters.  The patient performs LVAD self-test daily.  LVAD interrogation was negative for any significant power changes, alarms or PI events/speed drops.  LVAD equipment check completed and is in good working order.  Back-up equipment present.   LVAD education done on emergency procedures and precautions and reviewed exit site care.  Length of Stay: [place], NP 11/20/2018, 8:39 AM  VAD Team --- VAD ISSUES ONLY--- Pager [phone] (7am - 7am)  Advanced Heart Failure Team  Pager [phone] (M-F; 7a - 4p)  Please contact [facility] for night-coverage after hours (4p -7a ) and weekends on [email or web address]  Patient seen with NP, agree  with the above note .  He diuresed well yesterday, weight down 2 lbs.  CVP 14, co-ox 61% on milrinone 0.25. Creatinine stable.  He appears to be in atrial flutter rate around 120.   General: Well appearing this am. NAD.  HEENT: Normal. Neck: Supple, JVP 12 cm. Carotids OK.  Cardiac:  Mechanical heart sounds with LVAD hum present.  Lungs:  CTAB, normal effort.  Abdomen:  NT, ND, no HSM. No bruits or masses. +BS  LVAD exit site: Well-healed and incorporated. Dressing dry and intact. No erythema or drainage. Stabilization device present and accurately applied. Driveline dressing changed daily per sterile technique. Extremities:  Warm and dry. No cyanosis, clubbing, rash, or edema.  Neuro:  Alert & oriented x 3. Cranial nerves grossly intact. Moves all 4 extremities w/o difficulty. Affect pleasant    Patient appears to be in atrial flutter with rate 120s today, will stop po amiodarone and start amiodarone gtt. Warfarin started.   Patient remains volume overloaded post-op, good diuresis with IV Lasix.  Continue Lasix 40 mg IV bid.   MAP generally 80s but occasionally rising.  Will start spironolactone 12.5 daily .  Stop milrinone today with good co-ox.  Will start digoxin 0.125.   CRITICAL CARE Performed by: Loralie Champagne  Total critical care time: 40 minutes  Critical care time was exclusive of separately billable procedures and treating other patients.  Critical care was necessary to treat or prevent imminent or life-threatening deterioration.  Critical care was time spent personally by me on the following activities: development of treatment plan with patient and/or surrogate as well as nursing, discussions with consultants, evaluation of patient's response to treatment, examination of patient, obtaining history from patient or surrogate, ordering and performing treatments and interventions, ordering and review of laboratory studies, ordering and review of radiographic studies, pulse  oximetry and re-evaluation of patient's condition.  Loralie Champagne 11/20/2018 9:57 AM

## 2018-11-20 NOTE — Progress Notes (Signed)
LVAD Coordinator Rounding Note:  Admitted 11/04/2018 due to CHF excerbation.   HM3 LVAD implanted on 11/16/2018 by Dr. Orvan Seen under DT criteria.  Pt sitting up in bed states he had a good night and has minimal pain.  Vital signs: Temp: 97.9 HR: 122 Doppler Pressure: not done Automatic BP: 105/86 (94) O2 Sat: 97 on 2 L/[place] Wt: 173.7>170.8>168.4lbs     LVAD interrogation reveals:   Speed: 5400 Flow: 3.9 Power:  3.8 PI: 6.2 Alarms: none  Events:  none Hematocrit: 24 Fixed speed: 5400 Low speed limit: 5100   Drive Line:  Existing VAD dressing removed and site care performed using sterile technique. Drive line exit site cleaned with Chlora prep applicators x 2, allowed to dry, and gauze dressing with silver strip re-applied. Exit site with small amount of dried blood, the velour is fully implanted at exit site. No redness, tenderness, drainage, foul odor or rash noted. Drive line anchor secure.  Daily dressing changes using daily kits by VAD Coordinator or nurse champion. Wife observed dressing today. Next dressing change due 11/21/18.     Labs:  LDH trend: [id number]  INR trend: 1.3>1.04>1.2  Anticoagulation Plan: -INR Goal: 2-2.5 -ASA Dose: 325 until INR is therapeutic  Blood Products:  -intra-op: 10/26/2018 -2 u PLTs -4 u FFP  Post op: 10/27-20- 1 u PRBCs   Device: -N/A  Respiratory: 2L/[place] nitric off 11/19/18  Gtts: Precedex 0.1 mcg/hr-off 10/27 Epi 2.5 mcg/min-off 10/27 Milrinone 0.125 mcg/kg/min-off 11/20/18 Amiodarone 60 mg/hr  Renal:  -BUN/CRT: 15/1.02>13/1.04>1.04  Adverse Events on VAD: -  VAD education:  1. Wife observed driveline dressing change today.  Plan/Recommendations:   1. Daily dressing changes by VAD coordinator or nurse champion.  2. Page VAD coordinator with equipment issues or driveline questions.  Tanda Rockers RN, BSN VAD Coordinator 24/7 Pager [phone]

## 2018-11-20 NOTE — Progress Notes (Signed)
3 Days Post-Op Procedure(s) (LRB): INSERTION OF IMPLANTABLE LEFT VENTRICULAR ASSIST DEVICE (N/A) AORTIC VALVE REPAIR (N/A) TRANSESOPHAGEAL ECHOCARDIOGRAM (TEE) (N/A) Subjective: Mild pain at incisional level  Objective: Vital signs in last 24 hours: Temp:  [97.6 F (36.4 C)-99.3 F (37.4 C)] 98.8 F (37.1 C) (10/29 [id number]) Cardiac Rhythm: Sinus tachycardia (10/29 0000) Resp:  [17-40] 21 (10/29 0700) BP: (74-124)/(59-102) 102/90 (10/29 0400) SpO2:  [96 %-98 %] 97 % (10/29 0400) Arterial Line BP: (59-132)/(44-115) 105/86 (10/29 0700) Weight:  [76.4 kg] 76.4 kg (10/29 0500)  Hemodynamic parameters for last 24 hours: PAP: (47-56)/(24-32) 49/28 CVP:  [11 mmHg-26 mmHg] 14 mmHg  Intake/Output from previous day: 10/28 0701 - 10/29 0700 In: 335.9 [I.V.:167.5; IV Piggyback:168.4] Out: 2035 [Urine:1690; Chest Tube:345] Intake/Output this shift: No intake/output data recorded.  General appearance: alert, cooperative and no distress Neurologic: intact Heart: tachy Lungs: clear to auscultation bilaterally Abdomen: soft, non-tender; bowel sounds normal; no masses,  no organomegaly Extremities: edema very mild Wound: dressing dry  Lab Results: Recent Labs    11/19/18 0305  11/20/18 0409 11/20/18 0434  WBC 16.5*  --   --  15.1*  HGB 8.4*   < > 8.2* 8.4*  HCT 25.1*   < > 24.0* 24.7*  PLT 91*  --   --  104*   < > = values in this interval not displayed.   BMET:  Recent Labs    11/19/18 0305  11/20/18 0409 11/20/18 0434  NA 132*   < > 136 132*  K 4.3   < > 3.6 4.0  CL 103  --   --  98  CO2 22  --   --  25  GLUCOSE 99  --   --  102*  BUN 13  --   --  14  CREATININE 1.04  --   --  1.04  CALCIUM 8.9  --   --  9.0   < > = values in this interval not displayed.    PT/INR:  Recent Labs    11/20/18 0434  LABPROT 15.3*  INR 1.2   ABG    Component Value Date/Time   PHART 7.397 11/20/2018 0412   HCO3 27.5 11/20/2018 0412   TCO2 24 11/20/2018 0409   ACIDBASEDEF 1.0  11/20/2018 0409   O2SAT 61.0 11/20/2018 0412   O2SAT 65.6 11/20/2018 0412   CBG (last 3)  Recent Labs    11/20/18 0404 11/20/18 0406 11/20/18 0815  GLUCAP 34* 102* 126*    Assessment/Plan: S/P Procedure(s) (LRB): INSERTION OF IMPLANTABLE LEFT VENTRICULAR ASSIST DEVICE (N/A) AORTIC VALVE REPAIR (N/A) TRANSESOPHAGEAL ECHOCARDIOGRAM (TEE) (N/A) Mobilize Diuresis d/c tubes/lines   LOS: 16 days    Wonda Olds 11/20/2018

## 2018-11-20 NOTE — Progress Notes (Signed)
Chaplain engaged in initial visit with Mr. Keith Nguyen and his wife.  Chaplain explained spiritual care services offered.  Chaplain will follow-up as needed.

## 2018-11-20 NOTE — Progress Notes (Signed)
Patient ID: Keith Nguyen., male   DOB: 09/05/1950, 68 y.o.   MRN: [id number]  This NP visited patient at the bedside as a follow up for palliative medicine needs and emotional support.  Patient is alert and oriented and progressing well post LVAD surgery.  He gives me a thumbs up and "feels good"  Encouragement and emotional support offered.  He is encouraged to contact me with questions or concerns  Discussed with patient the importance of continued conversation with his family and the medical providers regarding overall plan of care and treatment options,  ensuring decisions are within the context of the patient's values and GOCs.  Questions and concerns addressed  PMT will continue to support holistically  Total time spent on the unit was 20 minutes  Greater than 50% of the time was spent in counseling and coordination of care  Wadie Lessen NP  Palliative Medicine Team Team Phone # [phone] Pager [phone]

## 2018-11-20 NOTE — Progress Notes (Signed)
Peripherally Inserted Central Catheter/Midline Placement  The IV Nurse has discussed with the patient and/or persons authorized to consent for the patient, the purpose of this procedure and the potential benefits and risks involved with this procedure.  The benefits include less needle sticks, lab draws from the catheter, and the patient may be discharged home with the catheter. Risks include, but not limited to, infection, bleeding, blood clot (thrombus formation), and puncture of an artery; nerve damage and irregular heartbeat and possibility to perform a PICC exchange if needed/ordered by physician.  Alternatives to this procedure were also discussed.  Bard Power PICC patient education guide, fact sheet on infection prevention and patient information card has been provided to patient /or left at bedside.    PICC/Midline Placement Documentation  PICC Double Lumen 11/13/18 PICC Right Brachial 39 cm 0 cm (Active)  Indication for Insertion or Continuance of Line Prolonged intravenous therapies 11/20/18 1844  Exposed Catheter (cm) 0 cm 11/20/18 1844  Site Assessment Dry;Clean;Intact 11/20/18 1844  Lumen #1 Status Flushed;Blood return noted 11/20/18 1844  Lumen #2 Status Flushed;Blood return noted;Saline locked 11/20/18 1844  Dressing Type Transparent 11/20/18 1844  Dressing Status Clean;Dry;Intact;Antimicrobial disc in place 11/20/18 1844  Dressing Change Due 11/27/18 11/20/18 1844       Scotty Court 11/20/2018, 6:46 PM

## 2018-11-20 NOTE — Progress Notes (Signed)
Inpatient Diabetes Program Recommendations  AACE/ADA: New Consensus Statement on Inpatient Glycemic Control (2015)  Target Ranges:  Prepandial:   less than 140 mg/dL      Peak postprandial:   less than 180 mg/dL (1-2 hours)      Critically ill patients:  140 - 180 mg/dL   Lab Results  Component Value Date   GLUCAP 126 (H) 11/20/2018   HGBA1C 6.5 (H) 11/16/2018    Review of Glycemic Control Results for [name], Keith Nguyen (MRN [id number]) as of 11/20/2018 09:42  Ref. Range 11/19/2018 20:07 11/19/2018 23:41 11/20/2018 04:04 11/20/2018 04:06 11/20/2018 08:15  Glucose-Capillary Latest Ref Range: 70 - 99 mg/dL 118 (H) 112 (H) 34 (LL) 102 (H) 126 (H)   Inpatient Diabetes Program Recommendations:   Noted CBG 34 yesterday without insulin administered. Please consider: D/C scale for now or lower to sensitive correction scale  Thank you, Nani Gasser. Sandip Power, RN, MSN, CDE  Diabetes Coordinator Inpatient Glycemic Control Team Team Pager [phone] (8am-5pm) 11/20/2018 9:45 AM

## 2018-11-20 NOTE — Progress Notes (Signed)
ANTICOAGULATION CONSULT NOTE - Follow Up Consult  Pharmacy Consult for warfarin Indication: LVAD  Allergies  Allergen Reactions  . Lipitor [Atorvastatin]     Elevated liver enzymes    Patient Measurements: Height: 5\' 11"  (180.3 cm) Weight: 168 lb 6.9 oz (76.4 kg) IBW/kg (Calculated) : 75.3  Vital Signs: Temp: 98.8 F (37.1 C) (10/29 0814) Temp Source: Oral (10/29 0814) BP: 102/90 (10/29 0400)  Labs: Recent Labs    10/29/2018 1335  11/12/2018 1800  11/18/18 1556  11/19/18 0305 11/19/18 0317 11/19/18 1012 11/20/18 0409 11/20/18 0434  HGB 9.9*   < >  --    < > 8.3*   < > 8.4* 9.2*  --  8.2* 8.4*  HCT 29.4*   < >  --    < > 24.4*   < > 25.1* 27.0*  --  24.0* 24.7*  PLT 130*  --  123*   < > 90*  --  91*  --   --   --  104*  APTT 40*  --  45*  --   --   --   --   --   --   --   --   LABPROT 16.4*  --  16.6*   < >  --   --  17.0*  --  17.8*  --  15.3*  INR 1.3*  --  1.4*   < >  --   --  1.4*  --  1.5*  --  1.2  CREATININE 0.94  --   --    < > 1.12  --  1.04  --   --   --  1.04   < > = values in this interval not displayed.    Estimated Creatinine Clearance: 71.4 mL/min (by C-G formula based on SCr of 1.04 mg/dL).   Medical History: Past Medical History:  Diagnosis Date  . Abnormal cardiac function test 09/16/2015  . Aortic atherosclerosis ([place]) 09/16/2015  . History of hepatitis C 09/16/2015   Prior treatment with Harvoni   . Hyperlipidemia 09/16/2015  . Hypertensive heart disease without CHF 09/16/2015   Assessment: 68 year old male s/p HM-3 implant with aortic valve closure 10/26. Warfarin started on 10/27. Patient with afib w/ RVR and amiodarone PO switched to gtt. Drinking Ensure supplements as well averaging about 3 per day. Will need to watch INR closely.   INR today is 1.2, Hgb down 8.4, pltc 104, LDH stable 300s.  Goal of Therapy:  INR goal 2-2.5 Monitor platelets by anticoagulation protocol: Yes   Plan:  Warfarin 3mg  tonight Daily INR Decrease aspirin to  81mg  when INR closer to 2  Vertis Kelch, PharmD PGY2 Cardiology Pharmacy Resident Phone [phone] 11/20/2018       9:18 AM  Please check [email or web address] for unit-specific pharmacist phone numbers

## 2018-11-21 ENCOUNTER — Telehealth (HOSPITAL_COMMUNITY): Payer: Self-pay | Admitting: Licensed Clinical Social Worker

## 2018-11-21 DIAGNOSIS — Z95811 Presence of heart assist device: Secondary | ICD-10-CM | POA: Diagnosis not present

## 2018-11-21 DIAGNOSIS — I5021 Acute systolic (congestive) heart failure: Secondary | ICD-10-CM

## 2018-11-21 DIAGNOSIS — I5023 Acute on chronic systolic (congestive) heart failure: Secondary | ICD-10-CM | POA: Diagnosis not present

## 2018-11-21 LAB — CBC WITH DIFFERENTIAL/PLATELET
Abs Immature Granulocytes: 0.05 10*3/uL (ref 0.00–0.07)
Basophils Absolute: 0 10*3/uL (ref 0.0–0.1)
Basophils Relative: 0 %
Eosinophils Absolute: 0.1 10*3/uL (ref 0.0–0.5)
Eosinophils Relative: 1 %
HCT: 24.1 % — ABNORMAL LOW (ref 39.0–52.0)
Hemoglobin: 7.9 g/dL — ABNORMAL LOW (ref 13.0–17.0)
Immature Granulocytes: 1 %
Lymphocytes Relative: 10 %
Lymphs Abs: 1 10*3/uL (ref 0.7–4.0)
MCH: 30.3 pg (ref 26.0–34.0)
MCHC: 32.8 g/dL (ref 30.0–36.0)
MCV: 92.3 fL (ref 80.0–100.0)
Monocytes Absolute: 0.5 10*3/uL (ref 0.1–1.0)
Monocytes Relative: 6 %
Neutro Abs: 7.7 10*3/uL (ref 1.7–7.7)
Neutrophils Relative %: 82 %
Platelets: 121 10*3/uL — ABNORMAL LOW (ref 150–400)
RBC: 2.61 MIL/uL — ABNORMAL LOW (ref 4.22–5.81)
RDW: 16.9 % — ABNORMAL HIGH (ref 11.5–15.5)
WBC: 9.3 10*3/uL (ref 4.0–10.5)
nRBC: 0 % (ref 0.0–0.2)

## 2018-11-21 LAB — GLUCOSE, CAPILLARY
Glucose-Capillary: 103 mg/dL — ABNORMAL HIGH (ref 70–99)
Glucose-Capillary: 119 mg/dL — ABNORMAL HIGH (ref 70–99)
Glucose-Capillary: 127 mg/dL — ABNORMAL HIGH (ref 70–99)
Glucose-Capillary: 141 mg/dL — ABNORMAL HIGH (ref 70–99)
Glucose-Capillary: 34 mg/dL — CL (ref 70–99)
Glucose-Capillary: 98 mg/dL (ref 70–99)
Glucose-Capillary: 99 mg/dL (ref 70–99)

## 2018-11-21 LAB — PROTIME-INR
INR: 1.2 (ref 0.8–1.2)
Prothrombin Time: 14.7 seconds (ref 11.4–15.2)

## 2018-11-21 LAB — MAGNESIUM: Magnesium: 1.8 mg/dL (ref 1.7–2.4)

## 2018-11-21 LAB — BASIC METABOLIC PANEL
Anion gap: 10 (ref 5–15)
BUN: 17 mg/dL (ref 8–23)
CO2: 26 mmol/L (ref 22–32)
Calcium: 8.9 mg/dL (ref 8.9–10.3)
Chloride: 96 mmol/L — ABNORMAL LOW (ref 98–111)
Creatinine, Ser: 0.86 mg/dL (ref 0.61–1.24)
GFR calc Af Amer: >60 mL/min (ref >60)
GFR calc non Af Amer: >60 mL/min (ref >60)
Glucose, Bld: 159 mg/dL — ABNORMAL HIGH (ref 70–99)
Potassium: 3.7 mmol/L (ref 3.5–5.1)
Sodium: 132 mmol/L — ABNORMAL LOW (ref 135–145)

## 2018-11-21 LAB — PHOSPHORUS: Phosphorus: 3.3 mg/dL (ref 2.5–4.6)

## 2018-11-21 LAB — COOXEMETRY PANEL
Carboxyhemoglobin: 1.1 % (ref 0.5–1.5)
Methemoglobin: 1 % (ref 0.0–1.5)
O2 Saturation: 55.5 %
Total hemoglobin: 14.9 g/dL (ref 12.0–16.0)

## 2018-11-21 LAB — HEPARIN LEVEL (UNFRACTIONATED): Heparin Unfractionated: <0.1 IU/mL — ABNORMAL LOW (ref 0.30–0.70)

## 2018-11-21 LAB — LACTATE DEHYDROGENASE: LDH: 257 U/L — ABNORMAL HIGH (ref 98–192)

## 2018-11-21 MED ORDER — FUROSEMIDE 10 MG/ML IJ SOLN
60.0000 mg | Freq: Two times a day (BID) | INTRAMUSCULAR | Status: DC
  Administered 2018-11-21 – 2018-11-23 (×5): 60 mg via INTRAVENOUS
  Filled 2018-11-21 (×5): qty 6

## 2018-11-21 MED ORDER — HEPARIN (PORCINE) 25000 UT/250ML-% IV SOLN
1350.0000 [IU]/h | INTRAVENOUS | Status: DC
  Administered 2018-11-21: 10:00:00 500 [IU]/h via INTRAVENOUS
  Administered 2018-11-22: 19:00:00 900 [IU]/h via INTRAVENOUS
  Administered 2018-11-23: 1350 [IU]/h via INTRAVENOUS
  Filled 2018-11-21 (×3): qty 250

## 2018-11-21 MED ORDER — MAGNESIUM SULFATE 2 GM/50ML IV SOLN
2.0000 g | Freq: Once | INTRAVENOUS | Status: AC
  Administered 2018-11-21: 08:00:00 2 g via INTRAVENOUS
  Filled 2018-11-21: qty 50

## 2018-11-21 MED ORDER — POTASSIUM CHLORIDE CRYS ER 20 MEQ PO TBCR
20.0000 meq | EXTENDED_RELEASE_TABLET | ORAL | Status: AC
  Administered 2018-11-21 (×3): 20 meq via ORAL
  Filled 2018-11-21 (×3): qty 1

## 2018-11-21 MED ORDER — WARFARIN SODIUM 3 MG PO TABS
3.0000 mg | ORAL_TABLET | Freq: Once | ORAL | Status: AC
  Administered 2018-11-21: 18:00:00 3 mg via ORAL
  Filled 2018-11-21: qty 1

## 2018-11-21 MED ORDER — FUROSEMIDE 10 MG/ML IJ SOLN
20.0000 mg | Freq: Once | INTRAMUSCULAR | Status: AC
  Administered 2018-11-21: 08:00:00 20 mg via INTRAVENOUS
  Filled 2018-11-21: qty 2

## 2018-11-21 MED ORDER — POLYETHYLENE GLYCOL 3350 17 G PO PACK
17.0000 g | PACK | Freq: Every day | ORAL | Status: DC
  Administered 2018-11-21 – 2018-11-23 (×3): 17 g via ORAL
  Filled 2018-11-21 (×3): qty 1

## 2018-11-21 NOTE — Progress Notes (Signed)
ANTICOAGULATION CONSULT NOTE - Follow Up Consult  Pharmacy Consult for warfarin + heparin Indication: LVAD  Allergies  Allergen Reactions  . Lipitor [Atorvastatin]     Elevated liver enzymes    Patient Measurements: Height: 5\' 11"  (180.3 cm) Weight: 167 lb 9.6 oz (76 kg) IBW/kg (Calculated) : 75.3  Heparin dosing weight: 75 kg  Vital Signs: Temp: 98.4 F (36.9 C) (10/30 1559) Temp Source: Oral (10/30 1559) BP: 93/79 (10/30 1400) Pulse Rate: 65 (10/30 1400)  Labs: Recent Labs    11/19/18 0305  11/19/18 1012 11/20/18 0409 11/20/18 0434 11/21/18 0354 11/21/18 1454  HGB 8.4*   < >  --  8.2* 8.4* 7.9*  --   HCT 25.1*   < >  --  24.0* 24.7* 24.1*  --   PLT 91*  --   --   --  104* 121*  --   LABPROT 17.0*  --  17.8*  --  15.3* 14.7  --   INR 1.4*  --  1.5*  --  1.2 1.2  --   HEPARINUNFRC  --   --   --   --   --   --  <0.10*  CREATININE 1.04  --   --   --  1.04 0.86  --    < > = values in this interval not displayed.    Estimated Creatinine Clearance: 86.3 mL/min (by C-G formula based on SCr of 0.86 mg/dL).   Medical History: Past Medical History:  Diagnosis Date  . Abnormal cardiac function test 09/16/2015  . Aortic atherosclerosis ([place]) 09/16/2015  . History of hepatitis C 09/16/2015   Prior treatment with Harvoni   . Hyperlipidemia 09/16/2015  . Hypertensive heart disease without CHF 09/16/2015   Assessment: 68 year old male s/p HM-3 implant with aortic valve closure 10/26. Warfarin started on 10/27. Patient with afib w/ RVR and amiodarone PO switched to gtt. HR 70s this AM. Drinking Ensure supplements as well averaging about 3 per day. Will need to watch INR closely.   INR today is 1.2, Hgb down 7.9, pltc 121, LDH down 257.  Heparin level this evening undetectable.  Chest tube output not increasing per RN report, spoke to Dr. Orvan Seen, okay to increase heparin.  Goal of Therapy:  INR goal 2-2.5 Monitor platelets by anticoagulation protocol: Yes   Plan:   Increase IV heparin to 700 units/hr. Recheck heparin level in 8 hrs. Daily INR, heparin level, CBC Will need to follow up with TCTS prior to rate increases on heparin drip Decrease aspirin to 81mg  when INR closer to 2  [place] Crane, Roylene Reason, [facility] Clinical Pharmacist Phone [phone]  11/21/2018 4:22 PM   Please check [email or web address] for unit-specific pharmacist phone numbers

## 2018-11-21 NOTE — Progress Notes (Signed)
VAD Education: 1. Wife at bedside, she completed dressing change today under VAD Coordinator supervision.  2. Gave wife copy of HM III Patient Handbook and asked her to complete reading. 3. Wife asked if daughter can be trained to do dressing change as well. She will speak with her daughter and ask what day she can come in next week. VAD Coordinator will be available to teach.  Drive Line:  Wife at bedside. She removed existing VAD dressing and site care performed using sterile technique. Drive line exit site cleaned with Chlora prep applicators x 2, allowed to dry, and gauze dressing with silver strip re-applied. Exit site with no drainage, the velour is fully implanted at exit site. No redness, tenderness, drainage, foul odor or rash noted. Drive line anchor secure.  Daily dressing changes using daily kits by VAD Coordinator, nurse champion, or trained caregiver (wife). Next dressing change due 11/22/18.

## 2018-11-21 NOTE — Progress Notes (Signed)
ANTICOAGULATION CONSULT NOTE - Follow Up Consult  Pharmacy Consult for warfarin + heparin Indication: LVAD  Allergies  Allergen Reactions  . Lipitor [Atorvastatin]     Elevated liver enzymes    Patient Measurements: Height: 5\' 11"  (180.3 cm) Weight: 167 lb 9.6 oz (76 kg) IBW/kg (Calculated) : 75.3  Heparin dosing weight: 75 kg  Vital Signs: Temp: 98.1 F (36.7 C) (10/30 0820) Temp Source: Oral (10/30 0820) BP: 82/73 (10/30 0800) Pulse Rate: 69 (10/30 0400)  Labs: Recent Labs    11/19/18 0305  11/19/18 1012 11/20/18 0409 11/20/18 0434 11/21/18 0354  HGB 8.4*   < >  --  8.2* 8.4* 7.9*  HCT 25.1*   < >  --  24.0* 24.7* 24.1*  PLT 91*  --   --   --  104* 121*  LABPROT 17.0*  --  17.8*  --  15.3* 14.7  INR 1.4*  --  1.5*  --  1.2 1.2  CREATININE 1.04  --   --   --  1.04 0.86   < > = values in this interval not displayed.    Estimated Creatinine Clearance: 86.3 mL/min (by C-G formula based on SCr of 0.86 mg/dL).   Medical History: Past Medical History:  Diagnosis Date  . Abnormal cardiac function test 09/16/2015  . Aortic atherosclerosis ([place]) 09/16/2015  . History of hepatitis C 09/16/2015   Prior treatment with Harvoni   . Hyperlipidemia 09/16/2015  . Hypertensive heart disease without CHF 09/16/2015   Assessment: 68 year old male s/p HM-3 implant with aortic valve closure 10/26. Warfarin started on 10/27. Patient with afib w/ RVR and amiodarone PO switched to gtt. HR 70s this AM. Drinking Ensure supplements as well averaging about 3 per day. Will need to watch INR closely.   INR today is 1.2, Hgb down 7.9, pltc 121, LDH down 257.  Goal of Therapy:  INR goal 2-2.5 Monitor platelets by anticoagulation protocol: Yes   Plan:  Warfarin 3mg  again tonight Start IV heparin 500 units/hr per TCTS (6.5-7 units/kg/hr) Daily INR, heparin level, CBC Will need to follow up with TCTS prior to rate increases on heparin drip Decrease aspirin to 81mg  when INR closer to  2  Vertis Kelch, PharmD PGY2 Cardiology Pharmacy Resident Phone [phone] 11/21/2018       8:28 AM  Please check [email or web address] for unit-specific pharmacist phone numbers

## 2018-11-21 NOTE — Progress Notes (Signed)
LVAD Coordinator Rounding Note:  Admitted 10/31/2018 due to CHF excerbation.   HM3 LVAD implanted on 10/23/2018 by Dr. Orvan Seen under DT criteria.  Pt sitting up in chair after walking with PT this am. He reports no pain.  Vital signs: Temp: 98.4 HR: 79 A-line: 89/77 (82) O2 Sat: 97%  on 2 L/[place] Wt: 173.7>170.8>168.4>167.6 lbs     LVAD interrogation reveals:  Speed: 5400 Flow: 4.0 Power:  3.9w PI: 3.6 Alarms: none  Events:  none Hematocrit: 27 Fixed speed: 5400 Low speed limit: 5100   Drive Line:  Existing VAD dressing C/D/I; anchor intact. Daily dressing changes using daily kits by VAD Coordinator, nurse champion, or trained caregiver (wife). Next dressing change due 11/21/18.  Labs:  LDH trend: 309>369>320>257  INR trend: 1.3>1.04>1.2>1.2  Anticoagulation Plan: -INR Goal: 2-2.5 -ASA Dose: 325 until INR is therapeutic  Blood Products:  -intra-op: 10/27/2018 -2 u PLTs -4 u FFP  Post op: 10/27-20- 1 u PRBCs   Device: -N/A  Respiratory: 2L/[place] nitric off 11/19/18  Gtts: Amiodarone 30 mg/hr  Labs:  LDH - 369>320>257  INR - 1.5>1.2>1.2   Adverse Events on VAD: -  VAD education:  1. Wife to change dressing today under VAD Coordinator supervision.  Plan/Recommendations:  1. Daily dressing changes by VAD coordinator or nurse champion.  2. Page VAD coordinator with equipment issues or driveline questions.  Zada Girt RN, BSN VAD Coordinator 24/7 Pager [phone]

## 2018-11-21 NOTE — Progress Notes (Signed)
Patient having increased PI events this hour along with HR <60 in atrial flutter, / junction rhythm. BP stable, patient alert and oriented with no complaints of pain  Spoke with VAD Coordinator and since patient is asymptomatic at this time, continue to monitor and will likely need setting adjusted in AM.

## 2018-11-21 NOTE — Progress Notes (Signed)
EVENING ROUNDS NOTE :     [address]       [place] [address]             [phone]                 4 Days Post-Op Procedure(s) (LRB): INSERTION OF IMPLANTABLE LEFT VENTRICULAR ASSIST DEVICE (N/A) AORTIC VALVE REPAIR (N/A) TRANSESOPHAGEAL ECHOCARDIOGRAM (TEE) (N/A)   Total Length of Stay:  LOS: 17 days  Events:  No events Resting comfortably Good flows    BP 102/84   Pulse (!) 33   Temp 98.4 F (36.9 C) (Oral)   Resp (!) 23   Ht 5\' 11"  (1.803 m)   Wt 76 kg   SpO2 99%   BMI 23.38 kg/m   CVP:  [8 mmHg-15 mmHg] 10 mmHg     . sodium chloride Stopped (11/18/18 1944)  . sodium chloride    . sodium chloride    . sodium chloride    . sodium chloride    . amiodarone 30 mg/hr (11/21/18 1635)  . dexmedetomidine (PRECEDEX) IV infusion Stopped (11/18/18 1001)  . epinephrine Stopped (11/18/18 1828)  . heparin 700 Units/hr (11/21/18 1635)  . lactated ringers Stopped (10/25/2018 1610)  . norepinephrine (LEVOPHED) Adult infusion 1 mcg/min (11/04/2018 2128)    I/O last 3 completed shifts: In: 2451.9 [P.O.:1211; I.V.:490.9; Other:750] Out: [id number]; Chest Tube:600]   CBC Latest Ref Rng & Units 11/21/2018 11/20/2018 11/20/2018  WBC 4.0 - 10.5 K/uL 9.3 15.1(H) -  Hemoglobin 13.0 - 17.0 g/dL 7.9(L) 8.4(L) 8.2(L)  Hematocrit 39.0 - 52.0 % 24.1(L) 24.7(L) 24.0(L)  Platelets 150 - 400 K/uL 121(L) 104(L) -    BMP Latest Ref Rng & Units 11/21/2018 11/20/2018 11/20/2018  Glucose 70 - 99 mg/dL 159(H) 102(H) -  BUN 8 - 23 mg/dL 17 14 -  Creatinine 0.61 - 1.24 mg/dL 0.86 1.04 -  Sodium 135 - 145 mmol/L 132(L) 132(L) 136  Potassium 3.5 - 5.1 mmol/L 3.7 4.0 3.6  Chloride 98 - 111 mmol/L 96(L) 98 -  CO2 22 - 32 mmol/L 26 25 -  Calcium 8.9 - 10.3 mg/dL 8.9 9.0 -    ABG    Component Value Date/Time   PHART 7.397 11/20/2018 0412   PCO2ART 45.7 11/20/2018 0412   PO2ART 46.2 (L) 11/20/2018 0412   HCO3 27.5 11/20/2018 0412   TCO2 24 11/20/2018 0409   ACIDBASEDEF 1.0 11/20/2018 0409   O2SAT 55.5 11/21/2018 0340       Melodie Bouillon, MD 11/21/2018 4:47 PM

## 2018-11-21 NOTE — Progress Notes (Signed)
4 Days Post-Op Procedure(s) (LRB): INSERTION OF IMPLANTABLE LEFT VENTRICULAR ASSIST DEVICE (N/A) AORTIC VALVE REPAIR (N/A) TRANSESOPHAGEAL ECHOCARDIOGRAM (TEE) (N/A) Subjective: Feeling better  Objective: Vital signs in last 24 hours: Temp:  [97.7 F (36.5 C)-99.2 F (37.3 C)] 98.1 F (36.7 C) (10/30 0820) Pulse Rate:  [69-123] 69 (10/30 0400) Cardiac Rhythm: Atrial flutter (10/30 0400) Resp:  [12-35] 27 (10/30 0800) BP: (77-117)/(51-91) 82/73 (10/30 0800) SpO2:  [99 %] 99 % (10/30 0400) Arterial Line BP: (93-105)/(76-82) 105/76 (10/29 1300) Weight:  [76 kg] 76 kg (10/30 0500)  Hemodynamic parameters for last 24 hours: CVP:  [11 mmHg-15 mmHg] 11 mmHg  Intake/Output from previous day: 10/29 0701 - 10/30 0700 In: 2436.7 [P.O.:1211; I.V.:475.7] Out: 3065 [Urine:2695; Chest Tube:370] Intake/Output this shift: Total I/O In: -  Out: 40 [Urine:40]  General appearance: alert and cooperative Neurologic: intact Heart: regular rate and rhythm, S1, S2 normal, no murmur, click, rub or gallop Lungs: clear to auscultation bilaterally Abdomen: soft, non-tender; bowel sounds normal; no masses,  no organomegaly Extremities: edema mild Wound: dressed, dry  Lab Results: Recent Labs    11/20/18 0434 11/21/18 0354  WBC 15.1* 9.3  HGB 8.4* 7.9*  HCT 24.7* 24.1*  PLT 104* 121*   BMET:  Recent Labs    11/20/18 0434 11/21/18 0354  NA 132* 132*  K 4.0 3.7  CL 98 96*  CO2 25 26  GLUCOSE 102* 159*  BUN 14 17  CREATININE 1.04 0.86  CALCIUM 9.0 8.9    PT/INR:  Recent Labs    11/21/18 0354  LABPROT 14.7  INR 1.2   ABG    Component Value Date/Time   PHART 7.397 11/20/2018 0412   HCO3 27.5 11/20/2018 0412   TCO2 24 11/20/2018 0409   ACIDBASEDEF 1.0 11/20/2018 0409   O2SAT 55.5 11/21/2018 0340   CBG (last 3)  Recent Labs    11/20/18 2336 11/21/18 0338 11/21/18 0821  GLUCAP 118* 141* 98    Assessment/Plan: S/P Procedure(s) (LRB): INSERTION OF IMPLANTABLE LEFT  VENTRICULAR ASSIST DEVICE (N/A) AORTIC VALVE REPAIR (N/A) TRANSESOPHAGEAL ECHOCARDIOGRAM (TEE) (N/A) Mobilize Diuresis ok for heparin at low dose; consider cardioversion   LOS: 17 days    Keith Nguyen 11/21/2018

## 2018-11-21 NOTE — Telephone Encounter (Signed)
Patient's daughter called to discuss needs for home post discharge from the hospital. Daughter states she just wants to be prepared for any needs ahead of time and get her home ready for when patient is ready for discharge. CSW and daughter spoke about typical needs and assured her that all equipment and dressing supplies are provided by VAD Coordinators at discharge and ongoing. Daughter grateful for the support and appears to be very motivated and supportive of patient. CSW will continue to follow throughout implant hospitalization. Raquel Sarna, [place], [place]

## 2018-11-21 NOTE — Progress Notes (Addendum)
Patient ID: Keith Bidinger., male   DOB: 21-Aug-1950, 68 y.o.   MRN: [id number]   Advanced Heart Failure VAD Team Note  PCP-Cardiologist: Dr. Aundra Dubin    Subjective:    S/P  HMIII LVAD Aortic valve closure. Chest tubes remain in place. Minimal pocket pain. LDH trending down at 257.  MAP 70s currently. Milrinone discontinued yesterday. Digoxin added. Co-ox 56% today.  He was started on losartan overnight due to MAP 90s.   Remains in atrial flutter w/ CVR in the 70s. On IV amio 30 mg/hr.  Diuresing w/ IV Lasix. -2.9L out yesterday. Net negative 8L. CVP 12-13. Wt trending down but remains 18 lb above preop wt. Scr stable.   Out of bed in chair eating breakfast. No complaints.   LVAD INTERROGATION:  HeartMate III LVAD:   Flow 4.0 liters/min, speed 5400, power 3.8, PI 3.6. Multiple PI events overnight.    Objective:    Vital Signs:   Temp:  [97.7 F (36.5 C)-99.2 F (37.3 C)] 97.7 F (36.5 C) (10/30 0400) Pulse Rate:  [69-123] 69 (10/30 0400) Resp:  [12-35] 20 (10/30 0500) BP: (77-117)/(51-91) 77/52 (10/30 0600) SpO2:  [99 %] 99 % (10/30 0400) Arterial Line BP: (93-105)/(76-82) 105/76 (10/29 1300) Weight:  [76 kg] 76 kg (10/30 0500) Last BM Date: 11/15/18 Mean arterial Pressure 90s   Intake/Output:   Intake/Output Summary (Last 24 hours) at 11/21/2018 0702 Last data filed at 11/21/2018 0600 Gross per 24 hour  Intake 2420 ml  Output 2990 ml  Net -570 ml     Physical Exam   Physical Exam: PHYSICAL EXAM: General:  Well appearing middle aged AAM, ICU, multiple drips, No respiratory difficulty HEENT: normal Neck: supple. elevated JVD. Carotids 2+ bilat; no bruits. No lymphadenopathy or thyromegaly appreciated. Cor: LVAD Hum  Lungs: clear Abdomen: soft, nontender, nondistended. No hepatosplenomegaly. No bruits or masses. Good bowel sounds. Extremities: no cyanosis, clubbing, rash, edema Neuro: alert & oriented x 3, cranial nerves grossly intact. moves all 4 extremities  w/o difficulty. Affect pleasant.  Telemetry   Atrial flutter w/ CVR 70s  EKG   n/a  Labs   Basic Metabolic Panel: Recent Labs  Lab 11/18/18 0256  11/18/18 1556  11/19/18 0305 11/19/18 0317 11/20/18 0409 11/20/18 0434 11/21/18 0354  NA 134*   < > 134*   < > 132* 134* 136 132* 132*  K 5.2*   < > 4.4   < > 4.3 4.4 3.6 4.0 3.7  CL 106  --  105  --  103  --   --  98 96*  CO2 21*  --  21*  --  22  --   --  25 26  GLUCOSE 128*  --  116*  --  99  --   --  102* 159*  BUN 15  --  14  --  13  --   --  14 17  CREATININE 1.02  --  1.12  --  1.04  --   --  1.04 0.86  CALCIUM 8.1*  --  8.6*  --  8.9  --   --  9.0 8.9  MG 2.2  --  1.9  --  1.8  --   --  1.9 1.8  PHOS 3.7  --   --   --  3.3  --   --  3.0 3.3   < > = values in this interval not displayed.    Liver Function Tests: Recent Labs  Lab 11/16/18 2146  11/18/18 0256 11/19/18 0305 11/20/18 0434  AST 54* 120* 96* 54*  ALT '31 18 19 12  ' ALKPHOS 66 33* 46 60  BILITOT 0.7 2.6* 3.7* 1.8*  PROT 7.2 5.1* 5.8* 6.2*  ALBUMIN 2.8* 3.2* 3.1* 3.1*   No results for input(s): LIPASE, AMYLASE in the last 168 hours. No results for input(s): AMMONIA in the last 168 hours.  CBC: Recent Labs  Lab 11/16/18 0337  11/18/18 0256  11/18/18 1556  11/19/18 0305 11/19/18 0317 11/20/18 0409 11/20/18 0434 11/21/18 0354  WBC 4.5   < > 8.2  --  14.2*  --  16.5*  --   --  15.1* 9.3  NEUTROABS 2.0  --  6.6  --   --   --  14.2*  --   --  12.7* 7.7  HGB 14.7   < > 7.4*   < > 8.3*   < > 8.4* 9.2* 8.2* 8.4* 7.9*  HCT 43.1   < > 22.0*   < > 24.4*   < > 25.1* 27.0* 24.0* 24.7* 24.1*  MCV 88.5   < > 89.8  --  90.7  --  91.6  --   --  91.5 92.3  PLT 209   < > 119*  --  90*  --  91*  --   --  104* 121*   < > = values in this interval not displayed.    INR: Recent Labs  Lab 11/18/18 0256 11/19/18 0305 11/19/18 1012 11/20/18 0434 11/21/18 0354  INR 1.3* 1.4* 1.5* 1.2 1.2    Other results:  EKG:    Imaging   Dg Chest Port 1 View   Result Date: 11/20/2018 CLINICAL DATA:  Chest tube present EXAM: PORTABLE CHEST 1 VIEW COMPARISON:  Two days ago FINDINGS: LVAD in place. Swan-Ganz catheter has been removed with sheath in stable position. Chest tubes remain. No visible pneumothorax. Mild atelectasis. Stable cardiomegaly and vascular pedicle widening IMPRESSION: Remaining hardware is in stable position. No visible pneumothorax. Stable atelectasis. Electronically Signed   By: Monte Fantasia M.D.   On: 11/20/2018 07:20   [place] Ekg Site Rite  Result Date: 11/20/2018 If Site Rite image not attached, placement could not be confirmed due to current cardiac rhythm.    Medications:     Scheduled Medications: . sodium chloride   Intravenous Once  . acetaminophen  1,000 mg Oral Q6H   Or  . acetaminophen (TYLENOL) oral liquid 160 mg/5 mL  1,000 mg Per Tube Q6H  . allopurinol  100 mg Oral Daily  . aspirin EC  325 mg Oral Daily   Or  . aspirin  324 mg Per Tube Daily   Or  . aspirin  300 mg Rectal Daily  . bisacodyl  10 mg Oral Daily   Or  . bisacodyl  10 mg Rectal Daily  . chlorhexidine gluconate (MEDLINE KIT)  15 mL Mouth Rinse BID  . Chlorhexidine Gluconate Cloth  6 each Topical Daily  . colchicine  0.6 mg Oral Daily  . digoxin  0.125 mg Oral Daily  . docusate sodium  200 mg Oral Daily  . feeding supplement (ENSURE ENLIVE)  237 mL Oral TID BM  . ferrous [id number] C-folic acid  1 capsule Oral BID PC  . furosemide  40 mg Intravenous BID  . insulin aspart  0-24 Units Subcutaneous Q4H  . loratadine  10 mg Oral Daily  . losartan  25 mg Oral Daily  . mouth rinse  15 mL Mouth  Rinse BID  . pantoprazole  40 mg Oral Daily  . potassium chloride  20 mEq Oral Q4H  . sodium chloride flush  10-40 mL Intracatheter Q12H  . sodium chloride flush  10-40 mL Intracatheter Q12H  . spironolactone  12.5 mg Oral Daily  . Warfarin - Pharmacist Dosing Inpatient   Does not apply q1800    Infusions: . sodium chloride Stopped  (11/18/18 1944)  . sodium chloride    . sodium chloride    . sodium chloride    . sodium chloride    . amiodarone 30 mg/hr (11/21/18 0600)  . dexmedetomidine (PRECEDEX) IV infusion Stopped (11/18/18 1001)  . epinephrine Stopped (11/18/18 1828)  . lactated ringers Stopped (11/05/2018 1610)  . norepinephrine (LEVOPHED) Adult infusion 1 mcg/min (11/10/2018 2128)    PRN Medications: sodium chloride, sodium chloride, acetaminophen, albuterol, bisacodyl, bisacodyl, hydrALAZINE, methocarbamol, morphine injection, ondansetron (ZOFRAN) IV, ondansetron **OR** [DISCONTINUED] ondansetron (ZOFRAN) IV, oxyCODONE, sodium chloride, sodium chloride flush, sodium chloride flush, traMADol     Assessment/Plan:     1. S/P HMIII DT: Extubated. Off epinephrine and milrinone, remains on low dose NE with stable MAP.  Hgb down with expected blood loss. Received 1UPRBC 10/27. Chest tubes remains in place. LDH trending down 320>>257 -INR 1.2  - Continue ASA 325 until INR 2 then will drop to 81 mg daily.   - Warfarin begun. May need to start IV heparin.  2. Acute on chronic systolic CHF: Nonischemic cardiomyopathy diagnosed in 2017 with echo showing EF 40% and LHC showing mild nonobstructive CAD. He saw Dr. Wynonia Lawman in the past, but no cardiology evaluation since 2017. 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 He has drunk moderate ETOH in the past (no longer drinks) but does not seem to have drunk enough to cause a cardiomyopathy. He was markedly volume overloaded on exam initially with biventricular failure and NYHA class IV at admission with rise in creatinine concerning for cardiorenal syndrome. Initial co-ox 40% suggested low CO, milrinone started and increased to 0.375.    RHC showed R>L heart failure.  Cardiac output looked good on milrinone by Fick but was low  by thermodilution (discordant values).  Repeat LHC/RHC showed very low filling pressures and good cardiac index by Fick.  No significant coronary disease noted.  S/P HMIII LVAD as above.  - CO-OX 55% today. Milrinone discontinued. On digoxin 0.125 mg daily.  -Diuresing w/ IV Lasix. -2.9L out yesterday. Net negative 8L. CVP 12-13. Wt trending down but remains 18 lb above preop wt. Scr stable. K 3.7.  -Continue  IV Lasix 40 mg twice a day.   -Continue losartan 25 mg daily -Continue spironolactone 12.5 mg daily -Continue digoxin 0.125 mg daily  2. AKI on CKD stage 3: Suspect this may be a combination of cardiorenal syndrome and contrast nephropathy (had CTA chest at admission). Minimal contrast with coronary angiography.  Creatinine stable today at 0.86. Monitor w/ diuresis  3. 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 He was drinking moderate ETOH as well but has quit completely for about 1 month. Childs A per GI.  4. COPD/smoking: He recently quit smoking.CT chest with moderate emphysema. He is now off oxygen. Minimal obstruction on PFTs. 5. Atrial flutter/fibrillation: Appears to be in atrial flutter with CVR in the 70s today.   - Continue IV amiodarone  load.  -Keep K >4.0 and Mg >2.0. will supplement (K 3.7 and Mg 1.8 today)  6. Hyponatremia:  -Sodium 132 7. Aortic insufficiency: ?Moderate range. S/p AV closure with LVAD 11/07/2018     Length of Stay: [address], PA-C 11/21/2018, 7:02 AM  VAD Team --- VAD ISSUES ONLY--- Pager [phone] (7am - 7am)  Advanced Heart Failure Team  Pager [phone] (M-F; 7a - 4p)  Please contact [facility] for night-coverage after hours (4p -7a ) and weekends on [email or web address]  Patient seen with PA, agree with the above note.   He is feeling well this morning, did some walking yesterday.  MAP 90s overnight and losartan added but in 70s this morning and had PI events overnight.  CVP remans 12-13 with  weight significantly up from baseline still.  He is off all gtts except for amiodarone, remains in atrial flutter but rate is now controlled.   General: Well appearing this am. NAD.  HEENT: Normal. Neck: Supple, JVP 12 cm. Carotids OK.  Cardiac:  Mechanical heart sounds with LVAD hum present.  Lungs:  CTAB, normal effort.  Abdomen:  NT, ND, no HSM. No bruits or masses. +BS  LVAD exit site: Well-healed and incorporated. Dressing dry and intact. No erythema or drainage. Stabilization device present and accurately applied. Driveline dressing changed daily per sterile technique. Extremities:  Warm and dry. No cyanosis, clubbing, rash, or edema.  Neuro:  Alert & oriented x 3. Cranial nerves grossly intact. Moves all 4 extremities w/o difficulty. Affect pleasant    Atrial flutter rate is now controlled in 70s, continue amiodarone for now.  Eventual TEE-guided DCCV if he does not convert on his own eventually.   Patient remains volume overloaded post-op. Increase Lasix to 60 mg IV bid.   Stop losartan for now with MAP in 70s, can continue spironolactone.   Co-ox stable off milrinone.  He is on digoxin.   INR 1.2 today, he is on warfarin.  Start heparin gtt if surgery agrees.   CRITICAL CARE Performed by: Loralie Champagne  Total critical care time: 35 minutes  Critical care time was exclusive of separately billable procedures and treating other patients.  Critical care was necessary to treat or prevent imminent or life-threatening deterioration.  Critical care was time spent personally by me on the following activities: development of treatment plan with patient and/or surrogate as well as nursing, discussions with consultants, evaluation of patient's response to treatment, examination of patient, obtaining history from patient or surrogate, ordering and performing treatments and interventions, ordering and review of laboratory studies, ordering and review of radiographic studies, pulse oximetry  and re-evaluation of patient's condition.

## 2018-11-21 NOTE — Progress Notes (Signed)
Physical Therapy Treatment Patient Details Name: Keith Nguyen. MRN: [id number] DOB: [birth year] Today's Date: 11/21/2018    History of Present Illness Pt is a 68 y.o. male admitted 10/31/2018 with SOB, worked up for CHF. S/p cardiac cath 10/15 and 10/20. S/p HM3 LVAD placement 10/26. PMH includes Hep C, HTN, HLD.   PT Comments    Pt progressing very well with mobility. Requires modA to boost into standing, limited by sternal precautions; hallway ambulation (320') with eva walker and min guard for balance. Pt able to initiate switching LVAD from battery to wall power with cues from therapist. Encouraged continued ambulation with nursing staff today and over weekend. Pt motivated to participate.    Follow Up Recommendations  No PT follow up;Supervision for mobility/OOB(pending progression)     Equipment Recommendations  (TBD)    Recommendations for Other Services       Precautions / Restrictions Precautions Precautions: Sternal;Fall;Other (comment) Precaution Comments: LVAD, chest tube, 3L O2 Restrictions Weight Bearing Restrictions: Yes(sternal precaution)    Mobility  Bed Mobility               General bed mobility comments: Received sitting in recliner  Transfers Overall transfer level: Needs assistance Equipment used: 4-wheeled walker(eva walker) Transfers: Sit to/from Stand Sit to Stand: Mod assist         General transfer comment: Cues for technique with sternal precautions; modA to assist trunk elevation from recliner with pt's hands on knees; pt with difficulty getting adequate momentum to stand  Ambulation/Gait Ambulation/Gait assistance: Min guard Gait Distance (Feet): 320 Feet Assistive device: 4-wheeled walker(eva walker) Gait Pattern/deviations: Step-through pattern;Decreased stride length;Trunk flexed Gait velocity: Decreased Gait velocity interpretation: 1.31 - 2.62 ft/sec, indicative of limited community ambulator General Gait Details: Slow,  steady gait with eva walker and min guard for safety; no standing rest breaks required; HR 70s-90s, 3L O2 [place]   Stairs             Wheelchair Mobility    Modified Rankin (Stroke Patients Only)       Balance Overall balance assessment: Needs assistance Sitting-balance support: Feet supported;No upper extremity supported Sitting balance-Leahy Scale: Fair     Standing balance support: During functional activity Standing balance-Leahy Scale: Fair Standing balance comment: Can static stand without UE support                            Cognition Arousal/Alertness: Awake/alert Behavior During Therapy: WFL for tasks assessed/performed Overall Cognitive Status: Within Functional Limits for tasks assessed                                        Exercises      General Comments General comments (skin integrity, edema, etc.): PT switched pt to battery power at beginning of session; patient able to switch from battery to wall paper with verbal cues from PT; reinforced LVAD education throughout session      Pertinent Vitals/Pain Pain Assessment: No/denies pain    Home Living                      Prior Function            PT Goals (current goals can now be found in the care plan section) Progress towards PT goals: Progressing toward goals    Frequency    Min 3X/week  PT Plan Current plan remains appropriate    Co-evaluation              AM-PAC PT "6 Clicks" Mobility   Outcome Measure  Help needed turning from your back to your side while in a flat bed without using bedrails?: A Little Help needed moving from lying on your back to sitting on the side of a flat bed without using bedrails?: A Little Help needed moving to and from a bed to a chair (including a wheelchair)?: A Little Help needed standing up from a chair using your arms (e.g., wheelchair or bedside chair)?: A Lot Help needed to walk in hospital room?: A  Little Help needed climbing 3-5 steps with a railing? : A Little 6 Click Score: 17    End of Session Equipment Utilized During Treatment: Gait belt Activity Tolerance: Patient tolerated treatment well Patient left: in chair;with call bell/phone within reach Nurse Communication: Mobility status PT Visit Diagnosis: Other abnormalities of gait and mobility (R26.89);Difficulty in walking, not elsewhere classified (R26.2)     Time: [id number] PT Time Calculation (min) (ACUTE ONLY): 42 min  Charges:  $Gait Training: 8-22 mins $Therapeutic Activity: 23-37 mins                    Mabeline Caras, PT, DPT Acute Rehabilitation Services  Pager [phone] Office [place] 11/21/2018, 11:07 AM

## 2018-11-22 ENCOUNTER — Inpatient Hospital Stay (HOSPITAL_COMMUNITY): Payer: 59

## 2018-11-22 DIAGNOSIS — I5023 Acute on chronic systolic (congestive) heart failure: Secondary | ICD-10-CM | POA: Diagnosis not present

## 2018-11-22 DIAGNOSIS — Z95811 Presence of heart assist device: Secondary | ICD-10-CM | POA: Diagnosis not present

## 2018-11-22 LAB — CBC WITH DIFFERENTIAL/PLATELET
Abs Immature Granulocytes: 0.06 10*3/uL (ref 0.00–0.07)
Basophils Absolute: 0 10*3/uL (ref 0.0–0.1)
Basophils Relative: 0 %
Eosinophils Absolute: 0.1 10*3/uL (ref 0.0–0.5)
Eosinophils Relative: 2 %
HCT: 24.5 % — ABNORMAL LOW (ref 39.0–52.0)
Hemoglobin: 8.1 g/dL — ABNORMAL LOW (ref 13.0–17.0)
Immature Granulocytes: 1 %
Lymphocytes Relative: 13 %
Lymphs Abs: 1.1 10*3/uL (ref 0.7–4.0)
MCH: 30.8 pg (ref 26.0–34.0)
MCHC: 33.1 g/dL (ref 30.0–36.0)
MCV: 93.2 fL (ref 80.0–100.0)
Monocytes Absolute: 0.6 10*3/uL (ref 0.1–1.0)
Monocytes Relative: 7 %
Neutro Abs: 6.5 10*3/uL (ref 1.7–7.7)
Neutrophils Relative %: 77 %
Platelets: 162 10*3/uL (ref 150–400)
RBC: 2.63 MIL/uL — ABNORMAL LOW (ref 4.22–5.81)
RDW: 16.9 % — ABNORMAL HIGH (ref 11.5–15.5)
WBC: 8.4 10*3/uL (ref 4.0–10.5)
nRBC: 0 % (ref 0.0–0.2)

## 2018-11-22 LAB — HEPARIN LEVEL (UNFRACTIONATED)
Heparin Unfractionated: 0.51 IU/mL (ref 0.30–0.70)
Heparin Unfractionated: 0.14 IU/mL — ABNORMAL LOW (ref 0.30–0.70)
Heparin Unfractionated: 0.11 IU/mL — ABNORMAL LOW (ref 0.30–0.70)

## 2018-11-22 LAB — COOXEMETRY PANEL
Carboxyhemoglobin: 1.5 % (ref 0.5–1.5)
Methemoglobin: 0.9 % (ref 0.0–1.5)
O2 Saturation: 57 %
Total hemoglobin: 8.3 g/dL — ABNORMAL LOW (ref 12.0–16.0)

## 2018-11-22 LAB — GLUCOSE, CAPILLARY
Glucose-Capillary: 94 mg/dL (ref 70–99)
Glucose-Capillary: 116 mg/dL — ABNORMAL HIGH (ref 70–99)
Glucose-Capillary: 113 mg/dL — ABNORMAL HIGH (ref 70–99)
Glucose-Capillary: 97 mg/dL (ref 70–99)
Glucose-Capillary: 95 mg/dL (ref 70–99)

## 2018-11-22 LAB — BASIC METABOLIC PANEL
Anion gap: 9 (ref 5–15)
BUN: 20 mg/dL (ref 8–23)
CO2: 28 mmol/L (ref 22–32)
Calcium: 9 mg/dL (ref 8.9–10.3)
Chloride: 95 mmol/L — ABNORMAL LOW (ref 98–111)
Creatinine, Ser: 0.97 mg/dL (ref 0.61–1.24)
GFR calc Af Amer: >60 mL/min (ref >60)
GFR calc non Af Amer: >60 mL/min (ref >60)
Glucose, Bld: 103 mg/dL — ABNORMAL HIGH (ref 70–99)
Potassium: 4.1 mmol/L (ref 3.5–5.1)
Sodium: 132 mmol/L — ABNORMAL LOW (ref 135–145)

## 2018-11-22 LAB — PROTIME-INR
INR: 1.3 — ABNORMAL HIGH (ref 0.8–1.2)
Prothrombin Time: 15.7 seconds — ABNORMAL HIGH (ref 11.4–15.2)

## 2018-11-22 LAB — LACTATE DEHYDROGENASE: LDH: 229 U/L — ABNORMAL HIGH (ref 98–192)

## 2018-11-22 LAB — MAGNESIUM: Magnesium: 2 mg/dL (ref 1.7–2.4)

## 2018-11-22 LAB — PHOSPHORUS: Phosphorus: 2.8 mg/dL (ref 2.5–4.6)

## 2018-11-22 MED ORDER — SODIUM CHLORIDE 0.9% FLUSH: 3.0000 mL | Freq: Two times a day (BID) | INTRAVENOUS | Status: DC

## 2018-11-22 MED ORDER — ~~LOC~~ CARDIAC SURGERY, PATIENT & FAMILY EDUCATION
Freq: Once | Status: AC
  Administered 2018-11-22: 11:00:00

## 2018-11-22 MED ORDER — METOLAZONE 2.5 MG PO TABS
2.5000 mg | ORAL_TABLET | Freq: Once | ORAL | Status: AC
  Administered 2018-11-22: 09:00:00 2.5 mg via ORAL
  Filled 2018-11-22: qty 1

## 2018-11-22 MED ORDER — WARFARIN SODIUM 5 MG PO TABS
5.0000 mg | ORAL_TABLET | Freq: Once | ORAL | Status: AC
  Administered 2018-11-22: 17:00:00 5 mg via ORAL
  Filled 2018-11-22: qty 1

## 2018-11-22 MED ORDER — SODIUM CHLORIDE 0.9% FLUSH: 3.0000 mL | INTRAVENOUS | Status: DC | PRN

## 2018-11-22 MED ORDER — SODIUM CHLORIDE 0.9 % IV SOLN: 250.0000 mL | INTRAVENOUS | Status: DC | PRN

## 2018-11-22 NOTE — Progress Notes (Signed)
ANTICOAGULATION CONSULT NOTE  Pharmacy Consult:  Heparin Indication: LVAD  Allergies  Allergen Reactions  . Lipitor [Atorvastatin]     Elevated liver enzymes    Patient Measurements: Height: 5\' 11"  (180.3 cm) Weight: 166 lb 14.2 oz (75.7 kg) IBW/kg (Calculated) : 75.3  Heparin dosing weight: 75 kg  Vital Signs: Temp: 98.6 F (37 C) (10/31 1528) Temp Source: Oral (10/31 1528) BP: 97/79 (10/31 1528) Pulse Rate: 74 (10/31 1528)  Labs: Recent Labs    11/20/18 0434 11/21/18 0354  11/22/18 0026 11/22/18 0421 11/22/18 1715  HGB 8.4* 7.9*  --   --  8.1*  --   HCT 24.7* 24.1*  --   --  24.5*  --   PLT 104* 121*  --   --  162  --   LABPROT 15.3* 14.7  --   --  15.7*  --   INR 1.2 1.2  --   --  1.3*  --   HEPARINUNFRC  --   --    < > 0.51 0.14* 0.11*  CREATININE 1.04 0.86  --   --  0.97  --    < > = values in this interval not displayed.    Estimated Creatinine Clearance: 76.6 mL/min (by C-G formula based on SCr of 0.97 mg/dL).   Assessment: 68 year old male s/p HM-3 implant with aortic valve closure 10/26. Warfarin started on 10/27. Patient with afib w/ RVR and amiodarone PO switched to gtt.  Pharmacy also consulted to dose IV heparin.  Heparin level remain sub-therapeutic; no issue with heparin infusion per RN.  Chest tube output remains bloody.  TCTS okay with increasing heparin.  Goal of Therapy:  INR goal 2-2.5  Heparin level 0.3-0.5 units/mL Monitor platelets by anticoagulation protocol: Yes   Plan:  Increase heparin to 1100 units/hr Recheck heparin level in 6 hours Will need to follow up with TCTS prior to rate increases on heparin drip  Kendrick Remigio D. Mina Marble, PharmD, BCPS, [place] 11/22/2018, 7:38 PM

## 2018-11-22 NOTE — Progress Notes (Signed)
Pt got transferred from [place] this evening with Heart mate 3, vitals stable, pt denies pain, comfortably resting in a recliner, CVP is 2 cm, Amiodarone and Heparin IV continue, wife is in bed side and updated, will continue to monitor the patient  Palma Holter, RN

## 2018-11-22 NOTE — Progress Notes (Signed)
ANTICOAGULATION CONSULT NOTE - Follow Up Consult  Pharmacy Consult for warfarin + heparin Indication: LVAD  Allergies  Allergen Reactions  . Lipitor [Atorvastatin]     Elevated liver enzymes    Patient Measurements: Height: 5\' 11"  (180.3 cm) Weight: 166 lb 14.2 oz (75.7 kg) IBW/kg (Calculated) : 75.3  Heparin dosing weight: 75 kg  Vital Signs: Temp: 98.4 F (36.9 C) (10/31 0430) Temp Source: Oral (10/31 0430) BP: 107/75 (10/31 0700) Pulse Rate: 32 (10/31 0700)  Labs: Recent Labs    11/20/18 0434 11/21/18 0354 11/21/18 1454 11/22/18 0026 11/22/18 0421  HGB 8.4* 7.9*  --   --  8.1*  HCT 24.7* 24.1*  --   --  24.5*  PLT 104* 121*  --   --  162  LABPROT 15.3* 14.7  --   --  15.7*  INR 1.2 1.2  --   --  1.3*  HEPARINUNFRC  --   --  <0.10* 0.51 0.14*  CREATININE 1.04 0.86  --   --  0.97    Estimated Creatinine Clearance: 76.6 mL/min (by C-G formula based on SCr of 0.97 mg/dL).   Medical History: Past Medical History:  Diagnosis Date  . Abnormal cardiac function test 09/16/2015  . Aortic atherosclerosis ([place]) 09/16/2015  . History of hepatitis C 09/16/2015   Prior treatment with Harvoni   . Hyperlipidemia 09/16/2015  . Hypertensive heart disease without CHF 09/16/2015   Assessment: 68 year old male s/p HM-3 implant with aortic valve closure 10/26. Warfarin started on 10/27. Patient with afib w/ RVR and amiodarone PO switched to gtt. HR 70s this AM. Drinking Ensure supplements as well averaging about 3 per day. Will need to watch INR closely.   INR today is 1.3, Hgb stable 8.1, pltc 162, LDH stable 200s.  Heparin level this morning is 0.14. Chest tube output not increasing, staying consistent, per RN report. Spoke to Dr. Kipp Brood, okay to increase heparin rate and monitor CT output. Will target lower heparin goal for now.  Goal of Therapy:  INR goal 2-2.5  Heparin level 0.3-0.5 Monitor platelets by anticoagulation protocol: Yes   Plan:  Increase heparin to 900  units/hr Recheck heparin level in 6 hours Will need to follow up with TCTS prior to rate increases on heparin drip Warfarin 5 mg x 1 today Daily INR, heparin level, CBC Decrease aspirin to 81mg  when INR closer to 2  Vertis Kelch, PharmD PGY2 Cardiology Pharmacy Resident Phone [phone] 11/22/2018       8:53 AM  Please check [email or web address] for unit-specific pharmacist phone numbers

## 2018-11-22 NOTE — Progress Notes (Addendum)
Patient ID: Keith Nguyen., male   DOB: [birth year], 68 y.o.   MRN: [id number]   Advanced Heart Failure VAD Team Note  PCP-Cardiologist: Dr. Aundra Dubin    Subjective:    S/P  HMIII LVAD Aortic valve closure. Chest tubes remain in place. Minimal pocket pain. LDH trending down at 257.  MAP 80s.  CVP 11-12 today.  Weight down 1 lb with IV Lasix, still well above pre-op weight.   Remains in atrial flutter w/rate in the 70s. On IV amio 30 mg/hr.  Out of bed in chair eating breakfast. No complaints. Has done well with walks.   LVAD INTERROGATION:  HeartMate III LVAD:   Flow 3.9 liters/min, speed 5400, power 4, PI 4.8. Multiple PI events overnight.    Objective:    Vital Signs:   Temp:  [97.5 F (36.4 C)-98.4 F (36.9 C)] 98.4 F (36.9 C) (10/31 0430) Pulse Rate:  [32-79] 32 (10/31 0700) Resp:  [14-33] 20 (10/31 0700) BP: (80-116)/(36-95) 107/75 (10/31 0700) SpO2:  [84 %-100 %] 84 % (10/31 0700) Weight:  [75.7 kg] 75.7 kg (10/31 0655) Last BM Date: 11/15/18 Mean arterial Pressure 80s   Intake/Output:   Intake/Output Summary (Last 24 hours) at 11/22/2018 0819 Last data filed at 11/22/2018 0700 Gross per 24 hour  Intake 1300.66 ml  Output 1997 ml  Net -696.34 ml     Physical Exam   General: Well appearing this am. NAD.  HEENT: Normal. Neck: Supple, JVP 12 cm. Carotids OK.  Cardiac:  Mechanical heart sounds with LVAD hum present.  Lungs:  CTAB, normal effort.  Abdomen:  NT, ND, no HSM. No bruits or masses. +BS  LVAD exit site: Well-healed and incorporated. Dressing dry and intact. No erythema or drainage. Stabilization device present and accurately applied. Driveline dressing changed daily per sterile technique. Extremities:  Warm and dry. No cyanosis, clubbing, rash, or edema.  Neuro:  Alert & oriented x 3. Cranial nerves grossly intact. Moves all 4 extremities w/o difficulty. Affect pleasant     Telemetry   Atrial flutter w/rate 70s  EKG   n/a  Labs   Basic  Metabolic Panel: Recent Labs  Lab 11/18/18 0256  11/18/18 1556  11/19/18 0305 11/19/18 0317 11/20/18 0409 11/20/18 0434 11/21/18 0354 11/22/18 0421  NA 134*   < > 134*   < > 132* 134* 136 132* 132* 132*  K 5.2*   < > 4.4   < > 4.3 4.4 3.6 4.0 3.7 4.1  CL 106  --  105  --  103  --   --  98 96* 95*  CO2 21*  --  21*  --  22  --   --  '25 26 28  ' GLUCOSE 128*  --  116*  --  99  --   --  102* 159* 103*  BUN 15  --  14  --  13  --   --  '14 17 20  ' CREATININE 1.02  --  1.12  --  1.04  --   --  1.04 0.86 0.97  CALCIUM 8.1*  --  8.6*  --  8.9  --   --  9.0 8.9 9.0  MG 2.2  --  1.9  --  1.8  --   --  1.9 1.8 2.0  PHOS 3.7  --   --   --  3.3  --   --  3.0 3.3 2.8   < > = values in this interval not displayed.  Liver Function Tests: Recent Labs  Lab 11/16/18 2146 11/18/18 0256 11/19/18 0305 11/20/18 0434  AST 54* 120* 96* 54*  ALT '31 18 19 12  ' ALKPHOS 66 33* 46 60  BILITOT 0.7 2.6* 3.7* 1.8*  PROT 7.2 5.1* 5.8* 6.2*  ALBUMIN 2.8* 3.2* 3.1* 3.1*   No results for input(s): LIPASE, AMYLASE in the last 168 hours. No results for input(s): AMMONIA in the last 168 hours.  CBC: Recent Labs  Lab 11/18/18 0256  11/18/18 1556  11/19/18 0305 11/19/18 0317 11/20/18 0409 11/20/18 0434 11/21/18 0354 11/22/18 0421  WBC 8.2  --  14.2*  --  16.5*  --   --  15.1* 9.3 8.4  NEUTROABS 6.6  --   --   --  14.2*  --   --  12.7* 7.7 6.5  HGB 7.4*   < > 8.3*   < > 8.4* 9.2* 8.2* 8.4* 7.9* 8.1*  HCT 22.0*   < > 24.4*   < > 25.1* 27.0* 24.0* 24.7* 24.1* 24.5*  MCV 89.8  --  90.7  --  91.6  --   --  91.5 92.3 93.2  PLT 119*  --  90*  --  91*  --   --  104* 121* 162   < > = values in this interval not displayed.    INR: Recent Labs  Lab 11/19/18 0305 11/19/18 1012 11/20/18 0434 11/21/18 0354 11/22/18 0421  INR 1.4* 1.5* 1.2 1.2 1.3*    Other results:  EKG:    Imaging   Dg Chest Port 1 View  Result Date: 11/22/2018 CLINICAL DATA:  Status post left ventricular assist device  placement on 10/29/2018. EXAM: PORTABLE CHEST 1 VIEW COMPARISON:  11/20/2018 FINDINGS: The left ventricular assist device is unchanged. Interval right PICC with its tip in the region of the superior cavoatrial junction. A small diameter catheter remains projected over the left ventricle. Stable enlarged cardiac silhouette and tortuous and calcified thoracic aorta. Median sternotomy wires are again demonstrated. The right jugular Swan-Ganz catheter sheath has been removed. No significant change in mild linear atelectasis at the left lung base. The right lung remains clear. No acute bony abnormality. IMPRESSION: 1. Stable cardiomegaly and mild left basilar atelectasis. 2. Stable left ventricular assist device with a small diameter catheter overlying the left ventricle. 3. No acute abnormality. Electronically Signed   By: Claudie Revering M.D.   On: 11/22/2018 08:00   [place] Ekg Site Rite  Result Date: 11/20/2018 If Site Rite image not attached, placement could not be confirmed due to current cardiac rhythm.    Medications:     Scheduled Medications: . sodium chloride   Intravenous Once  . acetaminophen  1,000 mg Oral Q6H   Or  . acetaminophen (TYLENOL) oral liquid 160 mg/5 mL  1,000 mg Per Tube Q6H  . allopurinol  100 mg Oral Daily  . aspirin EC  325 mg Oral Daily   Or  . aspirin  324 mg Per Tube Daily   Or  . aspirin  300 mg Rectal Daily  . bisacodyl  10 mg Oral Daily   Or  . bisacodyl  10 mg Rectal Daily  . chlorhexidine gluconate (MEDLINE KIT)  15 mL Mouth Rinse BID  . Chlorhexidine Gluconate Cloth  6 each Topical Daily  . colchicine  0.6 mg Oral Daily  . digoxin  0.125 mg Oral Daily  . docusate sodium  200 mg Oral Daily  . feeding supplement (ENSURE ENLIVE)  237 mL Oral  TID BM  . ferrous [id number] C-folic acid  1 capsule Oral BID PC  . furosemide  60 mg Intravenous BID  . insulin aspart  0-24 Units Subcutaneous Q4H  . loratadine  10 mg Oral Daily  . mouth rinse  15 mL Mouth  Rinse BID  . metolazone  2.5 mg Oral Once  . pantoprazole  40 mg Oral Daily  . polyethylene glycol  17 g Oral Daily  . sodium chloride flush  10-40 mL Intracatheter Q12H  . sodium chloride flush  10-40 mL Intracatheter Q12H  . spironolactone  12.5 mg Oral Daily  . Warfarin - Pharmacist Dosing Inpatient   Does not apply q1800    Infusions: . sodium chloride Stopped (11/18/18 1944)  . sodium chloride    . sodium chloride    . sodium chloride    . sodium chloride    . amiodarone 30 mg/hr (11/22/18 0700)  . dexmedetomidine (PRECEDEX) IV infusion Stopped (11/18/18 1001)  . epinephrine Stopped (11/18/18 1828)  . heparin 700 Units/hr (11/22/18 0700)  . lactated ringers Stopped (10/24/2018 1610)  . norepinephrine (LEVOPHED) Adult infusion 1 mcg/min (11/12/2018 2128)    PRN Medications: sodium chloride, sodium chloride, acetaminophen, albuterol, bisacodyl, bisacodyl, hydrALAZINE, methocarbamol, morphine injection, ondansetron (ZOFRAN) IV, ondansetron **OR** [DISCONTINUED] ondansetron (ZOFRAN) IV, oxyCODONE, sodium chloride, sodium chloride flush, sodium chloride flush, traMADol     Assessment/Plan:     1. S/P HMIII DT: Extubated. Off epinephrine and milrinone, remains on low dose NE with stable MAP.  Hgb down with expected blood loss. Received 1UPRBC 10/27. Chest tubes remains in place. LDH trending down 320>257>229. INR 1.3.  - Continue ASA 325 until INR 2 then will drop to 81 mg daily.   - Continue heparin gtt + warfarin until INR 2.   2. Acute on chronic systolic CHF: Nonischemic cardiomyopathy diagnosed in 2017 with echo showing EF 40% and LHC showing mild nonobstructive CAD. He saw Dr. Wynonia Lawman in the past, but no cardiology evaluation since 2017. 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 He has drunk moderate ETOH in the past (no  longer drinks) but does not seem to have drunk enough to cause a cardiomyopathy. He was markedly volume overloaded on exam initially with biventricular failure and NYHA class IV at admission with rise in creatinine concerning for cardiorenal syndrome. Initial co-ox 40% suggested low CO, milrinone started and increased to 0.375.    RHC showed R>L heart failure.  Cardiac output looked good on milrinone by Fick but was low by thermodilution (discordant values).  Repeat LHC/RHC showed very low filling pressures and good cardiac index by Fick.  No significant coronary disease noted.  S/P HMIII LVAD as above.  No co-ox today. CVP 11-12, weight down 1 lb but still well above baseline.  - Continue digoxin 0.125 mg daily.  - Continue Lasix 60 mg IV and will give 1 dose of metolazone with K replacement.   - Continue spironolactone 12.5 mg daily 2. AKI on CKD stage 3: Suspect this may be a combination of cardiorenal syndrome and contrast nephropathy (had CTA chest at admission). Minimal contrast with coronary angiography.  Creatinine stable today at 0.97. Monitor w/ diuresis  3. 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 He was drinking moderate ETOH as well but has quit completely for about 1 month. Wilmer Floor  per GI.  4. COPD/smoking: He recently quit smoking.CT chest with moderate emphysema. He is now off oxygen. Minimal obstruction on PFTs. 5. Atrial flutter/fibrillation: Appears to be in atrial flutter with rate in the 70s today.   - Continue IV amiodarone load.  - Keep K >4.0 and Mg >2.0.  - TEE-guided DCCV prior to discharge if he stays in flutter.  6. Hyponatremia: Sodium 132.  7. Aortic insufficiency: ?Moderate range. S/p AV closure with LVAD 11/04/2018   Think he could go to [facility] if surgery agrees.   CRITICAL CARE Performed by: Loralie Champagne  Total critical care time: 35 minutes  Critical care time was exclusive of separately billable procedures and  treating other patients.  Critical care was necessary to treat or prevent imminent or life-threatening deterioration.  Critical care was time spent personally by me on the following activities: development of treatment plan with patient and/or surrogate as well as nursing, discussions with consultants, evaluation of patient's response to treatment, examination of patient, obtaining history from patient or surrogate, ordering and performing treatments and interventions, ordering and review of laboratory studies, ordering and review of radiographic studies, pulse oximetry and re-evaluation of patient's condition.   Length of Stay: [place], MD 11/22/2018, 8:19 AM  VAD Team --- VAD ISSUES ONLY--- Pager [phone] (7am - 7am)  Advanced Heart Failure Team  Pager [phone] (M-F; 7a - 4p)  Please contact [facility] for night-coverage after hours (4p -7a ) and weekends on [email or web address]

## 2018-11-22 NOTE — Progress Notes (Signed)
      [address]       [place] [address]             [phone]                 5 Days Post-Op Procedure(s) (LRB): INSERTION OF IMPLANTABLE LEFT VENTRICULAR ASSIST DEVICE (N/A) AORTIC VALVE REPAIR (N/A) TRANSESOPHAGEAL ECHOCARDIOGRAM (TEE) (N/A)   Events: Doing well.  Up to chair _______________________________________________________________ Vitals: BP 107/75   Pulse 81   Temp 98.2 F (36.8 C) (Oral)   Resp 20   Ht 5\' 11"  (1.803 m)   Wt 75.7 kg   SpO2 (!) 84%   BMI 23.28 kg/m   - Neuro: pain controlled  - Cardiovascular: flutter, rate controlled  Drips: amio  CVP:  [8 mmHg-15 mmHg] 11 mmHg LVAD INTERROGATION:  HeartMate III LVAD:   Flow 3.9 liters/min, speed 5400, power 4, PI 4.8. Multiple PI events overnight.   - Pulm: EWOB    ABG    Component Value Date/Time   PHART 7.397 11/20/2018 0412   PCO2ART 45.7 11/20/2018 0412   PO2ART 46.2 (L) 11/20/2018 0412   HCO3 27.5 11/20/2018 0412   TCO2 24 11/20/2018 0409   ACIDBASEDEF 1.0 11/20/2018 0409   O2SAT 57.0 11/22/2018 0630    - Abd: soft, ND - Extremity: trace edema  .Intake/Output      10/30 0701 - 10/31 0700 10/31 0701 - 11/01 0700   P.O. 720    I.V. (mL/kg) 525.6 (6.9)    Other     IV Piggyback 55.1    Total Intake(mL/kg) 1300.7 (17.2)    Urine (mL/kg/hr) 1835 (1)    Chest Tube 202    Total Output 2037    Net -736.3            _______________________________________________________________ Labs: CBC Latest Ref Rng & Units 11/22/2018 11/21/2018 11/20/2018  WBC 4.0 - 10.5 K/uL 8.4 9.3 15.1(H)  Hemoglobin 13.0 - 17.0 g/dL 8.1(L) 7.9(L) 8.4(L)  Hematocrit 39.0 - 52.0 % 24.5(L) 24.1(L) 24.7(L)  Platelets 150 - 400 K/uL 162 121(L) 104(L)   CMP Latest Ref Rng & Units 11/22/2018 11/21/2018 11/20/2018  Glucose 70 - 99 mg/dL 103(H) 159(H) 102(H)  BUN 8 - 23 mg/dL 20 17 14   Creatinine 0.61 - 1.24 mg/dL 0.97 0.86 1.04  Sodium 135 - 145 mmol/L 132(L) 132(L) 132(L)  Potassium  3.5 - 5.1 mmol/L 4.1 3.7 4.0  Chloride 98 - 111 mmol/L 95(L) 96(L) 98  CO2 22 - 32 mmol/L 28 26 25   Calcium 8.9 - 10.3 mg/dL 9.0 8.9 9.0  Total Protein 6.5 - 8.1 g/dL - - 6.2(L)  Total Bilirubin 0.3 - 1.2 mg/dL - - 1.8(H)  Alkaline Phos 38 - 126 U/L - - 60  AST 15 - 41 U/L - - 54(H)  ALT 0 - 44 U/L - - 12    CXR: clear  _______________________________________________________________  Assessment and Plan: POD 5 s/p HM3 LVAD insertion  Neuro: pain controlled CV: stable flows.  In aflutter, on amio gtt.  Cards considering cardioversion at some point Pulm: continue pulm toilet Renal: making urine. Creat stable GI: tolerating diet Heme: hgb, and plt stable.  INR 1.3.  On hep gtt ID: [email or web address] Endo: on SSI Dispo: TC  Melodie Bouillon, MD 11/22/2018 9:48 AM

## 2018-11-23 ENCOUNTER — Encounter (HOSPITAL_COMMUNITY): Admission: EM | Disposition: E | Payer: Self-pay | Source: Home / Self Care | Attending: Cardiothoracic Surgery

## 2018-11-23 ENCOUNTER — Inpatient Hospital Stay (HOSPITAL_COMMUNITY): Payer: 59

## 2018-11-23 LAB — CBC WITH DIFFERENTIAL/PLATELET
Abs Immature Granulocytes: 0.07 10*3/uL (ref 0.00–0.07)
Basophils Absolute: 0 10*3/uL (ref 0.0–0.1)
Basophils Relative: 0 %
Eosinophils Absolute: 0 10*3/uL (ref 0.0–0.5)
Eosinophils Relative: 0 %
HCT: 24.2 % — ABNORMAL LOW (ref 39.0–52.0)
Hemoglobin: 8.3 g/dL — ABNORMAL LOW (ref 13.0–17.0)
Immature Granulocytes: 1 %
Lymphocytes Relative: 8 %
Lymphs Abs: 0.9 10*3/uL (ref 0.7–4.0)
MCH: 30.5 pg (ref 26.0–34.0)
MCHC: 34.3 g/dL (ref 30.0–36.0)
MCV: 89 fL (ref 80.0–100.0)
Monocytes Absolute: 0.8 10*3/uL (ref 0.1–1.0)
Monocytes Relative: 8 %
Neutro Abs: 8.5 10*3/uL — ABNORMAL HIGH (ref 1.7–7.7)
Neutrophils Relative %: 83 %
Platelets: 207 10*3/uL (ref 150–400)
RBC: 2.72 MIL/uL — ABNORMAL LOW (ref 4.22–5.81)
RDW: 16.3 % — ABNORMAL HIGH (ref 11.5–15.5)
WBC: 10.3 10*3/uL (ref 4.0–10.5)
nRBC: 0 % (ref 0.0–0.2)

## 2018-11-23 LAB — COMPREHENSIVE METABOLIC PANEL
ALT: 12 U/L (ref 0–44)
AST: 46 U/L — ABNORMAL HIGH (ref 15–41)
Albumin: 1.2 g/dL — ABNORMAL LOW (ref 3.5–5.0)
Alkaline Phosphatase: 40 U/L (ref 38–126)
Anion gap: 32 — ABNORMAL HIGH (ref 5–15)
BUN: 21 mg/dL (ref 8–23)
CO2: 30 mmol/L (ref 22–32)
Calcium: 6.9 mg/dL — ABNORMAL LOW (ref 8.9–10.3)
Chloride: 95 mmol/L — ABNORMAL LOW (ref 98–111)
Creatinine, Ser: 1.74 mg/dL — ABNORMAL HIGH (ref 0.61–1.24)
GFR calc Af Amer: 45 mL/min — ABNORMAL LOW (ref >60)
GFR calc non Af Amer: 39 mL/min — ABNORMAL LOW (ref >60)
Glucose, Bld: 64 mg/dL — ABNORMAL LOW (ref 70–99)
Potassium: 4.9 mmol/L (ref 3.5–5.1)
Sodium: 157 mmol/L — ABNORMAL HIGH (ref 135–145)
Total Bilirubin: 0.9 mg/dL (ref 0.3–1.2)
Total Protein: 3 g/dL — ABNORMAL LOW (ref 6.5–8.1)

## 2018-11-23 LAB — BASIC METABOLIC PANEL
Anion gap: 11 (ref 5–15)
BUN: 18 mg/dL (ref 8–23)
CO2: 29 mmol/L (ref 22–32)
Calcium: 9.1 mg/dL (ref 8.9–10.3)
Chloride: 93 mmol/L — ABNORMAL LOW (ref 98–111)
Creatinine, Ser: 1 mg/dL (ref 0.61–1.24)
GFR calc Af Amer: >60 mL/min (ref >60)
GFR calc non Af Amer: >60 mL/min (ref >60)
Glucose, Bld: 123 mg/dL — ABNORMAL HIGH (ref 70–99)
Potassium: 4.6 mmol/L (ref 3.5–5.1)
Sodium: 133 mmol/L — ABNORMAL LOW (ref 135–145)

## 2018-11-23 LAB — LACTATE DEHYDROGENASE
LDH: 239 U/L — ABNORMAL HIGH (ref 98–192)
LDH: 183 U/L (ref 98–192)

## 2018-11-23 LAB — PHOSPHORUS: Phosphorus: 3 mg/dL (ref 2.5–4.6)

## 2018-11-23 LAB — COOXEMETRY PANEL
Carboxyhemoglobin: 1.3 % (ref 0.5–1.5)
Carboxyhemoglobin: 1.3 % (ref 0.5–1.5)
Methemoglobin: 0.9 % (ref 0.0–1.5)
Methemoglobin: 0.9 % (ref 0.0–1.5)
O2 Saturation: 39.4 %
O2 Saturation: 44.2 %
Total hemoglobin: 16.3 g/dL — ABNORMAL HIGH (ref 12.0–16.0)
Total hemoglobin: 12.3 g/dL (ref 12.0–16.0)

## 2018-11-23 LAB — APTT: aPTT: >200 seconds (ref 24–36)

## 2018-11-23 LAB — MAGNESIUM
Magnesium: 1.7 mg/dL (ref 1.7–2.4)
Magnesium: 2.1 mg/dL (ref 1.7–2.4)

## 2018-11-23 LAB — GLUCOSE, CAPILLARY
Glucose-Capillary: 108 mg/dL — ABNORMAL HIGH (ref 70–99)
Glucose-Capillary: 118 mg/dL — ABNORMAL HIGH (ref 70–99)
Glucose-Capillary: 103 mg/dL — ABNORMAL HIGH (ref 70–99)

## 2018-11-23 LAB — PROTIME-INR
INR: 1.4 — ABNORMAL HIGH (ref 0.8–1.2)
INR: 3.4 — ABNORMAL HIGH (ref 0.8–1.2)
Prothrombin Time: 16.8 seconds — ABNORMAL HIGH (ref 11.4–15.2)
Prothrombin Time: 33.6 seconds — ABNORMAL HIGH (ref 11.4–15.2)

## 2018-11-23 LAB — HEPARIN LEVEL (UNFRACTIONATED)
Heparin Unfractionated: 0.23 IU/mL — ABNORMAL LOW (ref 0.30–0.70)
Heparin Unfractionated: 0.25 IU/mL — ABNORMAL LOW (ref 0.30–0.70)
Heparin Unfractionated: 0.24 IU/mL — ABNORMAL LOW (ref 0.30–0.70)

## 2018-11-23 SURGERY — CORONARY ARTERY BYPASS GRAFTING (CABG)
Anesthesia: General | Site: Chest

## 2018-11-23 MED ORDER — NITROGLYCERIN IN D5W 200-5 MCG/ML-% IV SOLN: 2.0000 ug/min | INTRAVENOUS | Status: DC

## 2018-11-23 MED ORDER — TRANEXAMIC ACID 1000 MG/10ML IV SOLN
1.5000 mg/kg/h | INTRAVENOUS | Status: DC
  Filled 2018-11-23: qty 25

## 2018-11-23 MED ORDER — EPINEPHRINE 1 MG/10ML IJ SOSY
PREFILLED_SYRINGE | INTRAMUSCULAR | Status: AC
  Filled 2018-11-23: qty 10

## 2018-11-23 MED ORDER — TRANEXAMIC ACID (OHS) PUMP PRIME SOLUTION
2.0000 mg/kg | INTRAVENOUS | Status: DC
  Filled 2018-11-23: qty 1.48

## 2018-11-23 MED ORDER — DEXMEDETOMIDINE HCL IN NACL 400 MCG/100ML IV SOLN
0.1000 ug/kg/h | INTRAVENOUS | Status: DC
  Filled 2018-11-23 (×2): qty 100

## 2018-11-23 MED ORDER — PLASMA-LYTE 148 IV SOLN
INTRAVENOUS | Status: DC
  Filled 2018-11-23 (×2): qty 2.5

## 2018-11-23 MED ORDER — SODIUM CHLORIDE 0.9 % IV SOLN
1.5000 g | INTRAVENOUS | Status: DC
  Filled 2018-11-23: qty 1.5

## 2018-11-23 MED ORDER — MAGNESIUM SULFATE 50 % IJ SOLN
40.0000 meq | INTRAMUSCULAR | Status: DC
  Filled 2018-11-23 (×3): qty 9.85

## 2018-11-23 MED ORDER — SODIUM CHLORIDE 0.9 % IV SOLN
INTRAVENOUS | Status: DC
  Filled 2018-11-23: qty 30

## 2018-11-23 MED ORDER — MILRINONE LACTATE IN DEXTROSE 20-5 MG/100ML-% IV SOLN: 0.3000 ug/kg/min | INTRAVENOUS | Status: DC

## 2018-11-23 MED ORDER — METOLAZONE 2.5 MG PO TABS
2.5000 mg | ORAL_TABLET | Freq: Once | ORAL | Status: AC
  Administered 2018-11-23: 09:00:00 2.5 mg via ORAL
  Filled 2018-11-23: qty 1

## 2018-11-23 MED ORDER — MANNITOL 20 % IV SOLN
Freq: Once | INTRAVENOUS | Status: DC
  Filled 2018-11-23: qty 13

## 2018-11-23 MED ORDER — TRANEXAMIC ACID (OHS) BOLUS VIA INFUSION
15.0000 mg/kg | INTRAVENOUS | Status: DC
  Filled 2018-11-23: qty 1110

## 2018-11-23 MED ORDER — MIDAZOLAM HCL (PF) 10 MG/2ML IJ SOLN
INTRAMUSCULAR | Status: AC
  Filled 2018-11-23: qty 2

## 2018-11-23 MED ORDER — WARFARIN SODIUM 5 MG PO TABS
5.0000 mg | ORAL_TABLET | Freq: Once | ORAL | Status: AC
  Administered 2018-11-23: 18:00:00 5 mg via ORAL
  Filled 2018-11-23: qty 1

## 2018-11-23 MED ORDER — FENTANYL CITRATE (PF) 250 MCG/5ML IJ SOLN
INTRAMUSCULAR | Status: AC
  Filled 2018-11-23: qty 5

## 2018-11-23 MED ORDER — AMIODARONE HCL 100 MG PO TABS
ORAL_TABLET | ORAL | Status: AC
  Filled 2018-11-23: qty 1

## 2018-11-23 MED ORDER — VANCOMYCIN HCL 10 G IV SOLR
1250.0000 mg | INTRAVENOUS | Status: DC
  Filled 2018-11-23: qty 1250

## 2018-11-23 MED ORDER — PHENYLEPHRINE HCL-NACL 20-0.9 MG/250ML-% IV SOLN
30.0000 ug/min | INTRAVENOUS | Status: DC
  Filled 2018-11-23: qty 250

## 2018-11-23 MED ORDER — SODIUM CHLORIDE 0.9% IV SOLUTION: Freq: Once | INTRAVENOUS | Status: DC

## 2018-11-23 MED ORDER — EPINEPHRINE HCL 5 MG/250ML IV SOLN IN NS
0.0000 ug/min | INTRAVENOUS | Status: DC
  Filled 2018-11-23: qty 250

## 2018-11-23 MED ORDER — SODIUM BICARBONATE-DEXTROSE 150-5 MEQ/L-% IV SOLN
150.0000 meq | Freq: Once | INTRAVENOUS | Status: DC
  Filled 2018-11-23: qty 1000

## 2018-11-23 MED ORDER — SODIUM CHLORIDE 0.9 % IV SOLN
750.0000 mg | INTRAVENOUS | Status: DC
  Filled 2018-11-23: qty 750

## 2018-11-23 MED ORDER — DOPAMINE-DEXTROSE 3.2-5 MG/ML-% IV SOLN
0.0000 ug/kg/min | INTRAVENOUS | Status: DC
  Filled 2018-11-23 (×2): qty 250

## 2018-11-23 MED ORDER — INSULIN REGULAR(HUMAN) IN NACL 100-0.9 UT/100ML-% IV SOLN: INTRAVENOUS | Status: DC

## 2018-11-23 MED ORDER — SODIUM BICARBONATE 8.4 % IV SOLN
INTRAVENOUS | Status: DC
  Filled 2018-11-23: qty 150

## 2018-11-23 MED ORDER — EPINEPHRINE HCL 5 MG/250ML IV SOLN IN NS
0.5000 ug/min | INTRAVENOUS | Status: DC
  Filled 2018-11-23: qty 250

## 2018-11-23 MED ORDER — POTASSIUM CHLORIDE 2 MEQ/ML IV SOLN
80.0000 meq | INTRAVENOUS | Status: DC
  Filled 2018-11-23 (×3): qty 40

## 2018-11-23 DEATH — deceased

## 2018-11-24 ENCOUNTER — Telehealth (HOSPITAL_COMMUNITY): Payer: Self-pay | Admitting: Licensed Clinical Social Worker

## 2018-11-24 LAB — CBC
HCT: 9.2 % — ABNORMAL LOW (ref 39.0–52.0)
Hemoglobin: 2.8 g/dL — CL (ref 13.0–17.0)
MCH: 29.5 pg (ref 26.0–34.0)
MCHC: 30.4 g/dL (ref 30.0–36.0)
MCV: 96.8 fL (ref 80.0–100.0)
Platelets: 123 10*3/uL — ABNORMAL LOW (ref 150–400)
RBC: <1 MIL/uL — ABNORMAL LOW (ref 4.22–5.81)
RDW: 17 % — ABNORMAL HIGH (ref 11.5–15.5)
WBC: 10.1 10*3/uL (ref 4.0–10.5)
nRBC: 0.2 % (ref 0.0–0.2)

## 2018-11-24 LAB — TYPE AND SCREEN
ABO/RH(D): O POS
Antibody Screen: NEGATIVE
Unit division: 0
Unit division: 0
Unit division: 0
Unit division: 0

## 2018-11-24 LAB — BPAM RBC
Blood Product Expiration Date: 202012052359
Blood Product Expiration Date: 202012052359
Blood Product Expiration Date: 202012052359
Blood Product Expiration Date: 202012052359
ISSUE DATE / TIME: 202011012047
ISSUE DATE / TIME: 202011012047
ISSUE DATE / TIME: 202011012047
ISSUE DATE / TIME: 202011012048
Unit Type and Rh: 5100
Unit Type and Rh: 5100
Unit Type and Rh: 5100
Unit Type and Rh: 5100

## 2018-11-24 MED FILL — Medication: Qty: 1 | Status: AC

## 2018-11-24 NOTE — Telephone Encounter (Signed)
CSW received call from patient's son in law. He stated that the family is grateful for the support of the team and stated "Sometimes God has a plan". He was very tearful and appreciative of the team. CSW provided support and offered grief resources if needed in the future. CSW available as needed. Raquel Sarna, [place], [place]

## 2018-11-24 NOTE — Telephone Encounter (Signed)
CSW contacted patient's wife to offer support. Patient passed away last night and wife and daughter were present. Wife states they are coping with the unexpected news. Wife shared how great patient was doing and how hopeful they were with his recovery. CSW provided supportive intervention and offered grief support resources if needed in the future. Wife stated she was grateful to the team for all the support. CSW available if needed in the future. Raquel Sarna, [place], [place]

## 2018-12-23 NOTE — Progress Notes (Signed)
Patient ID: Keith Nguyen., male   DOB: 03-08-1950, 68 y.o.   MRN: [id number]   Advanced Heart Failure VAD Team Note  PCP-Cardiologist: Dr. Aundra Dubin    Subjective:    S/P  HMIII LVAD Aortic valve closure. Chest tubes remain in place. Minimal pocket pain. LDH 239.  MAP 70s-80s.  CVP 10 today.  Weight down 3 lbs with IV Lasix, still well above pre-op weight.   Remains in atrial flutter w/rate in the 70s. On IV amio 30 mg/hr.  In bed eating breakfast, did not get a walk yesterday.    LVAD INTERROGATION:  HeartMate III LVAD:   Flow 4.7 liters/min, speed 5400, power 3.9, PI 3.6. Multiple PI events.     Objective:    Vital Signs:   Temp:  [97.8 F (36.6 C)-99.1 F (37.3 C)] 99 F (37.2 C) (11/01 0734) Pulse Rate:  [37-88] 88 (11/01 0734) Resp:  [20-35] 20 (11/01 0734) BP: (86-104)/(56-85) 97/72 (11/01 0734) SpO2:  [86 %-98 %] 96 % (11/01 0734) Weight:  [74 kg] 74 kg (11/01 0500) Last BM Date: 11/15/18 Mean arterial Pressure 80s   Intake/Output:   Intake/Output Summary (Last 24 hours) at Nov 25, 2018 0905 Last data filed at 11-25-2018 0700 Gross per 24 hour  Intake 1249.08 ml  Output 2650 ml  Net -1400.92 ml     Physical Exam   General: Well appearing this am. NAD.  HEENT: Normal. Neck: Supple, JVP 10-12 cm. Carotids OK.  Cardiac:  Mechanical heart sounds with LVAD hum present.  Lungs:  CTAB, normal effort.  Abdomen:  NT, ND, no HSM. No bruits or masses. +BS  LVAD exit site: Well-healed and incorporated. Dressing dry and intact. No erythema or drainage. Stabilization device present and accurately applied. Driveline dressing changed daily per sterile technique. Extremities:  Warm and dry. No cyanosis, clubbing, rash, or edema.  Neuro:  Alert & oriented x 3. Cranial nerves grossly intact. Moves all 4 extremities w/o difficulty. Affect pleasant     Telemetry   Atrial flutter w/rate 70s  EKG   n/a  Labs   Basic Metabolic Panel: Recent Labs  Lab 11/19/18 0305   11/20/18 0409 11/20/18 0434 11/21/18 0354 11/22/18 0421 11/25/18 0247  NA 132*   < > 136 132* 132* 132* 133*  K 4.3   < > 3.6 4.0 3.7 4.1 4.6  CL 103  --   --  98 96* 95* 93*  CO2 22  --   --  '25 26 28 29  ' GLUCOSE 99  --   --  102* 159* 103* 123*  BUN 13  --   --  '14 17 20 18  ' CREATININE 1.04  --   --  1.04 0.86 0.97 1.00  CALCIUM 8.9  --   --  9.0 8.9 9.0 9.1  MG 1.8  --   --  1.9 1.8 2.0 1.7  PHOS 3.3  --   --  3.0 3.3 2.8 3.0   < > = values in this interval not displayed.    Liver Function Tests: Recent Labs  Lab 11/16/18 2146 11/18/18 0256 11/19/18 0305 11/20/18 0434  AST 54* 120* 96* 54*  ALT '31 18 19 12  ' ALKPHOS 66 33* 46 60  BILITOT 0.7 2.6* 3.7* 1.8*  PROT 7.2 5.1* 5.8* 6.2*  ALBUMIN 2.8* 3.2* 3.1* 3.1*   No results for input(s): LIPASE, AMYLASE in the last 168 hours. No results for input(s): AMMONIA in the last 168 hours.  CBC: Recent Labs  Lab  11/19/18 0305  11/20/18 0409 11/20/18 0434 11/21/18 0354 11/22/18 0421 Dec 05, 2018 0247  WBC 16.5*  --   --  15.1* 9.3 8.4 10.3  NEUTROABS 14.2*  --   --  12.7* 7.7 6.5 8.5*  HGB 8.4*   < > 8.2* 8.4* 7.9* 8.1* 8.3*  HCT 25.1*   < > 24.0* 24.7* 24.1* 24.5* 24.2*  MCV 91.6  --   --  91.5 92.3 93.2 89.0  PLT 91*  --   --  104* 121* 162 207   < > = values in this interval not displayed.    INR: Recent Labs  Lab 11/19/18 1012 11/20/18 0434 11/21/18 0354 11/22/18 0421 12/05/18 0247  INR 1.5* 1.2 1.2 1.3* 1.4*    Other results:  EKG:    Imaging   Dg Chest Port 1 View  Result Date: 11/22/2018 CLINICAL DATA:  Status post left ventricular assist device placement on 11/20/2018. EXAM: PORTABLE CHEST 1 VIEW COMPARISON:  11/20/2018 FINDINGS: The left ventricular assist device is unchanged. Interval right PICC with its tip in the region of the superior cavoatrial junction. A small diameter catheter remains projected over the left ventricle. Stable enlarged cardiac silhouette and tortuous and calcified thoracic  aorta. Median sternotomy wires are again demonstrated. The right jugular Swan-Ganz catheter sheath has been removed. No significant change in mild linear atelectasis at the left lung base. The right lung remains clear. No acute bony abnormality. IMPRESSION: 1. Stable cardiomegaly and mild left basilar atelectasis. 2. Stable left ventricular assist device with a small diameter catheter overlying the left ventricle. 3. No acute abnormality. Electronically Signed   By: Claudie Revering M.D.   On: 11/22/2018 08:00     Medications:     Scheduled Medications:  sodium chloride   Intravenous Once   allopurinol  100 mg Oral Daily   aspirin EC  325 mg Oral Daily   Or   aspirin  324 mg Per Tube Daily   Or   aspirin  300 mg Rectal Daily   bisacodyl  10 mg Oral Daily   Or   bisacodyl  10 mg Rectal Daily   chlorhexidine gluconate (MEDLINE KIT)  15 mL Mouth Rinse BID   Chlorhexidine Gluconate Cloth  6 each Topical Daily   colchicine  0.6 mg Oral Daily   digoxin  0.125 mg Oral Daily   docusate sodium  200 mg Oral Daily   feeding supplement (ENSURE ENLIVE)  237 mL Oral TID BM   ferrous [id number] C-folic acid  1 capsule Oral BID PC   furosemide  60 mg Intravenous BID   insulin aspart  0-24 Units Subcutaneous Q4H   loratadine  10 mg Oral Daily   mouth rinse  15 mL Mouth Rinse BID   metolazone  2.5 mg Oral Once   pantoprazole  40 mg Oral Daily   polyethylene glycol  17 g Oral Daily   sodium chloride flush  10-40 mL Intracatheter Q12H   sodium chloride flush  10-40 mL Intracatheter Q12H   sodium chloride flush  3 mL Intravenous Q12H   spironolactone  12.5 mg Oral Daily   warfarin  5 mg Oral ONCE-1800   Warfarin - Pharmacist Dosing Inpatient   Does not apply q1800    Infusions:  sodium chloride Stopped (11/18/18 1944)   sodium chloride     sodium chloride     sodium chloride     sodium chloride     sodium chloride     amiodarone 30 mg/hr (  11-30-18  0400)   heparin 1,250 Units/hr (2018/11/30 0359)   lactated ringers Stopped (11/10/2018 1610)    PRN Medications: sodium chloride, sodium chloride, sodium chloride, acetaminophen, albuterol, bisacodyl, bisacodyl, hydrALAZINE, methocarbamol, morphine injection, ondansetron (ZOFRAN) IV, ondansetron **OR** [DISCONTINUED] ondansetron (ZOFRAN) IV, oxyCODONE, sodium chloride, sodium chloride flush, sodium chloride flush, sodium chloride flush, traMADol     Assessment/Plan:     1. S/P HMIII DT: Extubated. Off epinephrine and milrinone, remains on low dose NE with stable MAP.  Hgb down with expected blood loss. Received 1UPRBC 10/27. Chest tubes remains in place. LDH trending down 320>257>229>239. INR 1.4.  - Continue ASA 325 until INR 2 then will drop to 81 mg daily.   - Continue heparin gtt + warfarin until INR 2.   - Will need ramp echo prior to discharge, can do in the next couple days.  2. Acute on chronic systolic CHF: Nonischemic cardiomyopathy diagnosed in 2017 with echo showing EF 40% and LHC showing mild nonobstructive CAD. He saw Dr. Wynonia Lawman in the past, but no cardiology evaluation since 2017. 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 He has drunk moderate ETOH in the past (no longer drinks) but does not seem to have drunk enough to cause a cardiomyopathy. He was markedly volume overloaded on exam initially with biventricular failure and NYHA class IV at admission with rise in creatinine concerning for cardiorenal syndrome. Initial co-ox 40% suggested low CO, milrinone started and increased to 0.375.    RHC showed R>L heart failure.  Cardiac output looked good on milrinone by Fick but was low by thermodilution (discordant values).  Repeat LHC/RHC showed very low filling pressures and good cardiac index by Fick.  No significant coronary  disease noted.  S/P HMIII LVAD as above.  CVP 10, weight down 3 lbs but still well above pre-op weight.  Creatinine stable with diuresis.  Co-ox low this morning but drawn early am, think not accurate (arterial sats may have been low while sleeping). - Redraw co-ox.   - Continue digoxin 0.125 mg daily.  - Continue Lasix 60 mg IV and will give 1 dose of metolazone again today.   - Continue spironolactone 12.5 mg daily 2. AKI on CKD stage 3: Suspect this may be a combination of cardiorenal syndrome and contrast nephropathy (had CTA chest at admission). Minimal contrast with coronary angiography.  Creatinine stable today at 1.0. Monitor w/ diuresis  3. 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 He was drinking moderate ETOH as well but has quit completely for about 1 month. Childs A per GI.  4. COPD/smoking: He recently quit smoking.CT chest with moderate emphysema. He is now off oxygen. Minimal obstruction on PFTs. 5. Atrial flutter/fibrillation: Appears to be in atrial flutter with rate in the 70s today.   - Continue IV amiodarone load.  - Keep K >4.0 and Mg >2.0.  - TEE-guided DCCV prior to discharge if he stays in flutter.  6. Hyponatremia: Stable.  7. Aortic insufficiency: ?Moderate range. S/p AV closure with LVAD 11/16/2018   Length of Stay: 19  Loralie Champagne, MD 2018-11-30, 9:05 AM  VAD Team --- VAD ISSUES ONLY--- Pager [phone] (7am - 7am)  Advanced Heart Failure Team  Pager [phone] (M-F; 7a - 4p)  Please contact [facility] for night-coverage after hours (4p -7a ) and weekends on  [email or web address]

## 2018-12-23 NOTE — Code Documentation (Signed)
  Patient Name: Keith Nguyen.   MRN: [id number]   Date of Birth/ Sex: August 14, 1950 , male      Admission Date: 11/22/2018  Attending Provider: No att. providers found  Primary Diagnosis: Thoracic aortic aneurysm without rupture ([place]) [I71.2] Compression fracture of body of thoracic vertebra ([place]) [S22.000A] Acute on chronic congestive heart failure, unspecified heart failure type ([place]) [I50.9] Acute CHF (congestive heart failure) ([place]) [I50.9]   Indication: Pt was in his usual state of health until this PM, when he was noted to have PEA arrest. Code blue was subsequently called. At the time of arrival on scene, ACLS protocol was underway.   Technical Description:  - CPR performance duration:  None  - Was defibrillation or cardioversion used? No  - Was external pacer placed? yes  - Was patient intubated pre/post CPR? yes   Medications Administered: Y = Yes; Blank = No Amiodarone    Atropine    Calcium    Epinephrine  y  Lidocaine    Magnesium    Norepinephrine  y  Phenylephrine    Sodium bicarbonate  y  Vasopressin    Other    Post CPR evaluation:  - Final Status - Was patient successfully resuscitated ? No   Miscellaneous Information:  - Time of death:  9:06 PM  - Primary team notified?  yes  - Family Notified? yes     Earlene Plater, MD Internal Medicine, PGY1 Pager: [phone]  Dec 12, 2018,11:26 PM

## 2018-12-23 NOTE — Progress Notes (Signed)
ANTICOAGULATION CONSULT NOTE - Follow Up Consult  Pharmacy Consult for warfarin + heparin Indication: LVAD  Allergies  Allergen Reactions  . Lipitor [Atorvastatin]     Elevated liver enzymes    Patient Measurements: Height: 5\' 11"  (180.3 cm) Weight: 163 lb 2.3 oz (74 kg) IBW/kg (Calculated) : 75.3  Heparin dosing weight: 75 kg  Vital Signs: Temp: 99.1 F (37.3 C) (10/31 2300) Temp Source: Oral (10/31 2300) BP: 86/56 (10/31 2300) Pulse Rate: 60 (10/31 2300)  Labs: Recent Labs    11/21/18 0354  11/22/18 0421 11/22/18 1715 30-Nov-2018 0247 2018/11/30 0454  HGB 7.9*  --  8.1*  --  8.3*  --   HCT 24.1*  --  24.5*  --  24.2*  --   PLT 121*  --  162  --  207  --   LABPROT 14.7  --  15.7*  --  16.8*  --   INR 1.2  --  1.3*  --  1.4*  --   HEPARINUNFRC  --    < > 0.14* 0.11* 0.23* 0.25*  CREATININE 0.86  --  0.97  --  1.00  --    < > = values in this interval not displayed.    Estimated Creatinine Clearance: 73 mL/min (by C-G formula based on SCr of 1 mg/dL).   Medical History: Past Medical History:  Diagnosis Date  . Abnormal cardiac function test 09/16/2015  . Aortic atherosclerosis ([place]) 09/16/2015  . History of hepatitis C 09/16/2015   Prior treatment with Harvoni   . Hyperlipidemia 09/16/2015  . Hypertensive heart disease without CHF 09/16/2015   Assessment: 68 year old male s/p HM-3 implant with aortic valve closure 10/26. Warfarin started on 10/27. Patient with afib w/ RVR and amiodarone PO switched to gtt. Drinking Ensure supplements as well averaging about 3 per day. Will need to watch INR closely.   INR today is 1.4, Hgb stable 8.3, pltc wnl, LDH stable 200s.  Heparin level this morning is below goal 0.24. Chest tube output staying consistent per records. No output charted last evening. Will target lower heparin goal for now.  Goal of Therapy:  INR goal 2-2.5  Heparin level 0.3-0.5 Monitor platelets by anticoagulation protocol: Yes   Plan:  Increase heparin  to 1350 units/hr Recheck heparin level in 6 hours Warfarin 5 mg x 1 today Daily INR, heparin level, CBC Decrease aspirin to 81mg  when INR closer to 2  Vertis Kelch, PharmD PGY2 Cardiology Pharmacy Resident Phone [phone] Nov 30, 2018       7:33 AM  Please check [email or web address] for unit-specific pharmacist phone numbers

## 2018-12-23 NOTE — Progress Notes (Signed)
ANTICOAGULATION CONSULT NOTE - Follow Up Consult  Pharmacy Consult for heparin Indication: LVAD  Labs: Recent Labs    11/20/18 0434 11/21/18 0354  11/22/18 0421 11/22/18 1715 Nov 30, 2018 0247  HGB 8.4* 7.9*  --  8.1*  --  8.3*  HCT 24.7* 24.1*  --  24.5*  --  24.2*  PLT 104* 121*  --  162  --  207  LABPROT 15.3* 14.7  --  15.7*  --   --   INR 1.2 1.2  --  1.3*  --   --   HEPARINUNFRC  --   --    < > 0.14* 0.11* 0.23*  CREATININE 1.04 0.86  --  0.97  --   --    < > = values in this interval not displayed.    Assessment: 68yo male subtherapeutic on heparin after rate changes; no gtt issues or signs of bleeding per RN.  Goal of Therapy:  Heparin level 0.3-0.5 units/ml   Plan:  Will increase heparin gtt by 2 units/kg/hr to 1250 units/hr and check level in 6 hours.    Wynona Neat, PharmD, BCPS  November 30, 2018,3:32 AM

## 2018-12-23 NOTE — Progress Notes (Addendum)
Pt ambulated in a room and little steps in a hallway almost 30 ft, pt could not tolerate walking more than that. He became distress and sweated . Pt is seating in a recliner comfortably now, vitals stable , pulse ox 92-96 on 2 l oxygen via [place] Chest tube site CDI LVAD site CDI IV heparin and IV Amiodarone contd. Extra peripheral line provided besides PICC as Lab needed to be drawn and pt is getting heparin via PICC Denies chest pain, will continue to monitor the patient  Palma Holter, RN

## 2018-12-23 NOTE — Progress Notes (Signed)
ABG collected during active Internal Cardiac Massage per MD.

## 2018-12-23 NOTE — Progress Notes (Signed)
Chaplain responded to Code Blue, provided emotional and spiritual support to family in crisis, including refreshment and prayer.  Chaplain appreciated the attention of RN Mliss Sax, as well as the RN coming on shift, and to [employer] in caring for the wife before and after the chaplain's arrival.   Support is transferred to [employer] for ongoing care.    Luana Shu [id number]    2018-11-26 2000  Clinical Encounter Type  Visited With Family  Visit Type Code  Referral From Physician  Consult/Referral To Chaplain  Spiritual Encounters  Spiritual Needs Prayer  Stress Factors  Patient Stress Factors Health changes  Family Stress Factors Lack of knowledge

## 2018-12-23 NOTE — Progress Notes (Addendum)
1853: Received page from bedside nurse stating that the pts flow is 2.6 and PI is 8.2. informed that doppler is 80 and the the BP is 99/66. VAD coordinator asked about bleeding from CT-nurse denies any significnat output from CT and denies any bloody stools. Nurse states that the pt is in [place] 60s. Nurse informed to give 500NS per Dr. Aundra Dubin.  I called nurse at [phone] to check on pt and and she states that flow is still 2.7. VAD coordinator asked about bleeding again, nurse denies any signficant output from CT or bloody stools and states that the pt is very sleepy. She states he has not had any pain meds.  1942: received page from bedside nurse stating the pt has put out 750cc of blood from his CT on her shift. Nurse instructed to give fluids as quickly as possible and send a STAT CBC.  1948: received page from bedside nurse stating that the pt is coding. Dr. Aundra Dubin notified and Dr. Orvan Seen notified of possible tamponade.   On my arrival to hospital pt was in ICU with chest open. I dropped his VAD speed to 5000.  Once code was called, pump was turned off at the request of Dr. Aundra Dubin and Dr. Kipp Brood.  Tanda Rockers RN, BSN VAD Coordinator 24/7 Pager [phone]

## 2018-12-23 NOTE — Progress Notes (Signed)
RRT at bedside during open chest cardiac massage per MD. Patient on the ventilator throughout procedure on 100%. RRT goal for this patient is to establish and maintain good ventilation and oxygenation via utilization of mechanical ventilation using appropriate settings to meet systemic and myocardial oxygen demands to assure adequate tissue perfusion. During internal cardiac massage MD stated that there was no RV function. All resuscitation efforts were exhausted and was unable to establish and restore native cardiac function. MD Prescott Gum, MD Aundra Dubin, and MD Orvan Seen all at bedside. Patient is now expired per MD Prescott Gum.  Sumiko Ceasar L. Jennette Kettle, RRT, RCP

## 2018-12-23 NOTE — Plan of Care (Signed)
  Problem: Education: Goal: Knowledge of General Education information will improve Description: Including pain rating scale, medication(s)/side effects and non-pharmacologic comfort measures Outcome: Progressing   Problem: Health Behavior/Discharge Planning: Goal: Ability to manage health-related needs will improve Outcome: Progressing   Problem: Clinical Measurements: Goal: Ability to maintain clinical measurements within normal limits will improve Outcome: Progressing Goal: Will remain free from infection Outcome: Progressing Goal: Diagnostic test results will improve Outcome: Progressing Goal: Respiratory complications will improve Outcome: Progressing Goal: Cardiovascular complication will be avoided Outcome: Progressing   Problem: Activity: Goal: Risk for activity intolerance will decrease Outcome: Progressing   Problem: Nutrition: Goal: Adequate nutrition will be maintained Outcome: Progressing   Problem: Coping: Goal: Level of anxiety will decrease Outcome: Progressing   Problem: Elimination: Goal: Will not experience complications related to bowel motility Outcome: Progressing Goal: Will not experience complications related to urinary retention Outcome: Progressing   Problem: Pain Managment: Goal: General experience of comfort will improve Outcome: Progressing   Problem: Safety: Goal: Ability to remain free from injury will improve Outcome: Progressing   Problem: Skin Integrity: Goal: Risk for impaired skin integrity will decrease Outcome: Progressing   Problem: Education: Goal: Knowledge of the prescribed therapeutic regimen will improve Outcome: Progressing   Problem: Activity: Goal: Risk for activity intolerance will decrease Outcome: Progressing   Problem: Cardiac: Goal: Ability to maintain an adequate cardiac output will improve Outcome: Progressing   Problem: Coping: Goal: Level of anxiety will decrease Outcome: Progressing   Problem:  Fluid Volume: Goal: Risk for excess fluid volume will decrease Outcome: Progressing   Problem: Clinical Measurements: Goal: Ability to maintain clinical measurements within normal limits will improve Outcome: Progressing Goal: Will remain free from infection Outcome: Progressing   Problem: Respiratory: Goal: Will regain and/or maintain adequate ventilation Outcome: Progressing   Problem: Education: Goal: Will demonstrate proper wound care and an understanding of methods to prevent future damage Outcome: Progressing Goal: Knowledge of disease or condition will improve Outcome: Progressing Goal: Knowledge of the prescribed therapeutic regimen will improve Outcome: Progressing Goal: Individualized Educational Video(s) Outcome: Progressing   Problem: Activity: Goal: Risk for activity intolerance will decrease Outcome: Progressing   Problem: Respiratory: Goal: Respiratory status will improve Outcome: Progressing   Problem: Skin Integrity: Goal: Wound healing without signs and symptoms of infection Outcome: Progressing Goal: Risk for impaired skin integrity will decrease Outcome: Progressing   Problem: Urinary Elimination: Goal: Ability to achieve and maintain adequate renal perfusion and functioning will improve Outcome: Progressing

## 2018-12-23 NOTE — Progress Notes (Addendum)
Patient ID: Keith Donia., male   DOB: [birth year], 68 y.o.   MRN: [id number]  I was notified of patient coding by LVAD coordinator Tanda Rockers about 7:45.  I arrived to Code Blue in progress with PEA arrest around 7:55 pm. Flow on LVAD was 0.4 L/min at my arrival.  TTE was difficult, showed clot/effusion posteriorly primarily adjacent to the LV.  We gave patient multiple rounds of epinephrine and HCO3, pacing him through his epicardial wires.  He was started on epinephrine, HCO3, and norepinephrine gtts and taken to 2H to open his chest.  Dr. Kipp Brood opened his chest and evacuated clot posterior to his heart.  His RV was nonfunctional, requiring open chest massage to generate flow.  With no return of function, code was ended and the LVAD was turned off with patient expiring at 9:06 pm.   Loralie Champagne Dec 21, 2018 9:25 PM

## 2018-12-23 NOTE — Discharge Summary (Signed)
Physician Discharge Summary  Patient ID: Keith Nguyen. MRN: [id number] DOB/AGE: 07-09-1950 68 y.o.  Admit date: 10/23/2018 Discharge date: 12/04/2018  Admission Diagnoses:CHF  Discharge Diagnoses:  Principal Problem:   Acute on chronic congestive heart failure (HCC) Active Problems:   Hyperlipidemia   History of hepatitis C   Essential hypertension   Acute on chronic systolic (congestive) heart failure (HCC)   Protein-calorie malnutrition, severe   DNR no code (do not resuscitate)   Other fatigue   Palliative care by specialist   LVAD (left ventricular assist device) present [facility])   Discharged Condition: deceased  Hospital Course: The patient was admitted to the cardiology service on 11/22/2018 for evaluation and management of acute on chronic heart failure. He responded favorably to milrinone drip and diuresis. His extensive work-up included heart catheterizations and multiple procedures needed to assess for durable LVAD. Multidisciplinary team conferencing agreed that LVAD was his best option for heart failure recovery and independent living. He underwent HeartMate LVAD insertion on 11/12/2018 and did well. On postoperative day 6 he was transferred out of the ICU with tentative plans for discharge in the following few days. On POD #7, he experiences declining function and didn't feel well in general. On the evening of 12/11/2018 a code was called and he was transferred to the ICU for resuscitation. He was reintubated and his chest was opened for open cardiac massage. After at least 30 minutes of open cardiac massage, there was no return of heart function and the code was called. The patient was pronounced deceased. Family was at the bedside.   Consults: CT surgery  Significant Diagnostic Studies: left and right heart catheterization  Treatments: surgery: Heart Mate 3 Lvad  Discharge Exam: Blood pressure (!) 119/107, pulse 68, temperature 98 F (36.7 C), temperature source Oral,  resp. rate 20, height 5\' 11"  (1.803 m), weight 74 kg, SpO2 94 %. General appearance: intubated, sedated Head: Normocephalic, without obvious abnormality, atraumatic, blood in nares Eyes: conjunctivae/corneas clear. PERRL, EOM's intact. Fundi benign. Resp: diminished breath sounds bilaterally Cardio: friction rub heard asystolic GI: abnormal findings:  distended Extremities: cool Pulses: 2+ and symmetric nonpulsatile as per LVAD physiology  Disposition: Post decedent care  Discharge Instructions    AMB Referral to Cardiac Rehabilitation - Phase II   Complete by: As directed    Diagnosis: Heart Failure (see criteria below if ordering Phase II)   Heart Failure Type: Chronic Systolic   After initial evaluation and assessments completed: Virtual Based Care may be provided alone or in conjunction with Phase 2 Cardiac Rehab based on patient barriers.: Yes     Allergies as of 2018/12/11      Reactions   Lipitor [atorvastatin]    Elevated liver enzymes      Medication List    ASK your doctor about these medications   albuterol 108 (90 Base) MCG/ACT inhaler Commonly known as: VENTOLIN HFA Inhale 1-2 puffs into the lungs every 6 (six) hours as needed for wheezing or shortness of breath.   allopurinol 100 MG tablet Commonly known as: ZYLOPRIM Take 100 mg by mouth daily.   aspirin 81 MG tablet Take 81 mg by mouth daily.   furosemide 40 MG tablet Commonly known as: LASIX Take 40 mg by mouth daily.   hydrALAZINE 25 MG tablet Commonly known as: APRESOLINE Take 25 mg by mouth 2 (two) times daily.   losartan 100 MG tablet Commonly known as: COZAAR Take 100 mg by mouth daily.   metoprolol succinate 100 MG 24  hr tablet Commonly known as: TOPROL-XL Take 200 mg by mouth daily. Take with or immediately following a meal.        Signed: Wonda Olds 12/04/2018, 12:27 PM

## 2018-12-23 NOTE — Progress Notes (Addendum)
VAD dressing changed using sterile technique, Chest tube dressing changed, chest tube patent and milking, no clots found, pace wire adjusted properly, pt's wife is in  Bed side and updated, pt is drowsy but easily arousal, he said he exhausted after ambulating, will continue to monitor the patient  Maitri Schnoebelen,RN

## 2018-12-23 NOTE — Progress Notes (Addendum)
At 1900, pt's PI was 8, pump flow 2.7, VAD coordinator Tanda Rockers called, 500 cc bolus given per verbal order,  pt encouraged to drink fluids, pt  still drowsy but managed to drink some ensure and gingerale, pt was alert times 2.  1920 After bolus of 500 cc, pump flow 2.7, PI 7.9, Sarah herbert called again and 500 cc more bolus given At 1935 all of sudden pt started seizing and at [place] code team arrived to the room, refer the code sheets for details and pt transferred to ICU around 2015. Wife was in bed side and aware of the pt's condition.  Palma Holter, RN

## 2018-12-23 NOTE — Procedures (Signed)
Intubation Procedure Note Keith Nguyen [id number] September 03, 1950  Procedure: Intubation Indications: Airway protection and maintenance  Procedure Details Consent: Risks of procedure as well as the alternatives and risks of each were explained to the (patient/caregiver).  Consent for procedure obtained. and Unable to obtain consent because of altered level of consciousness. Time Out: Verified patient identification, verified procedure, site/side was marked, verified correct patient position, special equipment/implants available, medications/allergies/relevent history reviewed, required imaging and test results available.  Performed  Maximum sterile technique was used including gloves and hand hygiene.  MAC and 4    Evaluation Hemodynamic Status: BP stable throughout; O2 sats: currently acceptable Patient's Current Condition: stable Complications: No apparent complications Patient did tolerate procedure well. Chest X-ray ordered to verify placement.  CXR: tube position acceptable.   Keith Nguyen 12/23/18

## 2018-12-23 NOTE — Progress Notes (Signed)
Patient arrived on unit with code team and patient still being coded. Patient was being paced via zoll and transferred to pacer box and paced through epicardial wires per MD order. Doppler pressure  was checked twice reading 24 and 40.  Multiple rounds of bicarb and epinephrine were given along side bicarb, epinephrine and norepinephrine gtts running per MD orders. Dr. Kipp Brood arrived on unit and opened patients chest and evacuated clot. MD stated there was no RV function during internal cardiac massage. About 1.5L blood suctioned out during emergent procedure. With no return of heart function, code was ended and LVAD turned off by VAD coordinator per MD order.

## 2018-12-23 DEATH — deceased

## 2020-05-12 IMAGING — DX DG CHEST 1V PORT
1 series · 1 of 1 positions shown · non-contrast
Comparison: Two days ago

CLINICAL DATA: Chest tube present

EXAM:
PORTABLE CHEST 1 VIEW

[chest ap]
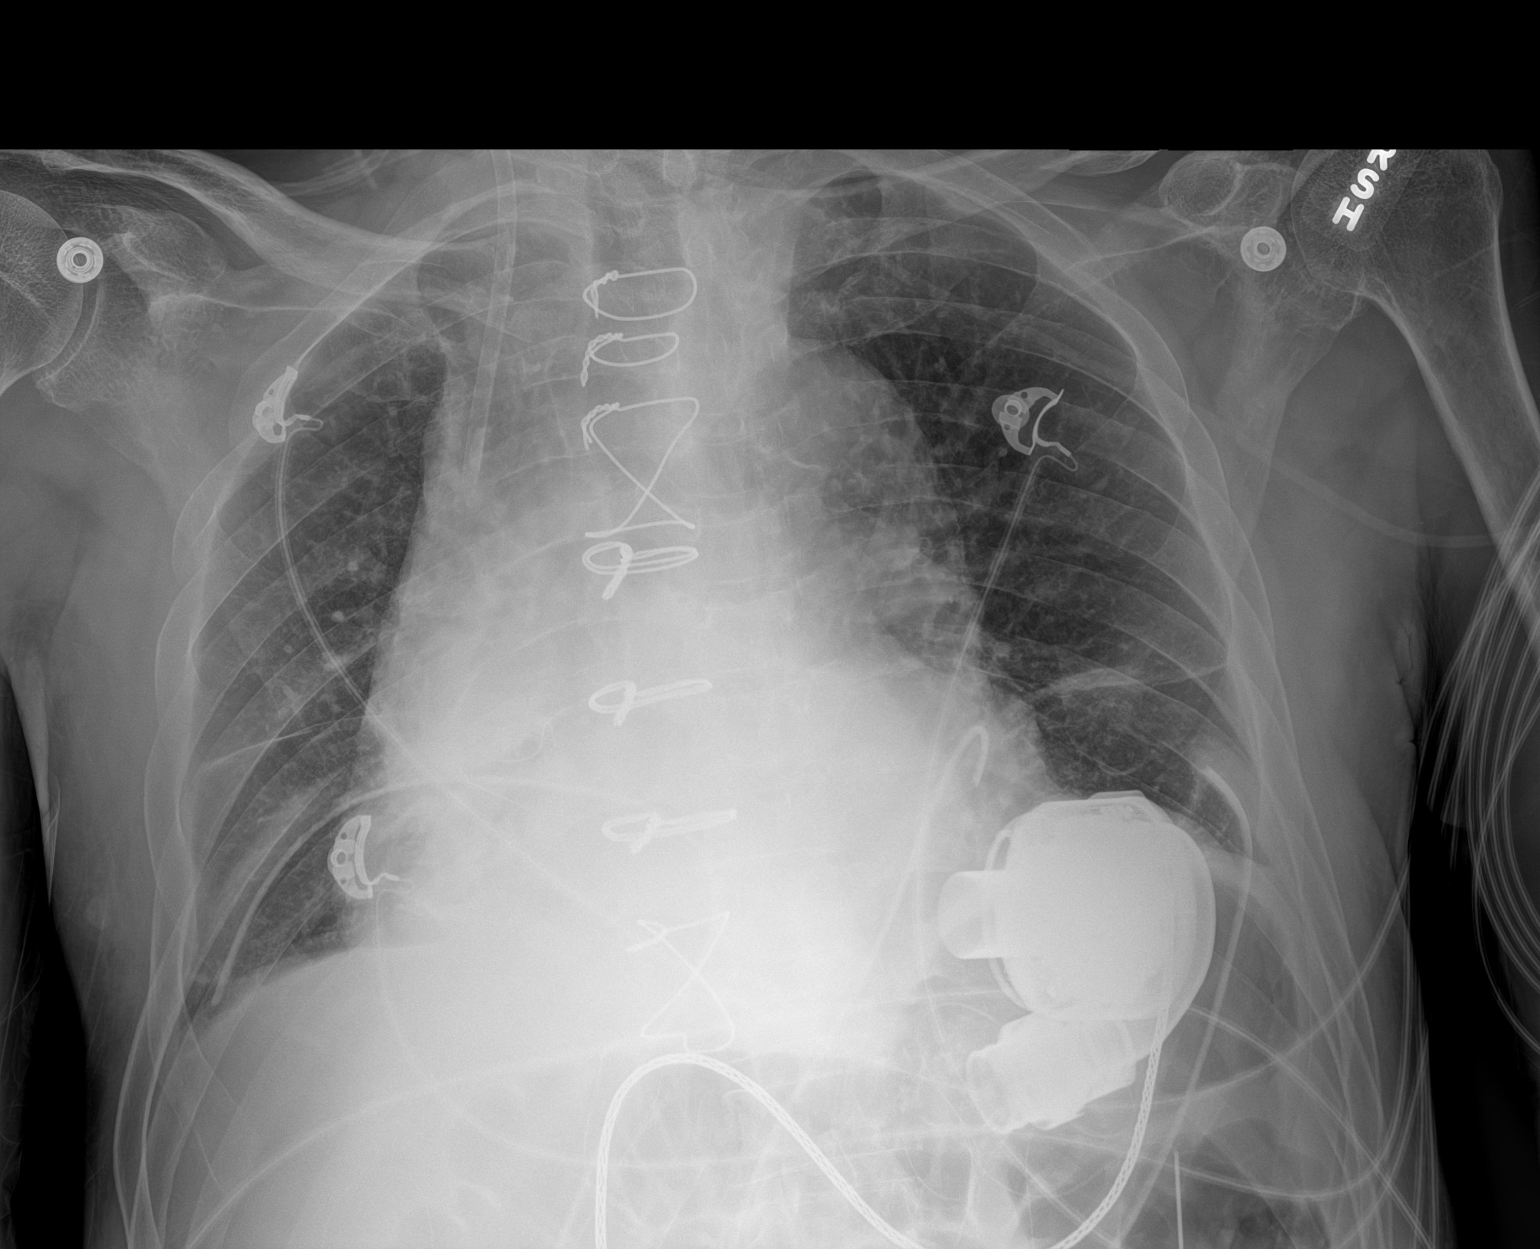

[1 of 1 positions shown; findings below may reference images not displayed]

FINDINGS: LVAD in place. Swan-Ganz catheter has been removed with sheath in
stable position. Chest tubes remain. No visible pneumothorax. Mild
atelectasis. Stable cardiomegaly and vascular pedicle widening
IMPRESSION: Remaining hardware is in stable position. No visible pneumothorax.
Stable atelectasis.

## 2020-05-14 IMAGING — DX DG CHEST 1V PORT
1 series · 1 of 1 positions shown · non-contrast
Comparison: 11/20/2018

CLINICAL DATA: Status post left ventricular assist device placement
on 11/17/2018.

EXAM:
PORTABLE CHEST 1 VIEW

[chest]
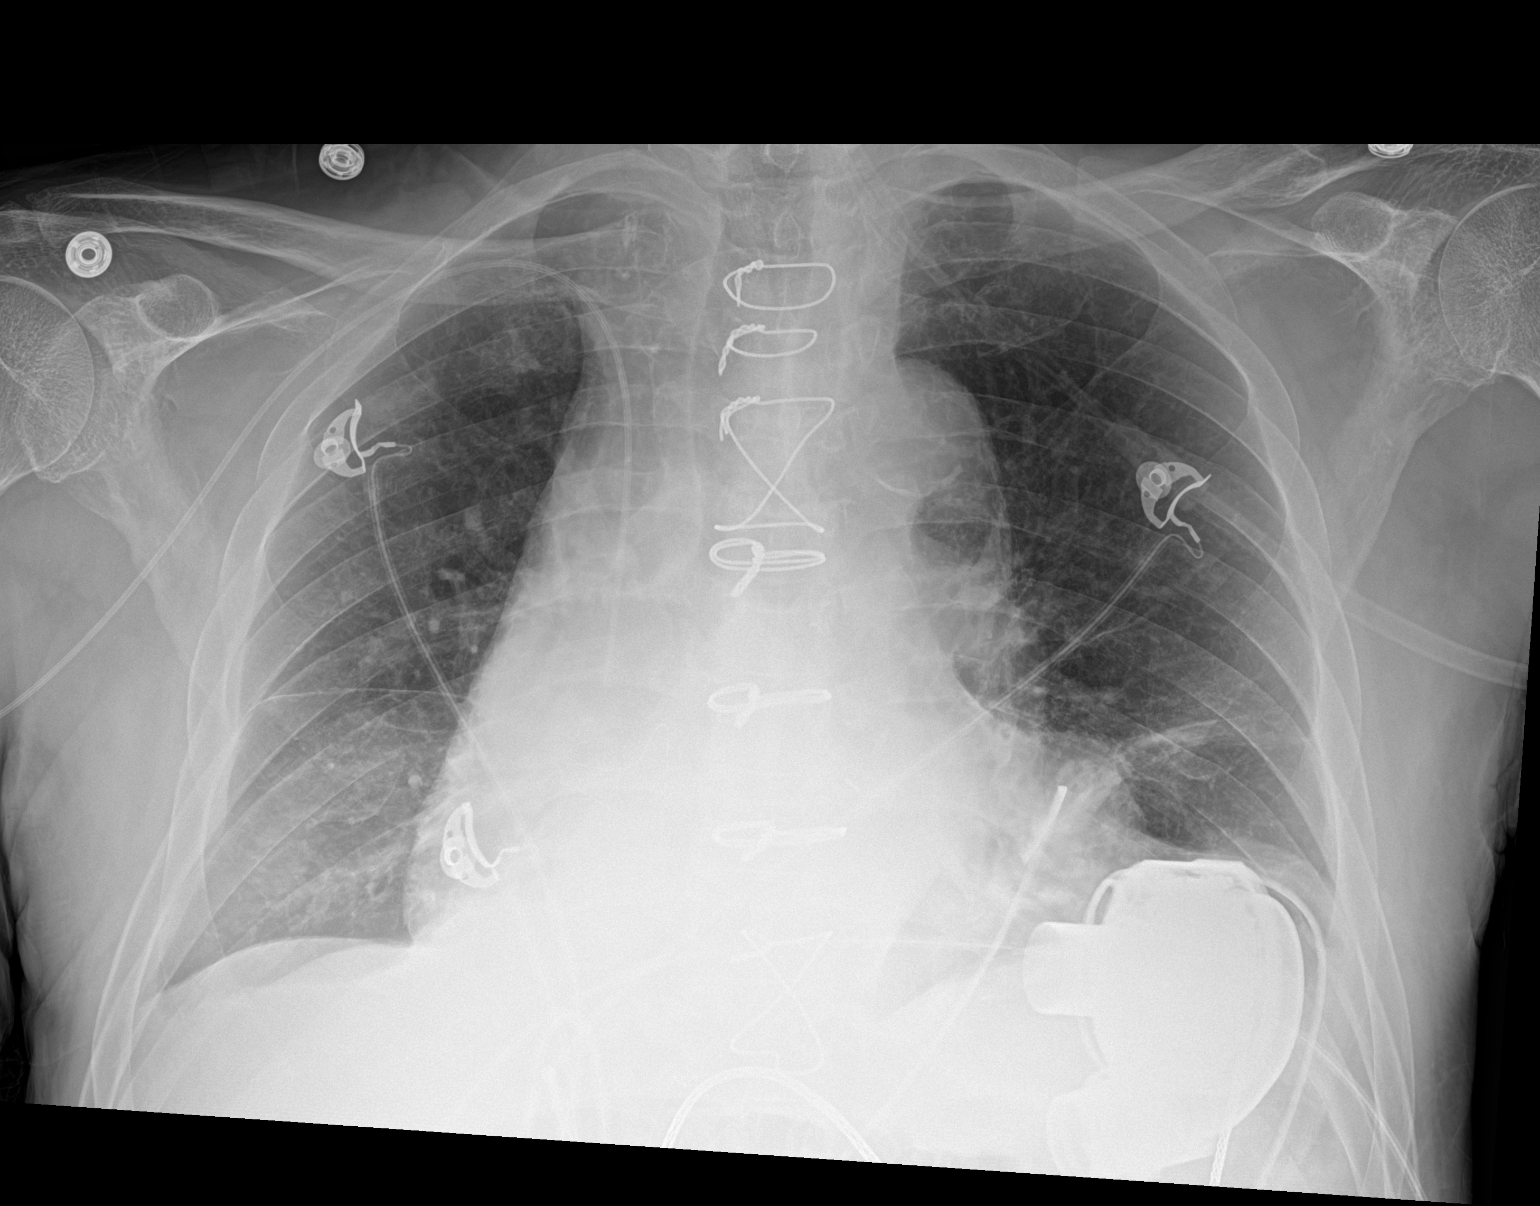

[1 of 1 positions shown; findings below may reference images not displayed]

FINDINGS: The left ventricular assist device is unchanged. Interval right PICC
with its tip in the region of the superior cavoatrial junction. A
small diameter catheter remains projected over the left ventricle.
Stable enlarged cardiac silhouette and tortuous and calcified
thoracic aorta. Median sternotomy wires are again demonstrated. The
right jugular Swan-Ganz catheter sheath has been removed. No
significant change in mild linear atelectasis at the left lung base.
The right lung remains clear. No acute bony abnormality.
IMPRESSION: 1. Stable cardiomegaly and mild left basilar atelectasis.
2. Stable left ventricular assist device with a small diameter
catheter overlying the left ventricle.
3. No acute abnormality.
